# Patient Record
Sex: Female | Born: 1965 | Race: Black or African American | Hispanic: No | Marital: Married | Smoking: Never smoker
Health system: Southern US, Community
[De-identification: ages and names within clinical notes are randomized; demographics above are authoritative.]

## PROBLEM LIST (undated history)

## (undated) ENCOUNTER — Emergency Department (HOSPITAL_COMMUNITY): Admission: EM | Payer: Self-pay | Source: Home / Self Care

## (undated) DIAGNOSIS — J189 Pneumonia, unspecified organism: Secondary | ICD-10-CM

## (undated) DIAGNOSIS — I499 Cardiac arrhythmia, unspecified: Secondary | ICD-10-CM

## (undated) DIAGNOSIS — M069 Rheumatoid arthritis, unspecified: Secondary | ICD-10-CM

## (undated) DIAGNOSIS — R918 Other nonspecific abnormal finding of lung field: Secondary | ICD-10-CM

## (undated) DIAGNOSIS — I10 Essential (primary) hypertension: Secondary | ICD-10-CM

## (undated) DIAGNOSIS — J982 Interstitial emphysema: Secondary | ICD-10-CM

## (undated) DIAGNOSIS — M0520 Rheumatoid vasculitis with rheumatoid arthritis of unspecified site: Secondary | ICD-10-CM

## (undated) DIAGNOSIS — Z8619 Personal history of other infectious and parasitic diseases: Secondary | ICD-10-CM

## (undated) DIAGNOSIS — L568 Other specified acute skin changes due to ultraviolet radiation: Secondary | ICD-10-CM

## (undated) DIAGNOSIS — E042 Nontoxic multinodular goiter: Secondary | ICD-10-CM

## (undated) DIAGNOSIS — L959 Vasculitis limited to the skin, unspecified: Secondary | ICD-10-CM

## (undated) HISTORY — PX: ABDOMINAL HYSTERECTOMY: SHX81

## (undated) HISTORY — DX: Nontoxic multinodular goiter: E04.2

## (undated) HISTORY — DX: Essential (primary) hypertension: I10

## (undated) HISTORY — DX: Personal history of other infectious and parasitic diseases: Z86.19

---

## 1998-07-16 ENCOUNTER — Other Ambulatory Visit: Admission: RE | Admit: 1998-07-16 | Discharge: 1998-07-16 | Payer: Self-pay | Admitting: Obstetrics and Gynecology

## 1999-09-02 ENCOUNTER — Other Ambulatory Visit: Admission: RE | Admit: 1999-09-02 | Discharge: 1999-09-02 | Payer: Self-pay | Admitting: Obstetrics and Gynecology

## 2000-10-30 ENCOUNTER — Other Ambulatory Visit: Admission: RE | Admit: 2000-10-30 | Discharge: 2000-10-30 | Payer: Self-pay | Admitting: Obstetrics and Gynecology

## 2000-11-15 ENCOUNTER — Ambulatory Visit (HOSPITAL_COMMUNITY): Admission: RE | Admit: 2000-11-15 | Discharge: 2000-11-15 | Payer: Self-pay | Admitting: Obstetrics and Gynecology

## 2000-11-15 ENCOUNTER — Encounter: Payer: Self-pay | Admitting: Obstetrics and Gynecology

## 2002-01-21 ENCOUNTER — Other Ambulatory Visit: Admission: RE | Admit: 2002-01-21 | Discharge: 2002-01-21 | Payer: Self-pay | Admitting: Obstetrics and Gynecology

## 2002-07-12 ENCOUNTER — Encounter: Payer: Self-pay | Admitting: Emergency Medicine

## 2002-07-12 ENCOUNTER — Emergency Department (HOSPITAL_COMMUNITY): Admission: EM | Admit: 2002-07-12 | Discharge: 2002-07-12 | Payer: Self-pay | Admitting: Emergency Medicine

## 2002-11-21 ENCOUNTER — Encounter: Payer: Self-pay | Admitting: Internal Medicine

## 2002-11-21 ENCOUNTER — Encounter: Admission: RE | Admit: 2002-11-21 | Discharge: 2002-11-21 | Payer: Self-pay | Admitting: Internal Medicine

## 2003-04-23 ENCOUNTER — Encounter (INDEPENDENT_AMBULATORY_CARE_PROVIDER_SITE_OTHER): Payer: Self-pay | Admitting: Specialist

## 2003-04-23 ENCOUNTER — Ambulatory Visit (HOSPITAL_COMMUNITY): Admission: RE | Admit: 2003-04-23 | Discharge: 2003-04-23 | Payer: Self-pay | Admitting: Obstetrics and Gynecology

## 2003-05-07 ENCOUNTER — Other Ambulatory Visit: Admission: RE | Admit: 2003-05-07 | Discharge: 2003-05-07 | Payer: Self-pay | Admitting: Obstetrics and Gynecology

## 2003-11-25 ENCOUNTER — Encounter: Admission: RE | Admit: 2003-11-25 | Discharge: 2003-11-25 | Payer: Self-pay | Admitting: Gynecology

## 2004-02-18 ENCOUNTER — Ambulatory Visit: Payer: Self-pay | Admitting: Internal Medicine

## 2004-05-18 ENCOUNTER — Ambulatory Visit: Payer: Self-pay | Admitting: Internal Medicine

## 2004-06-03 ENCOUNTER — Other Ambulatory Visit: Admission: RE | Admit: 2004-06-03 | Discharge: 2004-06-03 | Payer: Self-pay | Admitting: Obstetrics and Gynecology

## 2004-09-12 ENCOUNTER — Observation Stay (HOSPITAL_COMMUNITY): Admission: RE | Admit: 2004-09-12 | Discharge: 2004-09-13 | Payer: Self-pay | Admitting: Obstetrics and Gynecology

## 2004-09-12 ENCOUNTER — Encounter (INDEPENDENT_AMBULATORY_CARE_PROVIDER_SITE_OTHER): Payer: Self-pay | Admitting: *Deleted

## 2004-11-30 ENCOUNTER — Encounter: Admission: RE | Admit: 2004-11-30 | Discharge: 2004-11-30 | Payer: Self-pay | Admitting: Obstetrics and Gynecology

## 2005-03-14 ENCOUNTER — Ambulatory Visit: Payer: Self-pay | Admitting: Internal Medicine

## 2005-03-20 ENCOUNTER — Ambulatory Visit: Payer: Self-pay | Admitting: Internal Medicine

## 2005-03-31 ENCOUNTER — Ambulatory Visit (HOSPITAL_COMMUNITY): Admission: RE | Admit: 2005-03-31 | Discharge: 2005-03-31 | Payer: Self-pay | Admitting: Internal Medicine

## 2005-12-05 ENCOUNTER — Encounter: Admission: RE | Admit: 2005-12-05 | Discharge: 2005-12-05 | Payer: Self-pay | Admitting: Obstetrics and Gynecology

## 2006-03-29 ENCOUNTER — Ambulatory Visit: Payer: Self-pay | Admitting: Internal Medicine

## 2006-03-29 LAB — CONVERTED CEMR LAB
AST: 20 units/L (ref 0–37)
Albumin: 3.8 g/dL (ref 3.5–5.2)
Basophils Absolute: 0.1 10*3/uL (ref 0.0–0.1)
Bilirubin, Direct: 0.1 mg/dL (ref 0.0–0.3)
Cholesterol: 124 mg/dL (ref 0–200)
Eosinophils Absolute: 0 10*3/uL (ref 0.0–0.6)
Eosinophils Relative: 0.3 % (ref 0.0–5.0)
GFR calc Af Amer: 102 mL/min
GFR calc non Af Amer: 84 mL/min
Glucose, Bld: 83 mg/dL (ref 70–99)
HCT: 41.8 % (ref 36.0–46.0)
HDL: 42.7 mg/dL (ref >39.0)
Ketones, ur: NEGATIVE mg/dL
LDL Cholesterol: 72 mg/dL (ref 0–99)
Lymphocytes Relative: 22.2 % (ref 12.0–46.0)
MCHC: 35.7 g/dL (ref 30.0–36.0)
MCV: 91.1 fL (ref 78.0–100.0)
Neutro Abs: 6.3 10*3/uL (ref 1.4–7.7)
Neutrophils Relative %: 70.8 % (ref 43.0–77.0)
Nitrite: NEGATIVE
Potassium: 3.8 meq/L (ref 3.5–5.1)
Sodium: 140 meq/L (ref 135–145)
TSH: 0.81 microintl units/mL (ref 0.35–5.50)
Total CHOL/HDL Ratio: 2.9
Urine Glucose: NEGATIVE mg/dL
Urobilinogen, UA: 0.2 (ref 0.0–1.0)
WBC: 8.9 10*3/uL (ref 4.5–10.5)

## 2006-04-04 ENCOUNTER — Ambulatory Visit: Payer: Self-pay | Admitting: Internal Medicine

## 2006-05-11 ENCOUNTER — Ambulatory Visit: Payer: Self-pay | Admitting: Endocrinology

## 2006-05-11 LAB — CONVERTED CEMR LAB
BUN: 10 mg/dL (ref 6–23)
Basophils Relative: 0 % (ref 0.0–1.0)
CO2: 30 meq/L (ref 19–32)
Creatinine, Ser: 0.8 mg/dL (ref 0.4–1.2)
Eosinophils Relative: 0.5 % (ref 0.0–5.0)
Glucose, Bld: 89 mg/dL (ref 70–99)
HCT: 46.2 % — ABNORMAL HIGH (ref 36.0–46.0)
Hemoglobin: 15.7 g/dL — ABNORMAL HIGH (ref 12.0–15.0)
Monocytes Absolute: 0.4 10*3/uL (ref 0.2–0.7)
Monocytes Relative: 5.2 % (ref 3.0–11.0)
Potassium: 3.6 meq/L (ref 3.5–5.1)
RDW: 12.6 % (ref 11.5–14.6)
WBC: 8.6 10*3/uL (ref 4.5–10.5)

## 2006-10-09 ENCOUNTER — Ambulatory Visit: Payer: Self-pay | Admitting: Internal Medicine

## 2006-11-08 ENCOUNTER — Encounter: Payer: Self-pay | Admitting: *Deleted

## 2006-11-08 DIAGNOSIS — I10 Essential (primary) hypertension: Secondary | ICD-10-CM

## 2006-12-10 ENCOUNTER — Encounter: Admission: RE | Admit: 2006-12-10 | Discharge: 2006-12-10 | Payer: Self-pay | Admitting: Obstetrics and Gynecology

## 2007-04-18 ENCOUNTER — Ambulatory Visit: Payer: Self-pay | Admitting: Internal Medicine

## 2007-04-19 LAB — CONVERTED CEMR LAB
ALT: 22 units/L (ref 0–35)
AST: 19 units/L (ref 0–37)
Albumin: 3.8 g/dL (ref 3.5–5.2)
Basophils Absolute: 0 10*3/uL (ref 0.0–0.1)
Calcium: 9 mg/dL (ref 8.4–10.5)
Chloride: 107 meq/L (ref 96–112)
Cholesterol: 146 mg/dL (ref 0–200)
Eosinophils Absolute: 0 10*3/uL (ref 0.0–0.6)
GFR calc non Af Amer: 98 mL/min
HDL: 43.4 mg/dL (ref >39.0)
Hemoglobin, Urine: NEGATIVE
Ketones, ur: NEGATIVE mg/dL
LDL Cholesterol: 95 mg/dL (ref 0–99)
MCHC: 32.7 g/dL (ref 30.0–36.0)
MCV: 93.1 fL (ref 78.0–100.0)
Nitrite: NEGATIVE
Platelets: 209 10*3/uL (ref 150–400)
RBC: 5.03 M/uL (ref 3.87–5.11)
Sodium: 140 meq/L (ref 135–145)
TSH: 0.79 microintl units/mL (ref 0.35–5.50)
Total CHOL/HDL Ratio: 3.4
Urine Glucose: NEGATIVE mg/dL

## 2007-04-24 ENCOUNTER — Ambulatory Visit: Payer: Self-pay | Admitting: Internal Medicine

## 2007-04-24 DIAGNOSIS — E041 Nontoxic single thyroid nodule: Secondary | ICD-10-CM

## 2007-05-03 ENCOUNTER — Encounter: Admission: RE | Admit: 2007-05-03 | Discharge: 2007-05-03 | Payer: Self-pay | Admitting: Internal Medicine

## 2007-06-06 ENCOUNTER — Telehealth: Payer: Self-pay | Admitting: Internal Medicine

## 2007-06-21 ENCOUNTER — Ambulatory Visit: Payer: Self-pay | Admitting: Internal Medicine

## 2007-06-21 DIAGNOSIS — R05 Cough: Secondary | ICD-10-CM

## 2007-10-04 ENCOUNTER — Telehealth: Payer: Self-pay | Admitting: Internal Medicine

## 2007-10-04 ENCOUNTER — Ambulatory Visit: Payer: Self-pay | Admitting: Internal Medicine

## 2007-10-14 LAB — CONVERTED CEMR LAB
Ketones, ur: NEGATIVE mg/dL
Mucus, UA: NEGATIVE
RBC / HPF: NONE SEEN
Specific Gravity, Urine: 1.02 (ref 1.000–1.03)
pH: 6 (ref 5.0–8.0)

## 2007-10-23 ENCOUNTER — Ambulatory Visit: Payer: Self-pay | Admitting: Internal Medicine

## 2007-12-17 ENCOUNTER — Encounter: Admission: RE | Admit: 2007-12-17 | Discharge: 2007-12-17 | Payer: Self-pay | Admitting: Obstetrics and Gynecology

## 2008-04-23 ENCOUNTER — Ambulatory Visit: Payer: Self-pay | Admitting: Internal Medicine

## 2008-04-23 LAB — CONVERTED CEMR LAB
Basophils Absolute: 0 10*3/uL (ref 0.0–0.1)
Basophils Relative: 0.1 % (ref 0.0–3.0)
Bilirubin Urine: NEGATIVE
Bilirubin, Direct: 0.2 mg/dL (ref 0.0–0.3)
Calcium: 8.7 mg/dL (ref 8.4–10.5)
Cholesterol: 135 mg/dL (ref 0–200)
Creatinine, Ser: 0.7 mg/dL (ref 0.4–1.2)
GFR calc Af Amer: 117 mL/min
HCT: 44.3 % (ref 36.0–46.0)
HDL: 37.2 mg/dL — ABNORMAL LOW (ref >39.0)
Hemoglobin: 14.9 g/dL (ref 12.0–15.0)
Ketones, ur: NEGATIVE mg/dL
MCHC: 33.5 g/dL (ref 30.0–36.0)
MCV: 93.2 fL (ref 78.0–100.0)
Monocytes Absolute: 0.4 10*3/uL (ref 0.1–1.0)
Neutro Abs: 6.1 10*3/uL (ref 1.4–7.7)
RDW: 12.9 % (ref 11.5–14.6)
Sodium: 140 meq/L (ref 135–145)
TSH: 0.76 microintl units/mL (ref 0.35–5.50)
Total Bilirubin: 1 mg/dL (ref 0.3–1.2)
Total CHOL/HDL Ratio: 3.6
Total Protein: 6.7 g/dL (ref 6.0–8.3)
Triglycerides: 50 mg/dL (ref 0–149)
Urine Glucose: NEGATIVE mg/dL
pH: 6.5 (ref 5.0–8.0)

## 2008-04-27 ENCOUNTER — Ambulatory Visit: Payer: Self-pay | Admitting: Internal Medicine

## 2008-06-02 ENCOUNTER — Telehealth: Payer: Self-pay | Admitting: Internal Medicine

## 2008-12-17 ENCOUNTER — Encounter: Admission: RE | Admit: 2008-12-17 | Discharge: 2008-12-17 | Payer: Self-pay | Admitting: Obstetrics and Gynecology

## 2009-05-04 ENCOUNTER — Ambulatory Visit: Payer: Self-pay | Admitting: Internal Medicine

## 2009-05-04 LAB — CONVERTED CEMR LAB
ALT: 17 units/L (ref 0–35)
AST: 17 units/L (ref 0–37)
Albumin: 3.9 g/dL (ref 3.5–5.2)
Alkaline Phosphatase: 68 units/L (ref 39–117)
Basophils Absolute: 0.2 10*3/uL — ABNORMAL HIGH (ref 0.0–0.1)
Basophils Relative: 1.9 % (ref 0.0–3.0)
Bilirubin Urine: NEGATIVE
Calcium: 9 mg/dL (ref 8.4–10.5)
Eosinophils Relative: 0.9 % (ref 0.0–5.0)
GFR calc non Af Amer: 116.37 mL/min (ref >60)
Glucose, Bld: 85 mg/dL (ref 70–99)
HCT: 45.4 % (ref 36.0–46.0)
HDL: 46.2 mg/dL (ref >39.00)
Hemoglobin: 15.1 g/dL — ABNORMAL HIGH (ref 12.0–15.0)
Leukocytes, UA: NEGATIVE
Lymphocytes Relative: 18 % (ref 12.0–46.0)
Lymphs Abs: 1.7 10*3/uL (ref 0.7–4.0)
Monocytes Relative: 2.9 % — ABNORMAL LOW (ref 3.0–12.0)
Neutro Abs: 7.4 10*3/uL (ref 1.4–7.7)
Nitrite: NEGATIVE
Potassium: 3.9 meq/L (ref 3.5–5.1)
RBC: 4.82 M/uL (ref 3.87–5.11)
RDW: 12.7 % (ref 11.5–14.6)
Sodium: 138 meq/L (ref 135–145)
Specific Gravity, Urine: >=1.03 (ref 1.000–1.030)
TSH: 0.88 microintl units/mL (ref 0.35–5.50)
Total CHOL/HDL Ratio: 3
Total Protein, Urine: NEGATIVE mg/dL
Total Protein: 7.2 g/dL (ref 6.0–8.3)
pH: 6 (ref 5.0–8.0)

## 2009-05-10 ENCOUNTER — Ambulatory Visit: Payer: Self-pay | Admitting: Internal Medicine

## 2009-05-10 DIAGNOSIS — K5909 Other constipation: Secondary | ICD-10-CM

## 2009-08-12 ENCOUNTER — Telehealth: Payer: Self-pay | Admitting: Internal Medicine

## 2009-09-06 ENCOUNTER — Ambulatory Visit: Payer: Self-pay | Admitting: Internal Medicine

## 2009-09-20 ENCOUNTER — Telehealth: Payer: Self-pay | Admitting: Internal Medicine

## 2009-11-10 ENCOUNTER — Ambulatory Visit: Payer: Self-pay | Admitting: Internal Medicine

## 2009-11-10 DIAGNOSIS — L723 Sebaceous cyst: Secondary | ICD-10-CM

## 2009-11-10 DIAGNOSIS — H9209 Otalgia, unspecified ear: Secondary | ICD-10-CM | POA: Insufficient documentation

## 2010-01-13 ENCOUNTER — Encounter: Admission: RE | Admit: 2010-01-13 | Discharge: 2010-01-13 | Payer: Self-pay | Admitting: Obstetrics and Gynecology

## 2010-03-15 NOTE — Assessment & Plan Note (Signed)
Summary: knot on bck/#/cd   Vital Signs:  Patient profile:   45 year old female Height:      68 inches Weight:      156 pounds BMI:     23.81 Temp:     98.2 degrees F oral Pulse rate:   88 / minute Pulse rhythm:   regular Resp:     16 per minute BP sitting:   130 / 98  (left arm) Cuff size:   regular  Vitals Entered By: Lanier Prude, CMA(AAMA) (November 10, 2009 11:20 AM) CC: Cyst on back/Rt ear pain X 4wks Is Patient Diabetic? No   CC:  Cyst on back/Rt ear pain X 4wks.  History of Present Illness: C/o swollen cyst on L shoulder blade C/o R earache after she used a q tip and injured ear after she turned her head sharply 1-2 wks ago F/u HTN  Current Medications (verified): 1)  Amlodipine Besylate 5 Mg  Tabs (Amlodipine Besylate) .[name].. 1 By Mouth Every Day 2)  Vitamin D3 1000 Unit  Tabs (Cholecalciferol) .[name].. 1 By Mouth Daily 3)  Triamc 0.5% Cream in Eucerin Lotion 1:10 .... Use Two Times A Day Prn 4)  Amitiza 24 Mcg Caps (Lubiprostone) .[name].. 1 By Mouth Once Daily As Needed Constipation 5)  Valtrex 500 Mg Tabs (Valacyclovir Hcl) .[name].. 1 By Mouth Two Times A Day Prn 6)  Symbicort 160-4.5 Mcg/act Aero (Budesonide-Formoterol Fumarate) .... 2 Inh Two Times A Day 7)  Losartan Potassium 100 Mg Tabs (Losartan Potassium) .[name].. 1 By Mouth Once Daily For Blood Pressure  Allergies (verified): 1)  ! 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    Physical Exam  General:  Well-developed,well-nourished,in no acute distress; alert,appropriate and cooperative throughout examination Head:  Normocephalic and atraumatic without obvious abnormalities. No apparent alopecia or  balding. Ears:  wax in R R TM with a healing abrasion at 7 o'clock Nose:  External nasal examination shows no deformity or inflammation. Nasal mucosa are pink and moist without lesions or exudates. Mouth:  Oral mucosa and oropharynx without lesions or exudates.  Teeth in good repair. Lungs:  Normal respiratory effort, chest expands symmetrically. Lungs are clear to auscultation, no crackles or wheezes. Heart:  Normal rate and regular rhythm. S1 and S2 normal without gallop, murmur, click, rub or other extra sounds. Abdomen:  Bowel sounds positive,abdomen soft and non-tender without masses, organomegaly or hernias noted. Msk:  WNL Extremities:  No clubbing, cyanosis, edema, or deformity noted with normal full range of motion of all joints.   Neurologic:  No cranial nerve deficits noted. Station and gait are normal. Plantar reflexes are down-going bilaterally. DTRs are symmetrical throughout. Sensory, motor and coordinative functions appear intact. Skin:  1.5 cm tender seb cyst over L scapula, no d/c Psych:  Cognition and judgment appear intact. Alert and cooperative with normal attention span and concentration. No apparent delusions, illusions, hallucinations   Impression & Recommendations:  Problem # 1:  EAR PAIN (ICD-388.70) R  Assessment New Wax removed w/a loop Her updated medication list for this problem includes:    Ceftin 500 Mg Tabs (Cefuroxime axetil) .[name]... 1 by mouth bid  Problem # 2:  SEBACEOUS CYST, INFECTED (ICD-706.2) Assessment: Deteriorated No need  for I&D now Orders: Surgical Referral (Surgery)  Problem # 3:  HYPERTENSION (ICD-401.9) Assessment: Improved  Her updated medication list for this problem includes:    Amlodipine Besylate 5 Mg Tabs (Amlodipine besylate) .[name]... 1 by mouth every day    Losartan Potassium 100 Mg Tabs (Losartan potassium) .[name]... 1 by mouth once daily for blood pressure  Complete Medication List: 1)  Amlodipine Besylate 5 Mg Tabs (Amlodipine  besylate) .[name].. 1 by mouth every day 2)  Vitamin D3 1000 Unit Tabs (Cholecalciferol) .[name].. 1 by mouth daily 3)  Triamc 0.5% Cream in Eucerin Lotion 1:10  .... Use two times a day prn 4)  Amitiza 24 Mcg Caps (Lubiprostone) .[name].. 1 by mouth once daily as needed constipation 5)  Valtrex 500 Mg Tabs (Valacyclovir hcl) .[name].. 1 by mouth two times a day prn 6)  Symbicort 160-4.5 Mcg/act Aero (Budesonide-formoterol fumarate) .... 2 inh two times a day 7)  Losartan Potassium 100 Mg Tabs (Losartan potassium) .[name].. 1 by mouth once daily for blood pressure 8)  Ceftin 500 Mg Tabs (Cefuroxime axetil) .[name].. 1 by mouth bid  Patient Instructions: 1)  Keep return office visit  Prescriptions: CEFTIN 500 MG TABS (CEFUROXIME AXETIL) 1 by mouth bid  #20 x 1   Entered and Authorized by:   Tresa Garter MD   Signed by:   Tresa Garter MD on 11/10/2009   Method used:   Electronically to        [facility]* (retail)       [address]       [place], [place]  [address]       Ph: [phone]       Fax: [phone]   RxID:   [id number]

## 2010-03-15 NOTE — Progress Notes (Signed)
Summary: HCTZ--lm 8-9, 8-10, 8-12  Phone Note Other Incoming   Caller: pt Summary of Call: Pt called stating that her HCTZ medication is making her dizzy. Please Advise another alternative for this pt.  Initial call taken by: Ami Bullins CMA,  September 20, 2009 9:19 AM  Follow-up for Phone Call        D/c HCTZ Start Losartan 100 mg: take 1/2 tab x 1 wk then 1 qd Follow-up by: Tresa Garter MD,  September 21, 2009 7:55 AM  Additional Follow-up for Phone Call Additional follow up Details #1::        left detailed vm on hm #, also req she call back w/confirmation that she understands Additional Follow-up by: Lamar Sprinkles, CMA,  September 21, 2009 9:55 AM   New Allergies: HYDROCHLOROTHIAZIDE Additional Follow-up for Phone Call Additional follow up Details #2::    left mess for female at pt's home to have pt call back tomorrow...........Marland KitchenLamar Sprinkles, CMA  September 22, 2009 5:45 PM   left message on machine to call back to office. Lucious Groves CMA  September 24, 2009 8:43 AM   Additional Follow-up for Phone Call Additional follow up Details #3:: Details for Additional Follow-up Action Taken: Per pharmacy pt picked up Rx 09/20/2009 and was instructed by pharmacist to slowly increase to 1 by mouth once daily. Pharmacist advised of this because pt was unsure and this was not included on directions. Pharmacy was advised that several messages were left for pt to call back to be instructed of same. Additional Follow-up by: Margaret Pyle, CMA,  September 24, 2009 3:52 PM  New/Updated Medications: LOSARTAN POTASSIUM 100 MG TABS (LOSARTAN POTASSIUM) 1 by mouth once daily for blood pressure New Allergies: HYDROCHLOROTHIAZIDEPrescriptions: LOSARTAN POTASSIUM 100 MG TABS (LOSARTAN POTASSIUM) 1 by mouth once daily for blood pressure  #30 x 12   Entered and Authorized by:   Tresa Garter MD   Signed by:   Tresa Garter MD on 09/21/2009   Method used:   Electronically to        [facility]* (retail)       [address]       [place], [place]  [address]       Ph: [phone]       Fax: [phone]   RxID:   [id number]

## 2010-03-15 NOTE — Progress Notes (Signed)
Summary: REFILL  Phone Note Call from Patient Call back at [phone]   Summary of Call: pt left message on triage asking if she can get a refill for promethazine codeine cough syrup or if she needs to make an OV to come in--please advise--pt states to call her back at number listed above or [phone] after 2pm Initial call taken by: Brenton Grills MA,  August 12, 2009 1:29 PM  Follow-up for Phone Call        ok to ref OV if sick Follow-up by: Tresa Garter MD,  August 13, 2009 4:08 PM  Additional Follow-up for Phone Call Additional follow up Details #1::        Pt informed  Additional Follow-up by: Lamar Sprinkles, CMA,  August 13, 2009 4:31 PM    New/Updated Medications: PROMETHAZINE-CODEINE 6.25-10 MG/5ML SYRP (PROMETHAZINE-CODEINE) 5 to 10 mls every 4 to 6 hrs as needed cough Prescriptions: PROMETHAZINE-CODEINE 6.25-10 MG/5ML SYRP (PROMETHAZINE-CODEINE) 5 to 10 mls every 4 to 6 hrs as needed cough  #300 x 0   Entered by:   Lamar Sprinkles, CMA   Authorized by:   Tresa Garter MD   Signed by:   Lamar Sprinkles, CMA on 08/13/2009   Method used:   Telephoned to ...       [facility]* (retail)       [address]       [place], [place]  [address]       Ph: [phone]       Fax: [phone]   RxID:   [id number]

## 2010-03-15 NOTE — Assessment & Plan Note (Signed)
Summary: PHYSICAL---STC  #   Vital Signs:  Patient profile:   45 year old female Height:      68 inches Weight:      159 pounds BMI:     24.26 Temp:     97.1 degrees F oral Pulse rate:   97 / minute BP sitting:   130 / 92  (left arm)  Vitals Entered By: Tora Perches (May 10, 2009 2:05 PM) CC: cpx Is Patient Diabetic? No   CC:  cpx.  History of Present Illness: The patient presents for a wellness examination   Preventive Screening-Counseling & Management  Alcohol-Tobacco     Smoking Status: never  Current Medications (verified): 1)  Amlodipine Besylate 5 Mg  Tabs (Amlodipine Besylate) .[name].. 1 By Mouth Every Day 2)  Vitamin D3 1000 Unit  Tabs (Cholecalciferol) .[name].. 1 By Mouth Daily 3)  Spironolactone-Hctz 25-25 Mg Tabs (Spironolactone-Hctz) .[name].. 1 By Mouth Qd 4)  Advair Diskus 100-50 Mcg/dose Misc (Fluticasone-Salmeterol) .[name].. 1 Puff 2 Times Daily 5)  Triamc 0.5% Cream in Eucerin Lotion 1:10 .... Use Two Times A Day Prn  Allergies: 1)  ! Cipro 2)  Benazepril Hcl (Benazepril Hcl)  Past History:  Past Medical History: Last updated: 04/24/2007 Hypertension Cold sores Thyroid nodules 2007 GYN Dr Henderson Cloud  Past Surgical History: Last updated: 04/24/2007 Hysterectomy partial  Family History: Last updated: 04/24/2007 Family History Hypertension  Social History: Last updated: 05/10/2009 Occupation:hospital Married Never Smoked Regular exercise-no  Social History: Occupation:hospital Married Never Smoked Regular exercise-no  Review of Systems  The patient denies anorexia, fever, weight loss, weight gain, vision loss, decreased hearing, hoarseness, chest pain, syncope, dyspnea on exertion, peripheral edema, prolonged cough, headaches, hemoptysis, abdominal pain, melena, hematochezia, severe indigestion/heartburn, hematuria, incontinence, genital sores, muscle weakness, suspicious skin lesions, transient blindness, difficulty walking, depression, unusual  weight change, abnormal bleeding, enlarged lymph nodes, angioedema, and breast masses.    Physical Exam  General:  Well-developed,well-nourished,in no acute distress; alert,appropriate and cooperative throughout examination Head:  Normocephalic and atraumatic without obvious abnormalities. No apparent alopecia or balding. Eyes:  No corneal or conjunctival inflammation noted. EOMI. Perrla. Funduscopic exam benign, without hemorrhages, exudates or papilledema. Vision grossly normal. Ears:  External ear exam shows no significant lesions or deformities.  Otoscopic examination reveals clear canals, tympanic membranes are intact bilaterally without bulging, retraction, inflammation or discharge. Hearing is grossly normal bilaterally. Nose:  External nasal examination shows no deformity or inflammation. Nasal mucosa are pink and moist without lesions or exudates. Mouth:  Oral mucosa and oropharynx without lesions or exudates.  Teeth in good repair. Neck:  R lobe of thyroid  Lungs:  Normal respiratory effort, chest expands symmetrically. Lungs are clear to auscultation, no crackles or wheezes. Heart:  Normal rate and regular rhythm. S1 and S2 normal without gallop, murmur, click, rub or other extra sounds. Abdomen:  Bowel sounds positive,abdomen soft and non-tender without masses, organomegaly or hernias noted. Msk:  WNL Pulses:  R and L carotid,radial,femoral,dorsalis pedis and posterior tibial pulses are full and equal bilaterally Extremities:  No clubbing, cyanosis, edema, or deformity noted with normal full range of motion of all joints.   Neurologic:  No cranial nerve deficits noted. Station and gait are normal. Plantar reflexes are down-going bilaterally. DTRs are symmetrical throughout. Sensory, motor and coordinative functions appear intact. Skin:  Intact without suspicious lesions or rashes Cervical Nodes:  No lymphadenopathy noted Inguinal Nodes:  No significant adenopathy Psych:  Cognition  and judgment appear intact. Alert and cooperative with  normal attention span and concentration. No apparent delusions, illusions, hallucinations   Impression & Recommendations:  Problem # 1:  ROUTINE GENERAL MEDICAL EXAM@HEALTH  CARE FACL (ICD-V70.0) Assessment New Health and age related issues were discussed. Available screening tests and vaccinations were discussed as well. Healthy life style including good diet and execise was discussed. The labs were reviewed with the patient.  Orders: EKG w/ Interpretation (93000)  Problem # 2:  Cold sore Assessment: Comment Only Valtrex  Problem # 3:  CONSTIPATION, CHRONIC (ICD-564.09) Assessment: Unchanged Amitiza See "Patient Instructions".   Complete Medication List: 1)  Amlodipine Besylate 5 Mg Tabs (Amlodipine besylate) .[name].. 1 by mouth every day 2)  Vitamin D3 1000 Unit Tabs (Cholecalciferol) .[name].. 1 by mouth daily 3)  Spironolactone-hctz 25-25 Mg Tabs (Spironolactone-hctz) .[name].. 1 by mouth qd 4)  Advair Diskus 100-50 Mcg/dose Misc (Fluticasone-salmeterol) .[name].. 1 puff 2 times daily 5)  Triamc 0.5% Cream in Eucerin Lotion 1:10  .... Use two times a day prn 6)  Amitiza 24 Mcg Caps (Lubiprostone) .[name].. 1 by mouth once daily as needed constipation 7)  Valtrex 500 Mg Tabs (Valacyclovir hcl) .[name].. 1 by mouth two times a day prn  Patient Instructions: 1)  Please schedule a follow-up appointment in 1 year. 2)  Try to eat more raw plant food, fresh and dry fruit, raw almonds, leafy vegetables, whole foods and less red meat, less animal fat. Poultry and fish is better for you than pork and beef. Avoid processed foods (canned soups, hot dogs, sausage, bacon , frozen dinners). Avoid corn syrup, high fructose syrup or aspartam and Splenda  containing drinks. Honey, Agave and Stevia are better sweeteners. Make your own  dressing with olive oil, wine vinegar, lemon juce, garlic etc. for your salads. 3)  Start taking a yoga or pilates yoga class 4)  It is  important that you exercise regularly at least 20 minutes 5 times a week. If you develop chest pain, have severe difficulty breathing, or feel very tired , stop exercising immediately and seek medical attention. Prescriptions: VALTREX 500 MG TABS (VALACYCLOVIR HCL) 1 by mouth two times a day prn  #14 x 3   Entered and Authorized by:   Tresa Garter MD   Signed by:   Tresa Garter MD on 05/10/2009   Method used:   Print then Give to Patient   RxID:   [id number] AMITIZA 24 MCG CAPS (LUBIPROSTONE) 1 by mouth once daily as needed constipation  #90 x 1   Entered and Authorized by:   Tresa Garter MD   Signed by:   Tresa Garter MD on 05/10/2009   Method used:   Print then Give to Patient   RxID:   [id number] ADVAIR DISKUS 100-50 MCG/DOSE MISC (FLUTICASONE-SALMETEROL) 1 puff 2 times daily  #3 x 3   Entered and Authorized by:   Tresa Garter MD   Signed by:   Tresa Garter MD on 05/10/2009   Method used:   Print then Give to Patient   RxID:   [id number] SPIRONOLACTONE-HCTZ 25-25 MG TABS (SPIRONOLACTONE-HCTZ) 1 by mouth qd  #90 x 3   Entered and Authorized by:   Tresa Garter MD   Signed by:   Tresa Garter MD on 05/10/2009   Method used:   Print then Give to Patient   RxID:   [id number] VITAMIN D3 1000 UNIT  TABS (CHOLECALCIFEROL) 1 by mouth daily  #100 x 3   Entered and Authorized by:  Tresa Garter MD   Signed by:   Tresa Garter MD on 05/10/2009   Method used:   Print then Give to Patient   RxID:   [id number] AMLODIPINE BESYLATE 5 MG  TABS (AMLODIPINE BESYLATE) 1 by mouth every day  #90 x 3   Entered and Authorized by:   Tresa Garter MD   Signed by:   Tresa Garter MD on 05/10/2009   Method used:   Print then Give to Patient   RxID:   [id number]

## 2010-03-15 NOTE — Assessment & Plan Note (Signed)
Summary: COUGH---STC   Vital Signs:  Patient profile:   45 year old female Height:      68 inches Weight:      155 pounds BMI:     23.65 O2 Sat:      98 % on Room air Temp:     98.1 degrees F oral Pulse rate:   97 / minute Pulse rhythm:   regular Resp:     16 per minute BP sitting:   150 / 110  (left arm) Cuff size:   regular  Vitals Entered By: Lanier Prude, CMA(AAMA) (September 06, 2009 3:57 PM)  O2 Flow:  Room air Comments pt states she called 2 wks ago for this appt and had real bad cough at the time.  It has improved some but is still lingering   History of Present Illness: F/u HTN, cough  Current Medications (verified): 1)  Amlodipine Besylate 5 Mg  Tabs (Amlodipine Besylate) .[name].. 1 By Mouth Every Day 2)  Vitamin D3 1000 Unit  Tabs (Cholecalciferol) .[name].. 1 By Mouth Daily 3)  Spironolactone-Hctz 25-25 Mg Tabs (Spironolactone-Hctz) .[name].. 1 By Mouth Qd 4)  Advair Diskus 100-50 Mcg/dose Misc (Fluticasone-Salmeterol) .[name].. 1 Puff 2 Times Daily 5)  Triamc 0.5% Cream in Eucerin Lotion 1:10 .... Use Two Times A Day Prn 6)  Amitiza 24 Mcg Caps (Lubiprostone) .[name].. 1 By Mouth Once Daily As Needed Constipation 7)  Valtrex 500 Mg Tabs (Valacyclovir Hcl) .[name].. 1 By Mouth Two Times A Day Prn 8)  Promethazine-Codeine 6.25-10 Mg/71ml Syrp (Promethazine-Codeine) .... 5 To 10 Mls Every 4 To 6 Hrs As Needed Cough  Allergies (verified): 1)  ! 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    Physical Exam  General:  Well-developed,well-nourished,in no acute distress; alert,appropriate and cooperative throughout examination Mouth:  Oral mucosa and oropharynx without lesions or  exudates.  Teeth in good repair. Lungs:  Normal respiratory effort, chest expands symmetrically. Lungs are clear to auscultation, no crackles or wheezes. Heart:  Normal rate and regular rhythm. S1 and S2 normal without gallop, murmur, click, rub or other extra sounds.   Impression & Recommendations:  Problem # 1:  HYPERTENSION (ICD-401.9) Assessment Deteriorated  Her updated medication list for this problem includes:    Amlodipine Besylate 5 Mg Tabs (Amlodipine besylate) .[name]... 1 by mouth every day    Spironolactone-hctz 25-25 Mg Tabs (Spironolactone-hctz) .[name]... 1 by mouth once daily - pls restart Risks of noncompliance with treatment discussed. Compliance encouraged.  Problem # 2:  COUGH (ICD-786.2) resolved Assessment: Improved MDI as needed if flare up  Complete Medication List: 1)  Amlodipine Besylate 5 Mg Tabs (Amlodipine besylate) .[name].. 1 by mouth every day 2)  Vitamin D3 1000 Unit Tabs (Cholecalciferol) .[name].. 1 by mouth daily 3)  Spironolactone-hctz 25-25 Mg Tabs (Spironolactone-hctz) .[name].. 1 by mouth qd 4)  Triamc 0.5% Cream in Eucerin Lotion 1:10  .... Use two times a day prn 5)  Amitiza 24 Mcg Caps (Lubiprostone) .[name].. 1 by mouth once daily as needed constipation 6)  Valtrex 500 Mg Tabs (Valacyclovir hcl) .[name].. 1 by mouth two times a day prn 7)  Symbicort 160-4.5 Mcg/act Aero (Budesonide-formoterol fumarate) .... 2 inh two times a day  Patient Instructions: 1)  Please schedule a follow-up appointment in 6 months. 2)  Call if BP is up (NL BP<130/85) 3)  BMP prior to visit, ICD-9: 401.1 Prescriptions: SPIRONOLACTONE-HCTZ 25-25 MG TABS (SPIRONOLACTONE-HCTZ) 1 by mouth qd  #90 x 3   Entered and Authorized by:   Tresa Garter MD   Signed by:   Tresa Garter MD on 09/06/2009   Method used:   Print then Give to Patient   RxID:   [id number] AMLODIPINE BESYLATE 5 MG  TABS (AMLODIPINE BESYLATE) 1 by mouth every day  #90 x 3   Entered and Authorized by:   Tresa Garter MD   Signed by:   Tresa Garter MD on 09/06/2009   Method used:   Print then Give to Patient   RxID:   [id number]

## 2010-04-13 ENCOUNTER — Telehealth: Payer: Self-pay | Admitting: Internal Medicine

## 2010-04-21 NOTE — Progress Notes (Signed)
  Phone Note Call from Patient   Summary of Call: URI Initial call taken by: Tresa Garter MD,  April 13, 2010 1:08 PM  Follow-up for Phone Call        Zpac Use over-the-counter medicines for "cold": Tylenol  650mg  or Advil 400mg  every 6 hours  for fever; Delsym or Robutussin for cough. Mucinex  for congestion. Ricola or Halls for sore throat. Office visit if not better or if worse.  Follow-up by: Tresa Garter MD,  April 13, 2010 1:10 PM  Additional Follow-up for Phone Call Additional follow up Details #1::        Called pt to notify per MD/LMVOM to check with pharmacyand call if needed.Alvy Beal Archie CMA  April 13, 2010 4:06 PM     New/Updated Medications: ZITHROMAX Z-PAK 250 MG TABS (AZITHROMYCIN) as dirrected Prescriptions: ZITHROMAX Z-PAK 250 MG TABS (AZITHROMYCIN) as dirrected  #1 x 0   Entered and Authorized by:   Tresa Garter MD   Signed by:   Rock Nephew CMA on 04/13/2010   Method used:   Electronically to        [facility]* (retail)       [address]       [place], [place]  [address]       Ph: [phone]       Fax: [phone]   RxID:   [id number]

## 2010-05-21 ENCOUNTER — Other Ambulatory Visit: Payer: Self-pay | Admitting: Internal Medicine

## 2010-06-07 ENCOUNTER — Ambulatory Visit (INDEPENDENT_AMBULATORY_CARE_PROVIDER_SITE_OTHER): Payer: 59 | Admitting: Internal Medicine

## 2010-06-07 ENCOUNTER — Encounter: Payer: Self-pay | Admitting: Internal Medicine

## 2010-06-07 DIAGNOSIS — H9209 Otalgia, unspecified ear: Secondary | ICD-10-CM

## 2010-06-07 DIAGNOSIS — R51 Headache: Secondary | ICD-10-CM | POA: Insufficient documentation

## 2010-06-07 DIAGNOSIS — R519 Headache, unspecified: Secondary | ICD-10-CM | POA: Insufficient documentation

## 2010-06-07 MED ORDER — AZITHROMYCIN 250 MG PO TABS: ORAL_TABLET | ORAL | Status: AC

## 2010-06-07 MED ORDER — IBUPROFEN 600 MG PO TABS: ORAL_TABLET | ORAL | Status: AC

## 2010-06-07 NOTE — Assessment & Plan Note (Signed)
Zpac 

## 2010-06-07 NOTE — Progress Notes (Signed)
  Subjective:    Patient ID: Jamie [name], female    DOB: 07/05/1965, 45 y.o.   MRN: [id number]  HPI   HPI  C/o URI sx's since [place]. No ST, cough, weakness. Not better with OTC medicines. Actually, the patient is getting worse.C/o R sided HA. R side of nose is burning. Eye and teeth do not hurt. Advil helped. No d/c Review of Systems  Constitutional: Neg for fever, chills and fatigue.  HENT: Positive for congestion, rhinorrhea, sneezing and postnasal drip.   Eyes: Positive for photophobia and pain. Negative for discharge and visual disturbance.  Respiratory: Positive for cough, shortness of breath and wheezing.   Cardiovascular: Positive for chest pain.  Gastrointestinal: Negative for vomiting, abdominal pain, diarrhea and abdominal distention.  Genitourinary: Negative for dysuria and difficulty urinating.  Skin: Negative for rash.  Neurological: Positive for dizziness, weakness and light-headedness.      Review of Systems     Objective:   Physical Exam  Constitutional: She appears well-developed and well-nourished. No distress.  HENT:  Head: Normocephalic.  Right Ear: External ear normal.  Left Ear: External ear normal.  Nose: Nose normal.  Mouth/Throat: Oropharynx is clear and moist.  Eyes: Conjunctivae are normal. Pupils are equal, round, and reactive to light. Right eye exhibits no discharge. Left eye exhibits no discharge.       R TM red R cheek is tender  Neck: Normal range of motion. Neck supple. No JVD present. No tracheal deviation present. No thyromegaly present.  Cardiovascular: Normal rate, regular rhythm and normal heart sounds.   Pulmonary/Chest: No stridor. No respiratory distress. She has no wheezes.  Abdominal: Soft. Bowel sounds are normal. She exhibits no distension and no mass. There is no tenderness. There is no rebound and no guarding.  Musculoskeletal: She exhibits no edema and no tenderness.  Lymphadenopathy:    She has no cervical adenopathy.    Neurological: She displays normal reflexes. No cranial nerve deficit. She exhibits normal muscle tone. Coordination normal.  Skin: No rash noted. No erythema.  Psychiatric: She has a normal mood and affect. Her behavior is normal. Judgment and thought content normal.          Assessment & Plan:  EAR PAIN Zpac

## 2010-07-01 NOTE — Op Note (Signed)
NAME:  Jamie [name], Jamie [name]                         ACCOUNT NO.:  [id number]   MEDICAL RECORD NO.:  [id number]                   PATIENT TYPE:  AMB   LOCATION:  SDC                                  FACILITY:  [facility]   PHYSICIAN:  Guy Sandifer. Arleta Creek, M.D.           DATE OF BIRTH:  1966-01-09   DATE OF PROCEDURE:  04/23/2003  DATE OF DISCHARGE:                                 OPERATIVE REPORT   PREOPERATIVE DIAGNOSIS:  1. Dysmenorrhea.  2. Menometrorrhagia.   POSTOPERATIVE DIAGNOSIS:  1. Endometriosis.  2. Endometrial polyps.   PROCEDURE:  Laparoscopy with ablation of endometriosis, hysteroscopy with  resectoscope and resection of endometrial polyps, dilation and curettage.   SURGEON:  Guy Sandifer. Henderson Cloud, M.D.   ANESTHESIA:  General with endotracheal intubation.   ESTIMATED BLOOD LOSS:  50 mL.   INPUTS AND OUTPUTS:  Sorbitol distending media, 200 mL deficit, with at  least 100 mL of that on the floor.   SPECIMENS:  1. Endometrial mass.  2. Endometrial mass.  3. Endometrial curettings.   INDICATIONS AND CONSENT:  The patient is a 45 year old married black female,  G2, P1, with dysmenorrhea and heavy irregular bleeding.  Details are  dictated in the history and physical.  Laparoscopy, hysteroscopy with  resectoscope, and dilation and curettage is discussed with the patient  preoperatively.  The potential risks and complications have been reviewed  preoperatively including but not limited to infection, uterine perforation,  bowel, bladder or ureteral damage, bleeding requiring transfusion of blood  products with possible transfusion reaction, HIV, and hepatitis acquisition,  DVT, PE, pneumonia, recurrent pain and/or irregular bleeding, hysterectomy,  and laparotomy.  All questions have been answered and consent is signed on  the chart.   FINDINGS:  In the intrauterine cavity are multiple polypoid type masses  approximately 1 to 1.5 cm.  The uterus is normal in size and contour.   The  anterior cul-de-sac and posterior cul-de-sac are normal.  There is a pseudo-  window lateral to the right uterosacral ligament.  The right ovary and tube  are normal.  There is a black implant of endometriosis of the left upper  pelvic sidewall and a small brown implant of endometriosis of the left  ovary.  The left fallopian tube is normal.  There are also some very filmy  adhesions of the anterior cul-de-sac which go down with just gentle blunt  dissection.   PROCEDURE:  The patient was taken to the operating room where she was  identified, placed in the dorsal supine position, and general anesthesia was  induced with endotracheal intubation.  She was then prepped abdominally and  vaginally.  Her bladder was straight catheterized.  She was draped in a  sterile fashion.  Bivalve speculum was placed.  The anterior cervical lip  was injected with 1% Xylocaine and grasped with a single tooth tenaculum.  Paracervical and paracervical block was placed at the 2, 4,  5, 7, 8, and 10  o'clock positions with approximately 20 mL total of 1% plain Xylocaine.  The  cervix was gently progressively dilated to a 33 Pratt dilator and a  resectoscope with a single right angle wire loop is placed in the  endocervical canal and advanced under direct visualization using Sorbitol  distending media.  The above findings were noted.  The polyps were resected  in a simple fashion without difficulty.  Sharp curettage was carried out.  The cavity is clean.  The single tooth tenaculum is replaced with the Hulka  tenaculum.   Attention was turned to the abdomen.  A small infraumbilical incision was  made and a Veress needle placed and the syringe and drop test were normal.  The abdominal cavity was insufflated with approximately 2 liters of gas low  flow with good tympany in the right upper quadrant.  The Veress needle was  removed and the 10/11 disposable trocar sleeve was placed on the first  attempt without  difficulty.  Placement was verified with the laparoscope and  no damage to the surrounding structures was noted.  A small suprapubic  incision was made and a 5 mm disposable trocar sleeve was placed under  direct visualization without difficulty.  The above findings were noted.  The course of the ureters were identified bilaterally.  The areas of  endometriosis on the left pelvic sidewall was ablated with bipolar cautery  as well as on the left ovary.  A small implant of endometriosis on the left  pelvic sidewall was ablated with bipolar cautery as well as on the left  ovary.  A small implant of endometriosis in the pseudowindow on the right  pelvic sidewall was also ablated.  Copious irrigation was carried out  throughout this process.  There was also an incidental finding of a 1.5 cm  corpus luteum type cyst on the left ovary which is punctured with only a  couple of drops of fluid obtained.  Therefore, this was ablated with bipolar  cautery.  The suprapubic trocar sleeve was removed and the peritoneum was  reduced and good hemostasis is noted all around.  The Pneumoperitoneum is  completely reduced and the umbilical trocar sleeve is removed.  The  umbilicus is closed with 2-0 Vicryl sutures in the subcutaneous layers with  care being taken not to pick up any underlying structures.  Both incisions  were injected with 0.5% plain Marcaine and are closed with Dermabond.  The  Hulka tenaculum is removed and no bleeding is noted.  All counts were  correct.  The patient was awakened and taken to the recovery room in stable  condition.                                               Guy Sandifer Arleta Creek, M.D.    JET/MEDQ  D:  04/23/2003  T:  04/23/2003  Job:  [id number]

## 2010-07-01 NOTE — Assessment & Plan Note (Signed)
[facility]                           PRIMARY CARE OFFICE NOTE   NAME:Cassin, ADDALEIGH NICHOLLS                      MRN:          [id number]  DATE:04/04/2006                            DOB:          [birth year]    The patient is a 45 year old female who presents for a wellness  examination.   PAST MEDICAL HISTORY/FAMILY HISTORY/SOCIAL HISTORY:  As per March 20, 2005 note.   CURRENT MEDICATIONS:  Reviewed with the patient.   REVIEW OF SYSTEMS:  No chest pain or shortness of breath.  Blood  pressure is well controlled.  Has been doing well.  She has been losing  weight in [employer] with [facility] system.  Occasional cold  sores.  Emotionally doing fine.  Occasionally constipated.  The rest of  the 18-point review of systems is negative.   PHYSICAL:  Blood pressure 140/98, pulse 90, temperature 97.3.  Weight  150.  She looks well.  She is in no acute distress.  HEENT:  Moist mucosa.  NECK:  Supple.  No thyromegaly or bruit.  LUNGS:  Clear to auscultation and percussion.  No wheeze or rales.  HEART:  S1, S2.  No murmur.  No gallop.  ABDOMEN:  Soft, nontender, no organomegaly or mass felt.  LOWER EXTREMITIES:  Without edema.  She is alert and talkative.  Denies being depressed.   LABS:  From March 29, 2006, CBC normal, CMET normal.  Cholesterol  normal TSH.  Urinalysis all normal.  EKG today with normal sinus rhythm.   ASSESSMENT AND PLAN:  1. Normal wellness examination.  Age/health issues discussed.  Healthy      life-style discussed.  Regular gynecologic care/mammogram defer to      gynecologist.  2. Hypertension.  Continue Norvasc.  3. Herpes simplex (cold sores).  Valtrex 500 p.o. b.i.d. for 5 days      p.r.n.  4. Depressed mood/anxiety.  Doing well on Zoloft 25 mg 1 half daily.  5. Occasional constipation, possibly medication related.  Given      Amitiza 1 to 2 daily p.r.n.     Georgina Quint. Plotnikov, MD  Electronically  Signed    AVP/MedQ  DD: 04/05/2006  DT: 04/05/2006  Job #: [id number]

## 2010-07-01 NOTE — Discharge Summary (Signed)
NAMECHARMAYNE, Jamie [name]               ACCOUNT NO.:  [id number]   MEDICAL RECORD NO.:  [id number]          PATIENT TYPE:  OBV   LOCATION:  9320                          FACILITY:  [facility]   PHYSICIAN:  Guy Sandifer. Henderson Cloud, M.D. DATE OF BIRTH:  [birth year]   DATE OF ADMISSION:  09/12/2004  DATE OF DISCHARGE:  09/13/2004                                 DISCHARGE SUMMARY   ADMITTING DIAGNOSES:  1.  Menorrhagia.  2.  Pelvic pain.   DISCHARGE DIAGNOSES:  1.  Menorrhagia.  2.  Endometriosis.   PROCEDURE:  On September 12, 2004, a laparoscopically-assisted vaginal  hysterectomy with ablation of endometriosis.   REASON FOR ADMISSION:  The patient is 45 year old, married, black female,  G2, P1 with a known endometriosis, having recurrent pain and heavy menses.  Details are dictated in the history and physical.  She is admitted for  surgical management.   HOSPITAL COURSE:  The patient is taken to operating room, undergoes the  above procedure.  On the evening of surgery, she has good pain control,  vital signs are stable, and she is afebrile.  On the day of discharge, she  is passing flatus, ambulating well, has good pain relief, and is tolerating  a regular diet.  Vital are signs stable and she is afebrile. Hemoglobin is  11.7 white count 15.7, and pathology is pending.   CONDITION ON DISCHARGE:  Good.   DIET:  Regular as tolerated.   ACTIVITY:  No lifting, no operation of automobiles, no vaginal entry.   She is to call the office for problems including, but not limited to, a  temperature of 101 degrees, persistent nausea vomiting, increasing pain, or  heavy vaginal bleeding   MEDICATIONS:  1.  Percocet 5/325 mg, #30, one to two p.o. q.6 h p.r.n.  2.  Ibuprofen 600 mg q.6 h p.r.n.   FOLLOW-UP:  In the office in 2 weeks.      Guy Sandifer Henderson Cloud, M.D.  Electronically Signed     JET/MEDQ  D:  09/13/2004  T:  09/13/2004  Job:  [id number]

## 2010-07-01 NOTE — Op Note (Signed)
NAMEALVAH, GILDER NO.:  [id number]   MEDICAL RECORD NO.:  [id number]          PATIENT TYPE:  OBV   LOCATION:  9399                          FACILITY:  [facility]   PHYSICIAN:  Guy Sandifer. Henderson Cloud, M.D. DATE OF BIRTH:  05-21-1965   DATE OF PROCEDURE:  09/12/2004  DATE OF DISCHARGE:                                 OPERATIVE REPORT   PREOPERATIVE DIAGNOSES:  1.  Menorrhagia.  2.  Pelvic pain.   POSTOPERATIVE DIAGNOSES:  1.  Menorrhagia.  2.  Endometriosis.   PROCEDURE:  Laparoscopically-assisted vaginal hysterectomy with ablation of  endometriosis.   SURGEON:  Harold Hedge, MD   ASSISTANT:  Candice Camp, MD   ANESTHESIA:  General with endotracheal intubation.   ESTIMATED BLOOD LOSS:  150 mL.   SPECIMENS:  Uterus.   INDICATIONS AND CONSENT:  This patient is a 45 year old, married, black  female, G2, P1, with increasingly symptomatic menses and pelvic pain.  Details are dictated in the history and physical.  Laparoscopically-assisted  vaginal hysterectomy with removal of tubes and ovaries only if abnormal have  been discussed with the patient.  Potential risks and complications have  been discussed including but not limited to infection; bowel, bladder,  ureteral damage; bleeding requiring transfusion of blood products with  possible transfusion reaction; HIV and hepatitis acquisition; DVT, PE,  pneumonia, laparotomy, fistula formation, recurrent pelvic pain, and  dyspareunia.  All questions have been answered, and consent is signed on the  chart.   FINDINGS:  Upper abdomen is grossly normal.  Uterus is about 6 weeks in  size.  Anterior cul-de-sac is normal.  Tubes and ovaries are normal  bilaterally.  There is a black implant of endometriosis on the left pelvic  sidewall and some white puckered areas in a pseudowindow in the posterior  cul-de-sac consistent with endometriosis.   DESCRIPTION OF PROCEDURE:  The patient is taken to the operating room where  she is identified, placed in the dorsal supine position, and general  anesthesia is induced via endotracheal intubation.  She is then placed in  the dorsal lithotomy position where she is prepped abdominally and  vaginally.  Bladder is straight-catheterized; Hulka tenaculum is placed in  the uterus as a manipulator, and she is draped in a sterile fashion.  A  small infraumbilical incision is made, and the disposable Veress needle is  placed with a normal syringe and drop test.  Two liters of gas are  insufflated under low pressure.  Veress needle is removed, and the  disposable bladeless trocar sleeve is placed using the direct visualization  method with the laparoscope without difficulty.  A small, suprapubic  incision is made, and the 5 mm disposable bladeless trocar sleeve is placed  under direct visualization without difficulty.  The above findings are  noted. Areas of endometriosis are cauterized with bipolar cautery.  This is  clear of the ureters.  Proximal ligaments are then taken down to the level  of the vesicouterine peritoneum bilaterally.  The vesicouterine peritoneum  is incised in the midline, hydrodissected, taken down cephalolaterally.  The  suprapubic trocar sleeve  is removed, and attention is turned to the vagina.  Posterior cul-de-sac is sharply entered, and the cervix is circumscribed.  Vaginal mucosa is advanced sharply and bluntly.  Uterosacral ligaments are  taken down with the Gyrus bipolar instrument.  It should be noted the Gyrus  bipolar cautery cutting instrument was used abdominally.  Progressive bites  of the uterosacral ligaments followed by the bladder pillars, cardinal  ligaments, and uterine vessels are taken bilaterally.  Fundus is delivered  posteriorly.  Proximal ligaments are taken down.  The specimen is delivered.  Uterosacral ligaments are plicated to the vaginal cuff bilaterally with 0  Monocryl suture.  All suture will be 0 Monocryl unless otherwise  designated.  Uterosacral ligaments are then plicated in the midline with a separate  suture.  Cuff is closed with figure-of-eights.  Foley catheter is placed in  the bladder, and clear urine is noted.  Attention is returned to the  abdomen.  Pneumoperitoneum is reintroduced, and the 5 mm bladeless trocar  sleeve is reinserted suprapubically under direct visualization.  Careful  inspection and copious irrigation reveals excellent hemostasis.  Inspection  under reduced pneumoperitoneum also confirms this.  Excess fluid is removed.  Suprapubic trocar sleeve is removed.  The pneumoperitoneum is reduced, and  the umbilical trocar sleeve is removed.  A 2-0 Vicryl suture is used  subcutaneously to reapproximate the skin on the umbilical incision.  Both  incisions are injected with 0.5% plain Marcaine.  Dermabond is used to close  the skin.  All counts are correct.  The patient is awakened and taken to the  recovery room in stable condition.      Guy Sandifer Henderson Cloud, M.D.  Electronically Signed     JET/MEDQ  D:  09/12/2004  T:  09/12/2004  Job:  [id number]

## 2010-07-01 NOTE — H&P (Signed)
NAME:  Jamie [name], Jamie [name]               ACCOUNT NO.:  [id number]   MEDICAL RECORD NO.:  [id number]          PATIENT TYPE:  AMB   LOCATION:  SDC                           FACILITY:  [facility]   PHYSICIAN:  Guy Sandifer. Henderson Cloud, M.D. DATE OF BIRTH:  05/05/1965   DATE OF ADMISSION:  09/12/2004  DATE OF DISCHARGE:                                HISTORY & PHYSICAL   CHIEF COMPLAINT:  Heavy menses.   HISTORY OF PRESENT ILLNESS:  This patient is a 45 year old married black  female, gravida 2, para 1, with known endometriosis who continues to have  increasing heavy pain as well as heavy menses which has been symptomatic  with anemia.  She is status post hysteroscopy and D&C for benign polyps and  laparoscopy for treatment of endometriosis in 2005.  Flows have continued to  be heavy and dysmenorrhea has been progressive.  She has had recurrent  anemia.  Evaluation with her primary care physician has revealed a normal  hemoglobin electrophoresis and no other cause for anemia has been noted.   PAST MEDICAL HISTORY:  1.  Chronic hypertension.  2.  Endometriosis.  3.  History of endometrial polyps.  4.  Cervical dysplasia.  5.  Frequent UTI's.  6.  Occluded left fallopian tube.   PAST SURGICAL HISTORY:  1.  LEEP of the cervix.  2.  Laparoscopy, hysteroscopy, and D&C as above.   MEDICATIONS:  1.  Norvasc 5 mg daily.  2.  Iron and vitamins daily.   ALLERGIES:  CIPRO leading to leg cramps.   FAMILY HISTORY:  Chronic hypertension in mother and father.   OBSTETRIC HISTORY:  Termination x1.  Vaginal delivery x1.   REASON FOR ADMISSION:  NEUROLOGY:  Denies headache.  CARDIOVASCULAR:  Denies  chest pain.  PULMONARY:  Denies shortness of breath.  GASTROINTESTINAL:  Denies recent changes in bowel habits.   PHYSICAL EXAMINATION:  VITAL SIGNS:  Height 5 feet 8 inches, weight 156  pounds, blood pressure 118/82.  HEENT:  Without thyromegaly.  LUNGS:  Clear to auscultation.  HEART:  Regular rate and  rhythm.  BACK:  Without CVA tenderness.  BREASTS:  Without mass, retraction, or discharge.  ABDOMEN:  Soft and nontender without masses.  PELVIC:  Vulva, vagina, and cervix without lesions.  Uterus is normal size,  mobile, and nontender.  Adnexa nontender without masses.  EXTREMITIES:  Grossly within normal limits.  NEUROLOGY:  Grossly within normal limits.   ASSESSMENT:  Menorrhagia and dysmenorrhea.   PLAN:  Laparoscopically-assisted vaginal hysterectomy.      Guy Sandifer Henderson Cloud, M.D.  Electronically Signed     JET/MEDQ  D:  09/02/2004  T:  09/02/2004  Job:  [id number]

## 2010-07-01 NOTE — H&P (Signed)
NAME:  Jamie [name], Jamie [name] NO.:  [id number]   MEDICAL RECORD NO.:  [id number]                   PATIENT TYPE:   LOCATION:                                       FACILITY:   PHYSICIAN:  Guy Sandifer. Arleta Creek, M.D.           DATE OF BIRTH:   DATE OF ADMISSION:  DATE OF DISCHARGE:                                HISTORY & PHYSICAL   CHIEF COMPLAINT:  Irregular menses and pelvic pain.   HISTORY OF PRESENT ILLNESS:  This patient is a 45 year old married black  female, G2, P1 who has continuing dysmenorrhea that has been refractory to  management.  She also has increasingly heavy and irregular menses.  Ultrasound in my office on 12/23/02 revealed a uterus measuring 9.9 x 4.3 by  6.3 cm.  Sonohysterogram revealed at least 2 masses, 1 measuring 3 cm, the  other measuring 1.2 cm in the endometrial cavity. The ovaries had a  polycystic appearance.   After a discussion of the options, she is being admitted for laparoscopy,  hysteroscopy, D&C.  Potential risks and complications had been discussed  with the patient preoperatively.   PAST MEDICAL HISTORY:  1. Cervical dysplasia.  2. Frequent urinary tract infections.  3. Occluded left fallopian tube.  4. Chronic hypertension.   PAST SURGICAL HISTORY:  LEEP procedure of cervix.   MEDICATIONS:  Labetalol 100 mg.   ALLERGIES:  CIPRO LEADING TO LEG CRAMPS.   FAMILY HISTORY:  Chronic hypertension mother and father.   OBSTETRICAL HISTORY:  Termination x1, vaginal delivery x1.   REVIEW OF SYSTEMS:  NEUROLOGIC:  Denies headache.  PULMONARY: Denies  shortness of breath and cough.  CARDIOVASCULAR: Denies chest pain.  GASTROINTESTINAL:  Denies recent changes in bowel habits.   PHYSICAL EXAMINATION:  VITAL SIGNS:  Height 5 feet, 8 inches, weight 148  pounds, blood pressure 132/84.  HEENT:  Without thyromegaly.  LUNGS:  Clear to auscultation.  CARDIOVASCULAR:  Regular rate and rhythm.  BACK:  Without CVA tenderness.  BREASTS:  Without masses or active discharge.  ABDOMEN:  Soft, nontender without masses.  PELVIC:  Vulva, vagina, cervix without lesion. Uterus was normal size,  mobile, nontender. Adnexa nontender without masses.  EXTREMITIES/NEUROLOGIC:  Grossly within normal limits.   ASSESSMENT:  Dysmenorrhea and menometrorrhagia.   PLAN:  Laparoscopy, hysteroscopy, D&C.                                               Guy Sandifer Arleta Creek, M.D.    JET/MEDQ  D:  04/22/2003  T:  04/22/2003  Job:  [id number]

## 2010-10-10 ENCOUNTER — Other Ambulatory Visit: Payer: Self-pay | Admitting: Internal Medicine

## 2010-10-22 ENCOUNTER — Other Ambulatory Visit: Payer: Self-pay | Admitting: Internal Medicine

## 2010-10-22 DIAGNOSIS — Z0000 Encounter for general adult medical examination without abnormal findings: Secondary | ICD-10-CM

## 2010-10-26 ENCOUNTER — Other Ambulatory Visit: Payer: 59

## 2010-10-27 ENCOUNTER — Other Ambulatory Visit (INDEPENDENT_AMBULATORY_CARE_PROVIDER_SITE_OTHER): Payer: 59

## 2010-10-27 DIAGNOSIS — Z0000 Encounter for general adult medical examination without abnormal findings: Secondary | ICD-10-CM

## 2010-10-27 LAB — CBC WITH DIFFERENTIAL/PLATELET
Basophils Absolute: 0 10*3/uL (ref 0.0–0.1)
Eosinophils Relative: 0.5 % (ref 0.0–5.0)
HCT: 46.9 % — ABNORMAL HIGH (ref 36.0–46.0)
Hemoglobin: 15.6 g/dL — ABNORMAL HIGH (ref 12.0–15.0)
Lymphocytes Relative: 18.3 % (ref 12.0–46.0)
Monocytes Relative: 5.2 % (ref 3.0–12.0)
Neutro Abs: 6.4 10*3/uL (ref 1.4–7.7)
RDW: 14 % (ref 11.5–14.6)
WBC: 8.5 10*3/uL (ref 4.5–10.5)

## 2010-10-27 LAB — LIPID PANEL: Cholesterol: 152 mg/dL (ref 0–200)

## 2010-10-27 LAB — URINALYSIS, ROUTINE W REFLEX MICROSCOPIC
Leukocytes, UA: NEGATIVE
Nitrite: NEGATIVE
Specific Gravity, Urine: 1.02 (ref 1.000–1.030)
pH: 7 (ref 5.0–8.0)

## 2010-10-27 LAB — COMPREHENSIVE METABOLIC PANEL
ALT: 14 U/L (ref 0–35)
CO2: 29 mEq/L (ref 19–32)
Calcium: 9 mg/dL (ref 8.4–10.5)
Chloride: 103 mEq/L (ref 96–112)
Creatinine, Ser: 0.7 mg/dL (ref 0.4–1.2)
GFR: 121.6 mL/min (ref >60.00)
Glucose, Bld: 84 mg/dL (ref 70–99)
Sodium: 139 mEq/L (ref 135–145)
Total Protein: 7.1 g/dL (ref 6.0–8.3)

## 2010-10-27 LAB — TSH: TSH: 0.4 u[IU]/mL (ref 0.35–5.50)

## 2010-11-02 ENCOUNTER — Ambulatory Visit (INDEPENDENT_AMBULATORY_CARE_PROVIDER_SITE_OTHER): Payer: 59 | Admitting: Internal Medicine

## 2010-11-02 ENCOUNTER — Encounter: Payer: Self-pay | Admitting: Internal Medicine

## 2010-11-02 VITALS — BP 138/90 | HR 68 | Temp 98.2°F | Resp 16 | Wt 162.0 lb

## 2010-11-02 DIAGNOSIS — I10 Essential (primary) hypertension: Secondary | ICD-10-CM

## 2010-11-02 DIAGNOSIS — D751 Secondary polycythemia: Secondary | ICD-10-CM | POA: Insufficient documentation

## 2010-11-02 DIAGNOSIS — Z0000 Encounter for general adult medical examination without abnormal findings: Secondary | ICD-10-CM

## 2010-11-02 DIAGNOSIS — Z23 Encounter for immunization: Secondary | ICD-10-CM

## 2010-11-02 MED ORDER — BUDESONIDE-FORMOTEROL FUMARATE 160-4.5 MCG/ACT IN AERO: 2.0000 | INHALATION_SPRAY | Freq: Two times a day (BID) | RESPIRATORY_TRACT | Status: DC

## 2010-11-02 MED ORDER — LOSARTAN POTASSIUM-HCTZ 100-12.5 MG PO TABS: 1.0000 | ORAL_TABLET | Freq: Every day | ORAL | Status: DC

## 2010-11-02 MED ORDER — VALACYCLOVIR HCL 500 MG PO TABS: 500.0000 mg | ORAL_TABLET | Freq: Two times a day (BID) | ORAL | Status: DC

## 2010-11-02 NOTE — Assessment & Plan Note (Signed)
Continue with current prescription therapy as reflected on the Med list.  

## 2010-11-08 NOTE — Progress Notes (Signed)
  Subjective:    Patient ID: Jamie [name], female    DOB: 11-08-1965, 45 y.o.   MRN: [id number]  HPI  The patient is here for a wellness exam. The patient has been doing well overall without major physical or psychological issues going on lately. The patient needs to address  chronic hypertension that has been well controlled with medicines    Review of Systems  Constitutional: Negative for fever, chills, diaphoresis, activity change, appetite change, fatigue and unexpected weight change.  HENT: Negative for hearing loss, ear pain, congestion, sore throat, sneezing, mouth sores, neck pain, dental problem, voice change, postnasal drip and sinus pressure.   Eyes: Negative for pain and visual disturbance.  Respiratory: Negative for cough, chest tightness, wheezing and stridor.   Cardiovascular: Negative for chest pain, palpitations and leg swelling.  Gastrointestinal: Negative for nausea, vomiting, abdominal pain, blood in stool, abdominal distention and rectal pain.  Genitourinary: Negative for dysuria, hematuria, decreased urine volume, vaginal bleeding, vaginal discharge, difficulty urinating, vaginal pain and menstrual problem.  Musculoskeletal: Negative for back pain, joint swelling and gait problem.  Skin: Negative for color change, rash and wound.  Neurological: Negative for dizziness, tremors, syncope, speech difficulty and light-headedness.  Hematological: Negative for adenopathy.  Psychiatric/Behavioral: Negative for suicidal ideas, hallucinations, behavioral problems, confusion, sleep disturbance, dysphoric mood and decreased concentration. The patient is not hyperactive.        Objective:   Physical Exam  Constitutional: She appears well-developed and well-nourished. No distress.  HENT:  Head: Normocephalic.  Right Ear: External ear normal.  Left Ear: External ear normal.  Nose: Nose normal.  Mouth/Throat: Oropharynx is clear and moist.  Eyes: Conjunctivae are normal.  Pupils are equal, round, and reactive to light. Right eye exhibits no discharge. Left eye exhibits no discharge.  Neck: Normal range of motion. Neck supple. No JVD present. No tracheal deviation present. No thyromegaly present.  Cardiovascular: Normal rate, regular rhythm and normal heart sounds.   Pulmonary/Chest: No stridor. No respiratory distress. She has no wheezes.  Abdominal: Soft. Bowel sounds are normal. She exhibits no distension and no mass. There is no tenderness. There is no rebound and no guarding.  Musculoskeletal: She exhibits no edema and no tenderness.  Lymphadenopathy:    She has no cervical adenopathy.  Neurological: She displays normal reflexes. No cranial nerve deficit. She exhibits normal muscle tone. Coordination normal.  Skin: No rash noted. No erythema.  Psychiatric: She has a normal mood and affect. Her behavior is normal. Judgment and thought content normal.     Lab Results  Component Value Date   WBC 8.5 10/27/2010   HGB 15.6* 10/27/2010   HCT 46.9* 10/27/2010   PLT 212.0 10/27/2010   CHOL 152 10/27/2010   TRIG 35.0 10/27/2010   HDL 50.80 10/27/2010   ALT 14 10/27/2010   AST 18 10/27/2010   NA 139 10/27/2010   K 3.6 10/27/2010   CL 103 10/27/2010   CREATININE 0.7 10/27/2010   BUN 13 10/27/2010   CO2 29 10/27/2010   TSH 0.40 10/27/2010        Assessment & Plan:

## 2011-01-02 ENCOUNTER — Other Ambulatory Visit: Payer: Self-pay | Admitting: Obstetrics and Gynecology

## 2011-01-02 DIAGNOSIS — Z1231 Encounter for screening mammogram for malignant neoplasm of breast: Secondary | ICD-10-CM

## 2011-02-01 ENCOUNTER — Ambulatory Visit
Admission: RE | Admit: 2011-02-01 | Discharge: 2011-02-01 | Disposition: A | Discharge status: Home / Self Care | Payer: 59 | Source: Ambulatory Visit | Attending: Obstetrics and Gynecology | Admitting: Obstetrics and Gynecology

## 2011-02-01 DIAGNOSIS — Z1231 Encounter for screening mammogram for malignant neoplasm of breast: Secondary | ICD-10-CM

## 2011-06-16 ENCOUNTER — Encounter: Payer: Self-pay | Admitting: Internal Medicine

## 2011-06-16 ENCOUNTER — Ambulatory Visit (INDEPENDENT_AMBULATORY_CARE_PROVIDER_SITE_OTHER)
Admission: RE | Admit: 2011-06-16 | Discharge: 2011-06-16 | Disposition: A | Discharge status: Home / Self Care | Payer: 59 | Source: Ambulatory Visit | Attending: Internal Medicine | Admitting: Internal Medicine

## 2011-06-16 ENCOUNTER — Ambulatory Visit (INDEPENDENT_AMBULATORY_CARE_PROVIDER_SITE_OTHER): Payer: 59 | Admitting: Internal Medicine

## 2011-06-16 ENCOUNTER — Other Ambulatory Visit (INDEPENDENT_AMBULATORY_CARE_PROVIDER_SITE_OTHER): Payer: 59

## 2011-06-16 VITALS — BP 150/100 | HR 80 | Temp 97.7°F | Resp 16 | Wt 165.0 lb

## 2011-06-16 DIAGNOSIS — M79609 Pain in unspecified limb: Secondary | ICD-10-CM

## 2011-06-16 DIAGNOSIS — I10 Essential (primary) hypertension: Secondary | ICD-10-CM

## 2011-06-16 DIAGNOSIS — R202 Paresthesia of skin: Secondary | ICD-10-CM

## 2011-06-16 DIAGNOSIS — M79606 Pain in leg, unspecified: Secondary | ICD-10-CM

## 2011-06-16 DIAGNOSIS — R209 Unspecified disturbances of skin sensation: Secondary | ICD-10-CM

## 2011-06-16 DIAGNOSIS — M25552 Pain in left hip: Secondary | ICD-10-CM | POA: Insufficient documentation

## 2011-06-16 DIAGNOSIS — M87059 Idiopathic aseptic necrosis of unspecified femur: Secondary | ICD-10-CM | POA: Insufficient documentation

## 2011-06-16 LAB — BASIC METABOLIC PANEL
BUN: 15 mg/dL (ref 6–23)
CO2: 28 mEq/L (ref 19–32)
GFR: 90.91 mL/min (ref >60.00)
Glucose, Bld: 67 mg/dL — ABNORMAL LOW (ref 70–99)
Potassium: 3.5 mEq/L (ref 3.5–5.1)

## 2011-06-16 LAB — MAGNESIUM: Magnesium: 1.8 mg/dL (ref 1.5–2.5)

## 2011-06-16 LAB — VITAMIN B12: Vitamin B-12: 428 pg/mL (ref 211–911)

## 2011-06-16 MED ORDER — IBUPROFEN 600 MG PO TABS: ORAL_TABLET | ORAL | Status: AC

## 2011-06-16 NOTE — Assessment & Plan Note (Addendum)
R thigh 5/13 ? 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

## 2011-06-16 NOTE — Progress Notes (Signed)
Patient ID: Jamie [name], female   DOB: Sep 30, 1965, 46 y.o.   MRN: [id number]  Subjective:    Patient ID: Jamie [name], female    DOB: September 15, 1965, 46 y.o.   MRN: [id number]  Leg Pain     C/o R thigh pain off and on 6/10 sharp 5 sec and leaves - it woke her up twice. It is more frequent at night   Review of Systems  Constitutional: Negative for fever, chills, diaphoresis, activity change, appetite change, fatigue and unexpected weight change.  HENT: Negative for hearing loss, ear pain, congestion, sore throat, sneezing, mouth sores, neck pain, dental problem, voice change, postnasal drip and sinus pressure.   Eyes: Negative for pain and visual disturbance.  Respiratory: Negative for cough, chest tightness, wheezing and stridor.   Cardiovascular: Negative for chest pain, palpitations and leg swelling.  Gastrointestinal: Negative for nausea, vomiting, abdominal pain, blood in stool, abdominal distention and rectal pain.  Genitourinary: Negative for dysuria, hematuria, decreased urine volume, vaginal bleeding, vaginal discharge, difficulty urinating, vaginal pain and menstrual problem.  Musculoskeletal: Negative for back pain, joint swelling and gait problem.  Skin: Negative for color change, rash and wound.  Neurological: Negative for dizziness, tremors, syncope, speech difficulty and light-headedness.  Hematological: Negative for adenopathy.  Psychiatric/Behavioral: Negative for suicidal ideas, hallucinations, behavioral problems, confusion, sleep disturbance, dysphoric mood and decreased concentration. The patient is not hyperactive.        Objective:   Physical Exam  Constitutional: She appears well-developed and well-nourished. No distress.  HENT:  Head: Normocephalic.  Right Ear: External ear normal.  Left Ear: External ear normal.  Nose: Nose normal.  Mouth/Throat: Oropharynx is clear and moist.  Eyes: Conjunctivae are normal. Pupils are equal, round, and reactive to light.  Right eye exhibits no discharge. Left eye exhibits no discharge.  Neck: Normal range of motion. Neck supple. No JVD present. No tracheal deviation present. No thyromegaly present.  Cardiovascular: Normal rate, regular rhythm and normal heart sounds.   Pulmonary/Chest: No stridor. No respiratory distress. She has no wheezes.  Abdominal: Soft. Bowel sounds are normal. She exhibits no distension and no mass. There is no tenderness. There is no rebound and no guarding.  Musculoskeletal: She exhibits no edema and no tenderness.  Lymphadenopathy:    She has no cervical adenopathy.  Neurological: She displays normal reflexes. No cranial nerve deficit. She exhibits normal muscle tone. Coordination normal.  Skin: No rash noted. No erythema.  Psychiatric: She has a normal mood and affect. Her behavior is normal. Judgment and thought content normal.  Skin on R thigh is hyposensitive in the oval shape area of cut branch distr of the fem nerve   Lab Results  Component Value Date   WBC 8.5 10/27/2010   HGB 15.6* 10/27/2010   HCT 46.9* 10/27/2010   PLT 212.0 10/27/2010   CHOL 152 10/27/2010   TRIG 35.0 10/27/2010   HDL 50.80 10/27/2010   ALT 14 10/27/2010   AST 18 10/27/2010   NA 139 10/27/2010   K 3.6 10/27/2010   CL 103 10/27/2010   CREATININE 0.7 10/27/2010   BUN 13 10/27/2010   CO2 29 10/27/2010   TSH 0.40 10/27/2010        Assessment & Plan:

## 2011-06-18 NOTE — Assessment & Plan Note (Signed)
Continue with current prescription therapy as reflected on the Med list.  

## 2011-11-07 ENCOUNTER — Other Ambulatory Visit: Payer: Self-pay | Admitting: Internal Medicine

## 2012-01-04 ENCOUNTER — Other Ambulatory Visit: Payer: Self-pay | Admitting: Obstetrics and Gynecology

## 2012-01-04 DIAGNOSIS — Z1231 Encounter for screening mammogram for malignant neoplasm of breast: Secondary | ICD-10-CM

## 2012-01-15 ENCOUNTER — Other Ambulatory Visit: Payer: Self-pay | Admitting: *Deleted

## 2012-01-15 MED ORDER — VALACYCLOVIR HCL 500 MG PO TABS: 500.0000 mg | ORAL_TABLET | Freq: Two times a day (BID) | ORAL | Status: DC

## 2012-01-31 ENCOUNTER — Telehealth: Payer: Self-pay | Admitting: *Deleted

## 2012-01-31 ENCOUNTER — Ambulatory Visit (INDEPENDENT_AMBULATORY_CARE_PROVIDER_SITE_OTHER): Payer: 59 | Admitting: Internal Medicine

## 2012-01-31 ENCOUNTER — Encounter: Payer: Self-pay | Admitting: Internal Medicine

## 2012-01-31 ENCOUNTER — Other Ambulatory Visit (INDEPENDENT_AMBULATORY_CARE_PROVIDER_SITE_OTHER): Payer: 59

## 2012-01-31 VITALS — BP 140/100 | HR 88 | Temp 97.5°F | Resp 16 | Wt 162.0 lb

## 2012-01-31 DIAGNOSIS — R21 Rash and other nonspecific skin eruption: Secondary | ICD-10-CM | POA: Insufficient documentation

## 2012-01-31 DIAGNOSIS — M2550 Pain in unspecified joint: Secondary | ICD-10-CM | POA: Insufficient documentation

## 2012-01-31 DIAGNOSIS — I10 Essential (primary) hypertension: Secondary | ICD-10-CM

## 2012-01-31 LAB — SEDIMENTATION RATE: Sed Rate: 12 mm/hr (ref 0–22)

## 2012-01-31 LAB — RHEUMATOID FACTOR: Rhuematoid fact SerPl-aCnc: <10 IU/mL (ref <=14)

## 2012-01-31 MED ORDER — LOSARTAN POTASSIUM-HCTZ 100-12.5 MG PO TABS: 1.0000 | ORAL_TABLET | Freq: Every day | ORAL | Status: DC

## 2012-01-31 MED ORDER — METHYLPREDNISOLONE ACETATE 80 MG/ML IJ SUSP
120.0000 mg | Freq: Once | INTRAMUSCULAR | Status: AC
  Administered 2012-01-31: 120 mg via INTRAMUSCULAR

## 2012-01-31 MED ORDER — PREDNISONE 10 MG PO TABS: ORAL_TABLET | ORAL | Status: DC

## 2012-01-31 NOTE — Assessment & Plan Note (Signed)
D/c norvasc Depo-medrol IM Prednisone 10 mg: take 4 tabs a day x 3 days; then 3 tabs a day x 4 days; then 2 tabs a day x 4 days, then 1 tab a day x 6 days, then stop. Take pc.

## 2012-01-31 NOTE — Telephone Encounter (Signed)
Pt states that she was seen in OV today and was given rx for Losartan-HCTZ. Losartan-HCTZ is the medication that she previously was taking and had reaction from it as discussed in today's OV. Pt has not been taking the Norvasc (Amlodipine). Pt is asking for MD's advisement on BP medications.

## 2012-01-31 NOTE — Progress Notes (Signed)
   Subjective:    Patient ID: Jamie [name], female    DOB: [birth year], 46 y.o.   MRN: [id number]  HPI  C/o rash on eye lids 2  wks ago. C/o rash and pain over finger joints and L wrist pain that started 1 wk ago. Advil did not help; stiff hands. No itching. She has been taking Norvasc only... The patient needs to address  chronic hypertension that has been well controlled with medicines    Review of Systems  Constitutional: Negative for fever, chills, diaphoresis, activity change, appetite change, fatigue and unexpected weight change.  HENT: Negative for hearing loss, ear pain, congestion, sore throat, sneezing, mouth sores, neck pain, dental problem, voice change, postnasal drip and sinus pressure.   Eyes: Negative for pain and visual disturbance.  Respiratory: Negative for cough, chest tightness, wheezing and stridor.   Cardiovascular: Negative for palpitations and leg swelling.  Gastrointestinal: Negative for nausea, vomiting, abdominal pain, blood in stool, abdominal distention and rectal pain.  Genitourinary: Negative for dysuria, hematuria, decreased urine volume, vaginal bleeding, vaginal discharge, difficulty urinating, vaginal pain and menstrual problem.  Musculoskeletal: Positive for arthralgias. Negative for back pain, joint swelling and gait problem.  Skin: Positive for rash. Negative for color change and wound.  Neurological: Negative for dizziness, tremors, syncope, speech difficulty and light-headedness.  Hematological: Negative for adenopathy.  Psychiatric/Behavioral: Negative for suicidal ideas, hallucinations, behavioral problems, confusion, sleep disturbance, dysphoric mood and decreased concentration. The patient is not hyperactive.        Objective:   Physical Exam  Constitutional: She appears well-developed and well-nourished. No distress.  HENT:  Head: Normocephalic.  Right Ear: External ear normal.  Left Ear: External ear normal.  Nose: Nose normal.   Mouth/Throat: Oropharynx is clear and moist.  Eyes: Conjunctivae normal are normal. Pupils are equal, round, and reactive to light. Right eye exhibits no discharge. Left eye exhibits no discharge.  Neck: Normal range of motion. Neck supple. No JVD present. No tracheal deviation present. No thyromegaly present.  Cardiovascular: Normal rate, regular rhythm and normal heart sounds.   Pulmonary/Chest: No stridor. No respiratory distress. She has no wheezes.  Abdominal: Soft. Bowel sounds are normal. She exhibits no distension and no mass. There is no tenderness. There is no rebound and no guarding.  Musculoskeletal: She exhibits tenderness (knuckles). She exhibits no edema.  Lymphadenopathy:    She has no cervical adenopathy.  Neurological: She displays normal reflexes. No cranial nerve deficit. She exhibits normal muscle tone. Coordination normal.  Skin: Rash (eryth patches on palms on the finger joints) noted. No erythema.  Psychiatric: She has a normal mood and affect. Her behavior is normal. Judgment and thought content normal.     Lab Results  Component Value Date   WBC 8.5 10/27/2010   HGB 15.6* 10/27/2010   HCT 46.9* 10/27/2010   PLT 212.0 10/27/2010   CHOL 152 10/27/2010   TRIG 35.0 10/27/2010   HDL 50.80 10/27/2010   ALT 14 10/27/2010   AST 18 10/27/2010   NA 140 06/16/2011   K 3.5 06/16/2011   CL 104 06/16/2011   CREATININE 0.9 06/16/2011   BUN 15 06/16/2011   CO2 28 06/16/2011   TSH 0.70 06/16/2011        Assessment & Plan:

## 2012-01-31 NOTE — Assessment & Plan Note (Signed)
D/c Norvasc - ?rash Start Hyzaar

## 2012-02-01 LAB — ANA: Anti Nuclear Antibody(ANA): NEGATIVE

## 2012-02-01 MED ORDER — TELMISARTAN 80 MG PO TABS: 80.0000 mg | ORAL_TABLET | Freq: Every day | ORAL | Status: DC

## 2012-02-01 NOTE — Telephone Encounter (Signed)
Pt returned Ashley's call.  She was having a reaction to the Amlopidine.  I told her what Dr. Posey Rea said.  She wants the Micardis called into [facility].  If you need to call her after 3pm call at [phone]

## 2012-02-01 NOTE — Telephone Encounter (Signed)
Left message for pt to callback office.  

## 2012-02-01 NOTE — Telephone Encounter (Signed)
Sent [facility]...Raechel Chute

## 2012-02-01 NOTE — Telephone Encounter (Signed)
Yes. Only Losartan-HCTZ.

## 2012-02-01 NOTE — Telephone Encounter (Signed)
D/c Losartan HCT Start Micardis What was the problem with Amlodipine (Norvasc)? Thx

## 2012-02-01 NOTE — Telephone Encounter (Signed)
Sorry  - pls clarify - What has she been taking - Losartan HCT only? Thx

## 2012-02-02 ENCOUNTER — Telehealth: Payer: Self-pay | Admitting: *Deleted

## 2012-02-02 NOTE — Telephone Encounter (Signed)
Left msg on triage requesting to speak with nurse. Wanting labs results...Raechel Chute

## 2012-02-03 ENCOUNTER — Encounter (HOSPITAL_COMMUNITY): Payer: Self-pay | Admitting: *Deleted

## 2012-02-03 ENCOUNTER — Emergency Department (HOSPITAL_COMMUNITY)
Admission: EM | Admit: 2012-02-03 | Discharge: 2012-02-03 | Disposition: A | Discharge status: Home / Self Care | Payer: 59 | Attending: Emergency Medicine | Admitting: Emergency Medicine

## 2012-02-03 DIAGNOSIS — Z8639 Personal history of other endocrine, nutritional and metabolic disease: Secondary | ICD-10-CM | POA: Insufficient documentation

## 2012-02-03 DIAGNOSIS — R21 Rash and other nonspecific skin eruption: Secondary | ICD-10-CM | POA: Insufficient documentation

## 2012-02-03 DIAGNOSIS — Z79899 Other long term (current) drug therapy: Secondary | ICD-10-CM | POA: Insufficient documentation

## 2012-02-03 DIAGNOSIS — M7989 Other specified soft tissue disorders: Secondary | ICD-10-CM | POA: Insufficient documentation

## 2012-02-03 DIAGNOSIS — I10 Essential (primary) hypertension: Secondary | ICD-10-CM | POA: Insufficient documentation

## 2012-02-03 DIAGNOSIS — Z862 Personal history of diseases of the blood and blood-forming organs and certain disorders involving the immune mechanism: Secondary | ICD-10-CM | POA: Insufficient documentation

## 2012-02-03 MED ORDER — PREDNISONE 20 MG PO TABS
60.0000 mg | ORAL_TABLET | Freq: Once | ORAL | Status: AC
  Administered 2012-02-03: 60 mg via ORAL
  Filled 2012-02-03: qty 3

## 2012-02-03 NOTE — ED Notes (Signed)
The pt has been having a reaction to cipro all week with a rash on both her hands.  She saw her doctor on Wednesday and he gave ger an injection of cortisone.  She has pain and swelling in her lt hand wrist and forearm with a rash on her rt hand.  Her lt thumb is more painful with movement.  She is unable to remove the rings on her lt hand

## 2012-02-03 NOTE — ED Provider Notes (Signed)
History     CSN: [id number]  Arrival date & time 02/03/12  4098   First MD Initiated Contact with Patient 02/03/12 [phone]      Chief Complaint  Patient presents with  . hand swelling     (Consider location/radiation/quality/duration/timing/severity/associated sxs/prior treatment) The history is provided by the patient.   46 year old female comes in because of swelling of her hands. She has had a rash as for about the last 2 weeks. It started with some swelling around her eyes which she felt was due to mascara. She went to her eye doctor who did not think that was due to her miscarriage. About 4 days ago, she started getting some redness in her hands and she went to her PCP who gave her an injection of cortisone and a prescription for prednisone. She is back on the prescription filled. Yesterday, she started having swelling in her hands and worsening of the redness. She is also some slight swelling of the left foot. Her hands feel stiff but they're not truly painful. She is having difficulty with activities of daily living such as buttoning and on buttoning clothing. There was some concern that she might have been reacting to one of her blood pressure medications so was changed. She denies fever, chest pain, dyspnea, nausea, vomiting, fever, chills, weight loss. Blood work was drawn for possible rheumatoid arthritis, but she has not found the results.  Past Medical History  Diagnosis Date  . Hypertension   . Multiple thyroid nodules   . History of cold sores     Past Surgical History  Procedure Date  . Abdominal hysterectomy     Family History  Problem Relation Age of Onset  . Hypertension Other   . Hypertension Mother   . Cancer Father 45    prostate ca    History  Substance Use Topics  . Smoking status: Never Smoker   . Smokeless tobacco: Not on file  . Alcohol Use: No    OB History    Grav Para Term Preterm Abortions TAB SAB Ect Mult Living                  Review  of Systems  All other systems reviewed and are negative.    Allergies  Benazepril hcl; Ciprofloxacin; and Hctz  Home Medications   Current Outpatient Rx  Name  Route  Sig  Dispense  Refill  . BUDESONIDE-FORMOTEROL FUMARATE 160-4.5 MCG/ACT IN AERO   Inhalation   Inhale 2 puffs into the lungs 2 (two) times daily.   1 Inhaler   6   . VITAMIN D 1000 UNITS PO TABS   Oral   Take 1,000 Units by mouth daily.           . LUBIPROSTONE 24 MCG PO CAPS   Oral   Take 24 mcg by mouth daily as needed.           . TELMISARTAN 80 MG PO TABS   Oral   Take 1 tablet (80 mg total) by mouth daily.   30 tablet   11   . TRIAMCINOLONE ACETONIDE 0.5 % EX CREA   Topical   Apply 1 application topically 2 (two) times daily.           [name] VALACYCLOVIR HCL 500 MG PO TABS   Oral   Take 1 tablet (500 mg total) by mouth 2 (two) times daily.   14 tablet   3   . PREDNISONE 10 MG PO TABS  Prednisone 10 mg: take 4 tabs a day x 3 days; then 3 tabs a day x 4 days; then 2 tabs a day x 4 days, then 1 tab a day x 6 days, then stop. Take pc.   38 tablet   1     BP 165/108  Pulse 97  Temp 97 F (36.1 C) (Oral)  Resp 18  SpO2 100%  Physical Exam  Nursing note and vitals reviewed.  46 year old female, resting comfortably and in no acute distress. Vital signs are significant for hypertension with blood pressure 165/108. Oxygen saturation is 100%, which is normal. Head is normocephalic and atraumatic. PERRLA, EOMI. Oropharynx is clear. Neck is nontender and supple without adenopathy or JVD. Back is nontender and there is no CVA tenderness. Lungs are clear without rales, wheezes, or rhonchi. Chest is nontender. Heart has regular rate and rhythm without murmur. Abdomen is soft, flat, nontender without masses or hepatosplenomegaly and peristalsis is normoactive. Extremities have mild to moderate swelling of the hands and mild swelling of the left foot. There is generalized erythema on her  hands with slight swelling and worsening of skin over her knuckles. Appearance is nonspecific. There is no joint swelling or effusion and no palpable synovial thickening. There is 1+ pedal edema on the left foot with no erythema seen. Skin is warm and dry without other rash. Neurologic: Mental status is normal, cranial nerves are intact, there are no motor or sensory deficits.  ED Course  Procedures (including critical care time)   1. Hand swelling   2. Rash       MDM  Erythema and swelling which could be part of an allergic reaction but could also be manifestation of a connective tissue disorder. It is interesting that the swelling occurred about 2 days after she got her steroid injection and this would've been about the time that the steroid injection would've worn off. She is encouraged to get her prescription for prednisone filled and take it as directed and she probably should be evaluated by a rheumatologist. Old records were reviewed, and she did have ANA, sed rate, and rheumatoid factor drawn and results have come back all negative.        Dione Booze, MD 02/03/12 [phone]

## 2012-02-05 NOTE — Telephone Encounter (Signed)
Left detailed mess informing pt recent labs are all normal.

## 2012-02-06 ENCOUNTER — Encounter: Payer: Self-pay | Admitting: Internal Medicine

## 2012-02-12 ENCOUNTER — Ambulatory Visit
Admission: RE | Admit: 2012-02-12 | Discharge: 2012-02-12 | Disposition: A | Discharge status: Home / Self Care | Payer: 59 | Source: Ambulatory Visit | Attending: Obstetrics and Gynecology | Admitting: Obstetrics and Gynecology

## 2012-02-12 DIAGNOSIS — Z1231 Encounter for screening mammogram for malignant neoplasm of breast: Secondary | ICD-10-CM

## 2012-02-13 ENCOUNTER — Encounter: Payer: Self-pay | Admitting: Internal Medicine

## 2012-02-13 ENCOUNTER — Ambulatory Visit (INDEPENDENT_AMBULATORY_CARE_PROVIDER_SITE_OTHER)
Admission: RE | Admit: 2012-02-13 | Discharge: 2012-02-13 | Disposition: A | Discharge status: Home / Self Care | Payer: 59 | Source: Ambulatory Visit | Attending: Internal Medicine | Admitting: Internal Medicine

## 2012-02-13 ENCOUNTER — Ambulatory Visit (INDEPENDENT_AMBULATORY_CARE_PROVIDER_SITE_OTHER): Payer: 59 | Admitting: Internal Medicine

## 2012-02-13 VITALS — BP 142/100 | HR 95 | Temp 97.2°F | Ht 66.0 in | Wt 165.2 lb

## 2012-02-13 DIAGNOSIS — M2550 Pain in unspecified joint: Secondary | ICD-10-CM

## 2012-02-13 DIAGNOSIS — I10 Essential (primary) hypertension: Secondary | ICD-10-CM

## 2012-02-13 DIAGNOSIS — M79609 Pain in unspecified limb: Secondary | ICD-10-CM

## 2012-02-13 DIAGNOSIS — M79642 Pain in left hand: Secondary | ICD-10-CM

## 2012-02-13 DIAGNOSIS — M79641 Pain in right hand: Secondary | ICD-10-CM

## 2012-02-13 DIAGNOSIS — R21 Rash and other nonspecific skin eruption: Secondary | ICD-10-CM

## 2012-02-13 MED ORDER — METOPROLOL SUCCINATE ER 50 MG PO TB24: 50.0000 mg | ORAL_TABLET | Freq: Every day | ORAL | Status: DC

## 2012-02-13 MED ORDER — NYSTATIN 100000 UNIT/ML MT SUSP: 500000.0000 [IU] | Freq: Four times a day (QID) | OROMUCOSAL | Status: DC

## 2012-02-13 MED ORDER — BUDESONIDE-FORMOTEROL FUMARATE 160-4.5 MCG/ACT IN AERO: 2.0000 | INHALATION_SPRAY | Freq: Two times a day (BID) | RESPIRATORY_TRACT | Status: DC

## 2012-02-13 MED ORDER — PROMETHAZINE-CODEINE 6.25-10 MG/5ML PO SYRP: 5.0000 mL | ORAL_SOLUTION | ORAL | Status: DC | PRN

## 2012-02-13 NOTE — Assessment & Plan Note (Signed)
Will try Toprol instead

## 2012-02-13 NOTE — Assessment & Plan Note (Signed)
12/13 due to Norvasc(?) or other meds vs other, ie RF negative RA Cont w/Prednisone Hold Micardis and start Toprol X ray hands Rhem cons Dr Kellie Simmering

## 2012-02-13 NOTE — Progress Notes (Signed)
   Subjective:    Patient ID: Jamie [name], female    DOB: February 08, 1966, 46 y.o.   MRN: [id number]  HPI  F/u on rash on eye lids and rash - resolved and pain over finger joints and L wrist pain - not better. Prednisone did not help; stiff hands. No itching. She has stopped taking Norvasc... The patient needs to address  chronic hypertension that has been well controlled with medicines    Review of Systems  Constitutional: Negative for fever, chills, diaphoresis, activity change, appetite change, fatigue and unexpected weight change.  HENT: Negative for hearing loss, ear pain, congestion, sore throat, sneezing, mouth sores, neck pain, dental problem, voice change, postnasal drip and sinus pressure.   Eyes: Negative for pain and visual disturbance.  Respiratory: Negative for cough, chest tightness, wheezing and stridor.   Cardiovascular: Negative for palpitations and leg swelling.  Gastrointestinal: Negative for nausea, vomiting, abdominal pain, blood in stool, abdominal distention and rectal pain.  Genitourinary: Negative for dysuria, hematuria, decreased urine volume, vaginal bleeding, vaginal discharge, difficulty urinating, vaginal pain and menstrual problem.  Musculoskeletal: Positive for arthralgias. Negative for back pain, joint swelling and gait problem.  Skin: Positive for rash. Negative for color change and wound.  Neurological: Negative for dizziness, tremors, syncope, speech difficulty and light-headedness.  Hematological: Negative for adenopathy.  Psychiatric/Behavioral: Negative for suicidal ideas, hallucinations, behavioral problems, confusion, sleep disturbance, dysphoric mood and decreased concentration. The patient is not hyperactive.        Objective:   Physical Exam  Constitutional: She appears well-developed and well-nourished. No distress.  HENT:  Head: Normocephalic.  Right Ear: External ear normal.  Left Ear: External ear normal.  Nose: Nose normal.    Mouth/Throat: Oropharynx is clear and moist.  Eyes: Conjunctivae normal are normal. Pupils are equal, round, and reactive to light. Right eye exhibits no discharge. Left eye exhibits no discharge.  Neck: Normal range of motion. Neck supple. No JVD present. No tracheal deviation present. No thyromegaly present.  Cardiovascular: Normal rate, regular rhythm and normal heart sounds.   Pulmonary/Chest: No stridor. No respiratory distress. She has no wheezes.  Abdominal: Soft. Bowel sounds are normal. She exhibits no distension and no mass. There is no tenderness. There is no rebound and no guarding.  Musculoskeletal: She exhibits tenderness (knuckles). She exhibits no edema.  Lymphadenopathy:    She has no cervical adenopathy.  Neurological: She displays normal reflexes. No cranial nerve deficit. She exhibits normal muscle tone. Coordination normal.  Skin: No rash noted. No erythema.  Psychiatric: She has a normal mood and affect. Her behavior is normal. Judgment and thought content normal.   RF neg  Lab Results  Component Value Date   WBC 8.5 10/27/2010   HGB 15.6* 10/27/2010   HCT 46.9* 10/27/2010   PLT 212.0 10/27/2010   CHOL 152 10/27/2010   TRIG 35.0 10/27/2010   HDL 50.80 10/27/2010   ALT 14 10/27/2010   AST 18 10/27/2010   NA 140 06/16/2011   K 3.5 06/16/2011   CL 104 06/16/2011   CREATININE 0.9 06/16/2011   BUN 15 06/16/2011   CO2 28 06/16/2011   TSH 0.70 06/16/2011        Assessment & Plan:

## 2012-02-13 NOTE — Assessment & Plan Note (Signed)
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  Almost resolved

## 2012-02-13 NOTE — Patient Instructions (Signed)
Hold Micardis - take Metoprolol instead

## 2012-02-14 DIAGNOSIS — J189 Pneumonia, unspecified organism: Secondary | ICD-10-CM

## 2012-02-14 HISTORY — DX: Pneumonia, unspecified organism: J18.9

## 2012-02-15 ENCOUNTER — Telehealth: Payer: Self-pay | Admitting: Internal Medicine

## 2012-03-05 ENCOUNTER — Other Ambulatory Visit: Payer: Self-pay | Admitting: Obstetrics and Gynecology

## 2012-03-05 DIAGNOSIS — R928 Other abnormal and inconclusive findings on diagnostic imaging of breast: Secondary | ICD-10-CM

## 2012-03-06 ENCOUNTER — Ambulatory Visit (INDEPENDENT_AMBULATORY_CARE_PROVIDER_SITE_OTHER)
Admission: RE | Admit: 2012-03-06 | Discharge: 2012-03-06 | Disposition: A | Discharge status: Home / Self Care | Payer: 59 | Source: Ambulatory Visit | Attending: Internal Medicine | Admitting: Internal Medicine

## 2012-03-06 ENCOUNTER — Encounter: Payer: Self-pay | Admitting: Internal Medicine

## 2012-03-06 ENCOUNTER — Ambulatory Visit (INDEPENDENT_AMBULATORY_CARE_PROVIDER_SITE_OTHER): Payer: 59 | Admitting: Internal Medicine

## 2012-03-06 VITALS — BP 140/86 | HR 120 | Temp 98.6°F | Resp 20 | Wt 162.0 lb

## 2012-03-06 DIAGNOSIS — R05 Cough: Secondary | ICD-10-CM

## 2012-03-06 DIAGNOSIS — M2550 Pain in unspecified joint: Secondary | ICD-10-CM

## 2012-03-06 DIAGNOSIS — I10 Essential (primary) hypertension: Secondary | ICD-10-CM

## 2012-03-06 DIAGNOSIS — R918 Other nonspecific abnormal finding of lung field: Secondary | ICD-10-CM

## 2012-03-06 DIAGNOSIS — R21 Rash and other nonspecific skin eruption: Secondary | ICD-10-CM

## 2012-03-06 MED ORDER — PREDNISONE 10 MG PO TABS: ORAL_TABLET | ORAL | Status: DC

## 2012-03-06 MED ORDER — FLUCONAZOLE 100 MG PO TABS: ORAL_TABLET | ORAL | Status: DC

## 2012-03-06 MED ORDER — HYDROCOD POLST-CHLORPHEN POLST 10-8 MG/5ML PO LQCR: 5.0000 mL | Freq: Two times a day (BID) | ORAL | Status: DC | PRN

## 2012-03-07 ENCOUNTER — Telehealth: Payer: Self-pay | Admitting: Internal Medicine

## 2012-03-07 DIAGNOSIS — J189 Pneumonia, unspecified organism: Secondary | ICD-10-CM

## 2012-03-07 MED ORDER — AMOXICILLIN-POT CLAVULANATE 875-125 MG PO TABS: 1.0000 | ORAL_TABLET | Freq: Two times a day (BID) | ORAL | Status: DC

## 2012-03-07 NOTE — Telephone Encounter (Signed)
Jamie [name], please, inform patient that her CXR is showing a pneumonia in B lungs. Start Augmentin x 2 wks. I'll ask for a pulm consult Thx

## 2012-03-07 NOTE — Telephone Encounter (Signed)
Pt informed

## 2012-03-08 ENCOUNTER — Ambulatory Visit (INDEPENDENT_AMBULATORY_CARE_PROVIDER_SITE_OTHER): Payer: 59 | Admitting: Internal Medicine

## 2012-03-08 ENCOUNTER — Encounter: Payer: Self-pay | Admitting: Internal Medicine

## 2012-03-08 VITALS — BP 160/110 | HR 116 | Temp 99.3°F | Ht 67.0 in | Wt 159.8 lb

## 2012-03-08 DIAGNOSIS — R918 Other nonspecific abnormal finding of lung field: Secondary | ICD-10-CM

## 2012-03-08 DIAGNOSIS — I10 Essential (primary) hypertension: Secondary | ICD-10-CM

## 2012-03-08 MED ORDER — NEBIVOLOL HCL 5 MG PO TABS: 5.0000 mg | ORAL_TABLET | Freq: Every day | ORAL | Status: DC

## 2012-03-08 NOTE — Patient Instructions (Addendum)
GERD (REFLUX)  is an extremely common cause of respiratory symptoms, many times with no significant heartburn at all.    It can be treated with medication, but also with lifestyle changes including avoidance of late meals, excessive alcohol, smoking cessation, and avoid fatty foods, chocolate, peppermint, colas, red wine, and acidic juices such as orange juice.  NO MINT OR MENTHOL PRODUCTS SO NO COUGH DROPS  USE SUGARLESS CANDY INSTEAD (jolley ranchers or Stover's)  NO OIL BASED VITAMINS - use powdered substitutes.  Use tussionex for cough  Finish antibiotics  Take prednisone as per Dr Kellie Simmering   Please schedule a follow up office visit in 2 weeks, sooner if needed with cxr     Late add: Called in furosemide 40 mg daily for hbp and ? Vol overload from steroids adding to her symptoms Needs bnp and bmet next ov

## 2012-03-08 NOTE — Progress Notes (Signed)
  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 Pna.   03/08/2012 1st pulmonary eval cc new onset abrupt dry cough and sob 1/14 in setting of polyarthralgia x 6 weeks already on prednisone and was being tapered down because arthritis was getting some better  when resp symptoms flare and no better with symbicort.  Started on augmentin 1/23 with chills 1/19  And temp to 101 nothing since then.  No obvious daytime variabilty or assoc purulent sputum or cp or chest tightness, subjective wheeze overt sinus or hb symptoms. No unusual exp hx or h/o childhood pna/ asthma or premature birth to her knowledge.   Sleeping ok without nocturnal  or early am exacerbation  of respiratory  c/o's or need for noct saba. Also denies any obvious fluctuation of symptoms with weather or environmental changes or other aggravating or alleviating factors except as outlined above    Review of Systems  Constitutional: Negative for fever, chills and unexpected weight change.  HENT: Negative for ear pain, nosebleeds, congestion, sore throat, rhinorrhea, sneezing, trouble swallowing, dental problem, voice change, postnasal drip and sinus pressure.   Eyes: Negative for visual disturbance.  Respiratory: Positive for cough and shortness of breath. Negative for choking.   Cardiovascular: Positive for chest pain. Negative for leg swelling.  Gastrointestinal: Negative for vomiting, abdominal pain and diarrhea.  Genitourinary: Negative for difficulty urinating.  Musculoskeletal: Positive for arthralgias.  Skin: Negative for rash.  Neurological: Negative for tremors, syncope and headaches.  Hematological: Does not bruise/bleed easily.       Objective:   Physical Exam Wt Readings from Last 3 Encounters:  03/08/12 159 lb 12.8 oz  (72.485 kg)  03/06/12 162 lb (73.483 kg)  02/13/12 165 lb 4 oz (74.957 kg)    amb bf with dry cough HEENT: nl dentition, turbinates, and orophanx. Nl external ear canals without cough reflex   NECK :  without JVD/Nodes/TM/ nl carotid upstrokes bilaterally   LUNGS: no acc muscle use, clear to A and P bilaterally without cough on insp or exp maneuvers   CV:  RRR  no s3 or murmur or increase in P2, no edema   ABD:  soft and nontender with nl excursion in the supine position. No bruits or organomegaly, bowel sounds nl  MS:  warm without deformities, calf tenderness, cyanosis or clubbing  SKIN: warm and dry without lesions    NEURO:  alert, approp, no deficits     cxr 03/06/12 Lower lobe predominant bilateral airspace disease, most consistent  with infection.     Assessment & Plan:

## 2012-03-09 DIAGNOSIS — R918 Other nonspecific abnormal finding of lung field: Secondary | ICD-10-CM | POA: Insufficient documentation

## 2012-03-09 MED ORDER — FUROSEMIDE 40 MG PO TABS: 40.0000 mg | ORAL_TABLET | Freq: Every day | ORAL | Status: DC

## 2012-03-09 NOTE — Assessment & Plan Note (Signed)
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 Component of diastolic chf/edema contributing to her symptoms.  The problem with the "most likely dx" is the esr and RA and ANA are all negative so need to widen the ddx to PCP pna, noting the abrupt onset is not typical of this dx but the cxr is.  Since feeling a little better on augmentin will continue this and do close f/u adding furosemide 40 mg daily and bring back in 48 h if not better.

## 2012-03-09 NOTE — Assessment & Plan Note (Signed)
Added furosemide 40 mg daily.  Needs bnp and bmet next ov

## 2012-03-10 ENCOUNTER — Encounter: Payer: Self-pay | Admitting: Internal Medicine

## 2012-03-10 NOTE — Assessment & Plan Note (Signed)
Pulm consult See Meds

## 2012-03-10 NOTE — Assessment & Plan Note (Signed)
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

## 2012-03-10 NOTE — Progress Notes (Signed)
   Subjective:     HPI C/o persistent cough x 1 mo and poss a low grade fever, some SOB F/u on rash on eye lids and rash - resolved and pain over finger joints and L wrist pain - not better. Prednisone did not help; stiff hands. No itching. She has stopped taking Norvasc... The patient needs to address  chronic hypertension that has been well controlled with medicines    Review of Systems  Constitutional: Negative for fever, chills, diaphoresis, activity change, appetite change, fatigue and unexpected weight change.  HENT: Negative for hearing loss, ear pain, congestion, sore throat, sneezing, mouth sores, neck pain, dental problem, voice change, postnasal drip and sinus pressure.   Eyes: Negative for pain and visual disturbance.  Respiratory: Negative for cough, chest tightness, wheezing and stridor.   Cardiovascular: Negative for palpitations and leg swelling.  Gastrointestinal: Negative for nausea, vomiting, abdominal pain, blood in stool, abdominal distention and rectal pain.  Genitourinary: Negative for dysuria, hematuria, decreased urine volume, vaginal bleeding, vaginal discharge, difficulty urinating, vaginal pain and menstrual problem.  Musculoskeletal: Positive for arthralgias. Negative for back pain, joint swelling and gait problem.  Skin: Positive for rash. Negative for color change and wound.  Neurological: Negative for dizziness, tremors, syncope, speech difficulty and light-headedness.  Hematological: Negative for adenopathy.  Psychiatric/Behavioral: Negative for suicidal ideas, hallucinations, behavioral problems, confusion, sleep disturbance, dysphoric mood and decreased concentration. The patient is not hyperactive.        Objective:   Physical Exam  Constitutional: She appears well-developed and well-nourished. No distress.  HENT:  Head: Normocephalic.  Right Ear: External ear normal.  Left Ear: External ear normal.  Nose: Nose normal.  Mouth/Throat: Oropharynx  is clear and moist.  Eyes: Conjunctivae normal are normal. Pupils are equal, round, and reactive to light. Right eye exhibits no discharge. Left eye exhibits no discharge.  Neck: Normal range of motion. Neck supple. No JVD present. No tracheal deviation present. No thyromegaly present.  Cardiovascular: Normal rate, regular rhythm and normal heart sounds.   Pulmonary/Chest: No stridor. No respiratory distress. She has no wheezes.  Abdominal: Soft. Bowel sounds are normal. She exhibits no distension and no mass. There is no tenderness. There is no rebound and no guarding.  Musculoskeletal: She exhibits tenderness (knuckles). She exhibits no edema.  Lymphadenopathy:    She has no cervical adenopathy.  Neurological: She displays normal reflexes. No cranial nerve deficit. She exhibits normal muscle tone. Coordination normal.  Skin: No rash noted. No erythema.  Psychiatric: She has a normal mood and affect. Her behavior is normal. Judgment and thought content normal.   RF neg  Lab Results  Component Value Date   WBC 8.5 10/27/2010   HGB 15.6* 10/27/2010   HCT 46.9* 10/27/2010   PLT 212.0 10/27/2010   CHOL 152 10/27/2010   TRIG 35.0 10/27/2010   HDL 50.80 10/27/2010   ALT 14 10/27/2010   AST 18 10/27/2010   NA 140 06/16/2011   K 3.5 06/16/2011   CL 104 06/16/2011   CREATININE 0.9 06/16/2011   BUN 15 06/16/2011   CO2 28 06/16/2011   TSH 0.70 06/16/2011        Assessment & Plan:

## 2012-03-10 NOTE — Assessment & Plan Note (Signed)
Continue with current prescription therapy as reflected on the Med list.  

## 2012-03-10 NOTE — Assessment & Plan Note (Signed)
Better - poss CTD rash Doubt it is a drug rash

## 2012-03-11 ENCOUNTER — Telehealth: Payer: Self-pay | Admitting: *Deleted

## 2012-03-11 NOTE — Telephone Encounter (Signed)
Fever is gone, she only took tussionex once in last 24 h so it could be she's turning the corner but still concerned about lack of a specific dx (all serologies for connective tissue dz neg including esr) and possibility of opportunistic infection here, esp PCP > advised consider Admit if not improving by tomorrow 1/29 and to er in meantime if worsens

## 2012-03-11 NOTE — Telephone Encounter (Signed)
Please cont w/current abx and Lasix. F/u with Dr [name]. I'll forward him this message too. Thx

## 2012-03-11 NOTE — Telephone Encounter (Signed)
Called the pt and had to LMTCB.  °

## 2012-03-11 NOTE — Telephone Encounter (Signed)
Message copied by Christen Butter on Mon Mar 11, 2012  5:35 PM ------      Message from: Nyoka Cowden      Created: Sat Mar 09, 2012  8:25 AM       Call to see if better and if worse needs to see someone today, if about the same should see me by end of week /  1/31

## 2012-03-11 NOTE — Telephone Encounter (Signed)
Pt calling stating she saw Dr. [name] Friday. He started lasix. She is taking prednisone, abx along with furosemide and has no improvement in symptoms. She states she is concerned as to why she is on lasix and thinks she needs a stronger abx. Please advise.

## 2012-03-13 ENCOUNTER — Ambulatory Visit
Admission: RE | Admit: 2012-03-13 | Discharge: 2012-03-13 | Disposition: A | Discharge status: Home / Self Care | Payer: 59 | Source: Ambulatory Visit | Attending: Obstetrics and Gynecology | Admitting: Obstetrics and Gynecology

## 2012-03-13 DIAGNOSIS — R928 Other abnormal and inconclusive findings on diagnostic imaging of breast: Secondary | ICD-10-CM

## 2012-03-15 NOTE — Telephone Encounter (Signed)
LMTCB x 1 

## 2012-03-18 NOTE — Telephone Encounter (Signed)
x

## 2012-03-22 NOTE — Telephone Encounter (Signed)
LMTCB x 1 

## 2012-03-25 ENCOUNTER — Telehealth: Payer: Self-pay | Admitting: Internal Medicine

## 2012-03-25 NOTE — Telephone Encounter (Signed)
Message from: Nyoka Cowden  Created: Sat Mar 09, 2012 8:25 AM  Call to see if better and if worse needs to see someone today, if about the same should see me by end of week / 1/31 ----  I spoke with pt. She stated she is somewhat better but not 100%. She has an appt coming up and will discuss this then. Nothing further was needed

## 2012-03-25 NOTE — Telephone Encounter (Signed)
LMTCB

## 2012-03-28 ENCOUNTER — Ambulatory Visit: Payer: 59 | Admitting: Internal Medicine

## 2012-04-04 ENCOUNTER — Encounter: Payer: Self-pay | Admitting: Internal Medicine

## 2012-04-04 ENCOUNTER — Ambulatory Visit (INDEPENDENT_AMBULATORY_CARE_PROVIDER_SITE_OTHER)
Admission: RE | Admit: 2012-04-04 | Discharge: 2012-04-04 | Disposition: A | Discharge status: Home / Self Care | Payer: 59 | Source: Ambulatory Visit | Attending: Internal Medicine | Admitting: Internal Medicine

## 2012-04-04 ENCOUNTER — Ambulatory Visit: Payer: 59 | Admitting: Internal Medicine

## 2012-04-04 ENCOUNTER — Ambulatory Visit (INDEPENDENT_AMBULATORY_CARE_PROVIDER_SITE_OTHER): Payer: 59 | Admitting: Internal Medicine

## 2012-04-04 VITALS — BP 160/100 | HR 123 | Temp 97.8°F | Ht 67.0 in | Wt 158.6 lb

## 2012-04-04 DIAGNOSIS — R059 Cough, unspecified: Secondary | ICD-10-CM

## 2012-04-04 DIAGNOSIS — I10 Essential (primary) hypertension: Secondary | ICD-10-CM

## 2012-04-04 DIAGNOSIS — R918 Other nonspecific abnormal finding of lung field: Secondary | ICD-10-CM

## 2012-04-04 DIAGNOSIS — R05 Cough: Secondary | ICD-10-CM

## 2012-04-04 MED ORDER — NEBIVOLOL HCL 5 MG PO TABS: ORAL_TABLET | ORAL | Status: DC

## 2012-04-04 NOTE — Progress Notes (Signed)
  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 Pna.   03/08/2012 1st pulmonary eval cc new onset abrupt dry cough and sob 1/14 in setting of polyarthralgia x 6 weeks already on prednisone and was being tapered down because arthritis was getting some better  when resp symptoms flare and no better with symbicort.  Started on augmentin 1/23 with chills 1/19  And temp to 101 nothing since then.   rec GERD diet Use tussionex for cough Finish antibiotics   04/04/2012 f/u ov/Wert cc much better, no fever, cough some better but still very hoarse and looses breath when talking but otherwise feeling fine, finished pred x one week with no flare of cough, doe or arthritis  No obvious daytime variabilty or assoc purulent sputum or cp or chest tightness, subjective wheeze overt sinus or hb symptoms. No unusual exp hx or h/o childhood pna/ asthma or premature birth to her knowledge.   Sleeping ok without nocturnal  or early am exacerbation  of respiratory  c/o's or need for noct saba. Also denies any obvious fluctuation of symptoms with weather or environmental changes or other aggravating or alleviating factors except as outlined above          Objective:   Physical Exam  04/04/2012  Wt  04/04/12  Wt Readings from Last 3 Encounters:  03/08/12 159 lb 12.8 oz (72.485 kg)  03/06/12 162 lb (73.483 kg)  02/13/12 165 lb 4 oz (74.957 kg)    amb bf with dry cough HEENT: nl dentition, turbinates, and orophanx. Nl external ear canals without cough reflex   NECK :  without JVD/Nodes/TM/ nl carotid upstrokes bilaterally   LUNGS: no acc muscle use, clear to A and P bilaterally without cough on insp or exp maneuvers   CV:  RRR  no s3 or murmur or increase in P2, no edema   ABD:  soft and nontender with nl  excursion in the supine position. No bruits or organomegaly, bowel sounds nl  MS:  warm without deformities, calf tenderness, cyanosis or clubbing  SKIN: warm and dry without lesions    NEURO:  alert, approp, no deficits     CXR 04/04/12 Near complete resolution of the bibasilar opacities. Stable peribronchial thickening and interstitial prominence.       Assessment & Plan:

## 2012-04-04 NOTE — Patient Instructions (Addendum)
Stop micardis Start bystolic 5 mg twice daily  If cough worse:  Delsym cough syrup Try prilosec 20mg   Take 30-60 min before first meal of the day and Pepcid 20 mg one bedtime until cough and hoarsenss is completely gone for at least a week without the need for cough suppression  I think of reflux for chronic cough like I do oxygen for fire (doesn't cause the fire but once you get the oxygen suppressed it usually goes away regardless of the exact cause).   Pulmonary is as needed if cough not improving or breathing worse

## 2012-04-05 NOTE — Assessment & Plan Note (Signed)
Not responding to micardis and tolerates diuretics poorly so rec trial of bystolic 5 mg bid and f/u Dr Posey Rea

## 2012-04-05 NOTE — Assessment & Plan Note (Signed)
Complete resolution p rx with abx and steroids ? All acute lung injury and not indicative of underlying connective tissue dz has hasn't yet flared off steroids with f/u planned by Dr Kellie Simmering.  Pulmonary f/u can be prn

## 2012-04-05 NOTE — Assessment & Plan Note (Addendum)
Classic Upper airway cough syndrome, so named because it's frequently impossible to sort out how much is  CR/sinusitis with freq throat clearing (which can be related to primary GERD)   vs  causing  secondary (" extra esophageal")  GERD from wide swings in gastric pressure that occur with throat clearing, often  promoting self use of mint and menthol lozenges that reduce the lower esophageal sphincter tone and exacerbate the problem further in a cyclical fashion.   These are the same pts (now being labeled as having "irritable larynx syndrome" by some cough centers) who not infrequently have a history of having failed to tolerate ace inhibitors,  dry powder inhalers or biphosphonates or report having atypical reflux symptoms that don't respond to standard doses of PPI , and are easily confused as having aecopd or asthma flares by even experienced allergists/ pulmonologists.   For now just needs to remember to eliminate cyclical cough with delsym and ppi and should not have longterm sequelae but would be happy to see again if flares

## 2012-04-08 ENCOUNTER — Telehealth: Payer: Self-pay | Admitting: Internal Medicine

## 2012-04-08 NOTE — Telephone Encounter (Signed)
Notes Recorded by Nyoka Cowden, MD on 04/05/2012 at 10:15 AM Call pt: Reviewed cxr and no acute change so no change in recommendations made at ov - no further pulmonary f/u needed nor xrays   Called and left detailed message on cell number as requested. Left message to call our office if any concerns or questions.

## 2012-04-08 NOTE — Progress Notes (Signed)
Quick Note:  Pt aware via detailed voicemail per her request noted in phone note dated 04/08/12. ______

## 2012-04-17 ENCOUNTER — Ambulatory Visit (INDEPENDENT_AMBULATORY_CARE_PROVIDER_SITE_OTHER): Payer: 59 | Admitting: Internal Medicine

## 2012-04-17 ENCOUNTER — Encounter: Payer: Self-pay | Admitting: Internal Medicine

## 2012-04-17 VITALS — BP 126/90 | HR 94 | Temp 97.8°F | Resp 16 | Wt 157.0 lb

## 2012-04-17 DIAGNOSIS — L568 Other specified acute skin changes due to ultraviolet radiation: Secondary | ICD-10-CM | POA: Insufficient documentation

## 2012-04-17 DIAGNOSIS — R21 Rash and other nonspecific skin eruption: Secondary | ICD-10-CM

## 2012-04-17 DIAGNOSIS — R918 Other nonspecific abnormal finding of lung field: Secondary | ICD-10-CM

## 2012-04-17 DIAGNOSIS — R05 Cough: Secondary | ICD-10-CM

## 2012-04-17 DIAGNOSIS — M2550 Pain in unspecified joint: Secondary | ICD-10-CM

## 2012-04-17 HISTORY — DX: Other specified acute skin changes due to ultraviolet radiation: L56.8

## 2012-04-17 MED ORDER — TRIAMCINOLONE ACETONIDE 0.5 % EX OINT: TOPICAL_OINTMENT | Freq: Four times a day (QID) | CUTANEOUS | Status: DC | PRN

## 2012-04-17 MED ORDER — NEBIVOLOL HCL 5 MG PO TABS: 5.0000 mg | ORAL_TABLET | Freq: Two times a day (BID) | ORAL | Status: DC

## 2012-04-17 NOTE — Assessment & Plan Note (Signed)
Better  

## 2012-04-17 NOTE — Progress Notes (Signed)
   Subjective:     HPI C/o rash on hands, fingers - ulcers after she developed pneumonia C/o eyes being too sensitive to light. F/u on rash on eye lids  - resolved and pain over finger joints and L wrist pain - not better. Prednisone did not help; stiff hands. No itching. She has stopped taking Norvasc... Ibuprofen did help... The patient needs to address  chronic hypertension that has been well controlled with medicines.    Review of Systems  Constitutional: Negative for fever, chills, diaphoresis, activity change, appetite change, fatigue and unexpected weight change.  HENT: Negative for hearing loss, ear pain, congestion, sore throat, sneezing, mouth sores, neck pain, dental problem, voice change, postnasal drip and sinus pressure.   Eyes: Negative for pain and visual disturbance.  Respiratory: Negative for cough, chest tightness, wheezing and stridor.   Cardiovascular: Negative for palpitations and leg swelling.  Gastrointestinal: Negative for nausea, vomiting, abdominal pain, blood in stool, abdominal distention and rectal pain.  Genitourinary: Negative for dysuria, hematuria, decreased urine volume, vaginal bleeding, vaginal discharge, difficulty urinating, vaginal pain and menstrual problem.  Musculoskeletal: Positive for arthralgias. Negative for back pain, joint swelling and gait problem.  Skin: Positive for rash. Negative for color change and wound.  Neurological: Negative for dizziness, tremors, syncope, speech difficulty and light-headedness.  Hematological: Negative for adenopathy.  Psychiatric/Behavioral: Negative for suicidal ideas, hallucinations, behavioral problems, confusion, sleep disturbance, dysphoric mood and decreased concentration. The patient is not hyperactive.        Objective:   Physical Exam  Constitutional: She appears well-developed and well-nourished. No distress.  HENT:  Head: Normocephalic.  Right Ear: External ear normal.  Left Ear: External ear  normal.  Nose: Nose normal.  Mouth/Throat: Oropharynx is clear and moist.  Eyes: Conjunctivae are normal. Pupils are equal, round, and reactive to light. Right eye exhibits no discharge. Left eye exhibits no discharge.  Neck: Normal range of motion. Neck supple. No JVD present. No tracheal deviation present. No thyromegaly present.  Cardiovascular: Normal rate, regular rhythm and normal heart sounds.   Pulmonary/Chest: No stridor. No respiratory distress. She has no wheezes.  Abdominal: Soft. Bowel sounds are normal. She exhibits no distension and no mass. There is no tenderness. There is no rebound and no guarding.  Musculoskeletal: She exhibits tenderness (knuckles). She exhibits no edema.  Lymphadenopathy:    She has no cervical adenopathy.  Neurological: She displays normal reflexes. No cranial nerve deficit. She exhibits normal muscle tone. Coordination normal.  Skin: No rash noted. No erythema.  Psychiatric: She has a normal mood and affect. Her behavior is normal. Judgment and thought content normal.  small ulcers on fingers  Lab Results  Component Value Date   WBC 8.5 10/27/2010   HGB 15.6* 10/27/2010   HCT 46.9* 10/27/2010   PLT 212.0 10/27/2010   CHOL 152 10/27/2010   TRIG 35.0 10/27/2010   HDL 50.80 10/27/2010   ALT 14 10/27/2010   AST 18 10/27/2010   NA 140 06/16/2011   K 3.5 06/16/2011   CL 104 06/16/2011   CREATININE 0.9 06/16/2011   BUN 15 06/16/2011   CO2 28 06/16/2011   TSH 0.70 06/16/2011        Assessment & Plan:

## 2012-04-17 NOTE — Assessment & Plan Note (Signed)
Ophth cons Dr Charlotte Sanes -- r/o CTD/vasculitis

## 2012-04-17 NOTE — Assessment & Plan Note (Addendum)
--   r/o CTD/vasculitis  Triamc 0.5 % oint qid

## 2012-04-17 NOTE — Assessment & Plan Note (Signed)
Chronic. 

## 2012-04-17 NOTE — Assessment & Plan Note (Signed)
Clinically much better Followed in Pulmonary clinic/ [facility]

## 2012-04-18 NOTE — Assessment & Plan Note (Signed)
Triamc cream qid F/u w/Dr Kellie Simmering ?need for skin bx

## 2012-05-21 ENCOUNTER — Ambulatory Visit (INDEPENDENT_AMBULATORY_CARE_PROVIDER_SITE_OTHER): Payer: 59 | Admitting: Internal Medicine

## 2012-05-21 ENCOUNTER — Encounter: Payer: Self-pay | Admitting: Internal Medicine

## 2012-05-21 VITALS — BP 162/120 | HR 76 | Temp 97.9°F | Resp 16 | Wt 157.0 lb

## 2012-05-21 DIAGNOSIS — I10 Essential (primary) hypertension: Secondary | ICD-10-CM

## 2012-05-21 DIAGNOSIS — R21 Rash and other nonspecific skin eruption: Secondary | ICD-10-CM

## 2012-05-21 DIAGNOSIS — M2550 Pain in unspecified joint: Secondary | ICD-10-CM

## 2012-05-21 MED ORDER — AZILSARTAN MEDOXOMIL 80 MG PO TABS: 1.0000 | ORAL_TABLET | Freq: Every day | ORAL | Status: DC

## 2012-05-21 MED ORDER — NEBIVOLOL HCL 20 MG PO TABS: 20.0000 mg | ORAL_TABLET | Freq: Every day | ORAL | Status: DC

## 2012-05-21 MED ORDER — FLUCONAZOLE 100 MG PO TABS: ORAL_TABLET | ORAL | Status: DC

## 2012-05-21 NOTE — Assessment & Plan Note (Signed)
S/p skin bx

## 2012-05-21 NOTE — Assessment & Plan Note (Signed)
Continue with current prescription therapy as reflected on the Med list.  

## 2012-05-21 NOTE — Progress Notes (Signed)
Subjective:     Hypertension This is a chronic problem. The problem has been gradually worsening since onset. The problem is uncontrolled. Pertinent negatives include no headaches, neck pain or palpitations. There are no associated agents to hypertension. There are no known risk factors for coronary artery disease. There are no compliance problems.   SBP 170-177 C/o rash on hands, fingers - ulcers after she developed pneumonia. S/p skin bx.C/o eyes being too sensitive to light. F/u on rash on eye lids  - resolved and pain over finger joints and L wrist pain - not better. Prednisone did not help; stiff hands. No itching. She has stopped taking Norvasc... Ibuprofen did help... The patient needs to address  chronic hypertension that has been well controlled with medicines.    Review of Systems  Constitutional: Negative for fever, chills, diaphoresis, activity change, appetite change, fatigue and unexpected weight change.  HENT: Positive for voice change. Negative for hearing loss, ear pain, congestion, sore throat, sneezing, mouth sores, neck pain, dental problem, postnasal drip and sinus pressure.   Eyes: Negative for pain and visual disturbance.  Respiratory: Negative for cough, chest tightness, wheezing and stridor.   Cardiovascular: Negative for palpitations and leg swelling.  Gastrointestinal: Negative for nausea, vomiting, abdominal pain, blood in stool, abdominal distention and rectal pain.  Genitourinary: Negative for dysuria, hematuria, decreased urine volume, vaginal bleeding, vaginal discharge, difficulty urinating, vaginal pain and menstrual problem.  Musculoskeletal: Positive for arthralgias. Negative for back pain, joint swelling and gait problem.  Skin: Positive for rash. Negative for color change and wound.  Neurological: Negative for dizziness, tremors, syncope, speech difficulty, light-headedness and headaches.  Hematological: Negative for adenopathy.   Psychiatric/Behavioral: Negative for suicidal ideas, hallucinations, behavioral problems, confusion, sleep disturbance, dysphoric mood and decreased concentration. The patient is not hyperactive.        Objective:   Physical Exam  Constitutional: She appears well-developed and well-nourished. No distress.  HENT:  Head: Normocephalic.  Right Ear: External ear normal.  Left Ear: External ear normal.  Nose: Nose normal.  Mouth/Throat: Oropharynx is clear and moist.  hoarse  Eyes: Conjunctivae are normal. Pupils are equal, round, and reactive to light. Right eye exhibits no discharge. Left eye exhibits no discharge.  Neck: Normal range of motion. Neck supple. No JVD present. No tracheal deviation present. No thyromegaly present.  Cardiovascular: Normal rate, regular rhythm and normal heart sounds.   Pulmonary/Chest: No stridor. No respiratory distress. She has no wheezes.  Abdominal: Soft. Bowel sounds are normal. She exhibits no distension and no mass. There is no tenderness. There is no rebound and no guarding.  Musculoskeletal: She exhibits tenderness (knuckles). She exhibits no edema.  Lymphadenopathy:    She has no cervical adenopathy.  Neurological: She displays normal reflexes. No cranial nerve deficit. She exhibits normal muscle tone. Coordination normal.  Skin: Rash (hands) noted. No erythema.  Psychiatric: She has a normal mood and affect. Her behavior is normal. Judgment and thought content normal.  small ulcers on fingers - dry Trush on inner cheeks  Lab Results  Component Value Date   WBC 8.5 10/27/2010   HGB 15.6* 10/27/2010   HCT 46.9* 10/27/2010   PLT 212.0 10/27/2010   CHOL 152 10/27/2010   TRIG 35.0 10/27/2010   HDL 50.80 10/27/2010   ALT 14 10/27/2010   AST 18 10/27/2010   NA 140 06/16/2011   K 3.5 06/16/2011   CL 104 06/16/2011   CREATININE 0.9 06/16/2011   BUN 15 06/16/2011  CO2 28 06/16/2011   TSH 0.70 06/16/2011        Assessment & Plan:

## 2012-05-21 NOTE — Assessment & Plan Note (Signed)
D/c Micardis; start Edarbi If BP is high in a few days - take Bystolic 20 mg/d

## 2012-05-24 ENCOUNTER — Ambulatory Visit (INDEPENDENT_AMBULATORY_CARE_PROVIDER_SITE_OTHER): Payer: 59 | Admitting: Internal Medicine

## 2012-05-24 ENCOUNTER — Encounter: Payer: Self-pay | Admitting: Internal Medicine

## 2012-05-24 VITALS — BP 140/108 | HR 76 | Temp 98.2°F | Resp 16

## 2012-05-24 DIAGNOSIS — R21 Rash and other nonspecific skin eruption: Secondary | ICD-10-CM

## 2012-05-24 DIAGNOSIS — I10 Essential (primary) hypertension: Secondary | ICD-10-CM

## 2012-05-24 DIAGNOSIS — M2550 Pain in unspecified joint: Secondary | ICD-10-CM

## 2012-05-24 MED ORDER — ASPIRIN 325 MG PO TABS: 325.0000 mg | ORAL_TABLET | Freq: Every day | ORAL | Status: DC

## 2012-05-28 ENCOUNTER — Encounter: Payer: Self-pay | Admitting: Internal Medicine

## 2012-05-28 NOTE — Progress Notes (Signed)
Subjective:     Hypertension This is a chronic problem. The problem has been gradually worsening since onset. The problem is uncontrolled. Pertinent negatives include no headaches, neck pain or palpitations. There are no associated agents to hypertension. There are no known risk factors for coronary artery disease. There are no compliance problems.   Skin bx is c/w vasulitis BP is better C/o rash on hands, fingers - ulcers after she developed pneumonia. S/p skin bx.C/o eyes being too sensitive to light. F/u on rash on eye lids  - resolved and pain over finger joints and L wrist pain - not better. Prednisone did not help; stiff hands. No itching. She has stopped taking Norvasc... Ibuprofen did help...     Review of Systems  Constitutional: Negative for fever, chills, diaphoresis, activity change, appetite change, fatigue and unexpected weight change.  HENT: Positive for voice change. Negative for hearing loss, ear pain, congestion, sore throat, sneezing, mouth sores, neck pain, dental problem, postnasal drip and sinus pressure.   Eyes: Negative for pain and visual disturbance.  Respiratory: Negative for cough, chest tightness, wheezing and stridor.   Cardiovascular: Negative for palpitations and leg swelling.  Gastrointestinal: Negative for nausea, vomiting, abdominal pain, blood in stool, abdominal distention and rectal pain.  Genitourinary: Negative for dysuria, hematuria, decreased urine volume, vaginal bleeding, vaginal discharge, difficulty urinating, vaginal pain and menstrual problem.  Musculoskeletal: Positive for arthralgias. Negative for back pain, joint swelling and gait problem.  Skin: Positive for rash. Negative for color change and wound.  Neurological: Negative for dizziness, tremors, syncope, speech difficulty, light-headedness and headaches.  Hematological: Negative for adenopathy.  Psychiatric/Behavioral: Negative for suicidal ideas, hallucinations, behavioral problems,  confusion, sleep disturbance, dysphoric mood and decreased concentration. The patient is not hyperactive.        Objective:   Physical Exam  Constitutional: She appears well-developed and well-nourished. No distress.  HENT:  Head: Normocephalic.  Right Ear: External ear normal.  Left Ear: External ear normal.  Nose: Nose normal.  Mouth/Throat: Oropharynx is clear and moist.  hoarse  Eyes: Conjunctivae are normal. Pupils are equal, round, and reactive to light. Right eye exhibits no discharge. Left eye exhibits no discharge.  Neck: Normal range of motion. Neck supple. No JVD present. No tracheal deviation present. No thyromegaly present.  Cardiovascular: Normal rate, regular rhythm and normal heart sounds.   Pulmonary/Chest: No stridor. No respiratory distress. She has no wheezes.  Abdominal: Soft. Bowel sounds are normal. She exhibits no distension and no mass. There is no tenderness. There is no rebound and no guarding.  Musculoskeletal: She exhibits tenderness (knuckles). She exhibits no edema.  Lymphadenopathy:    She has no cervical adenopathy.  Neurological: She displays normal reflexes. No cranial nerve deficit. She exhibits normal muscle tone. Coordination normal.  Skin: Rash (hands) noted. No erythema.  Psychiatric: She has a normal mood and affect. Her behavior is normal. Judgment and thought content normal.  small ulcers on fingers - dry No trush on inner cheeks  Lab Results  Component Value Date   WBC 8.5 10/27/2010   HGB 15.6* 10/27/2010   HCT 46.9* 10/27/2010   PLT 212.0 10/27/2010   CHOL 152 10/27/2010   TRIG 35.0 10/27/2010   HDL 50.80 10/27/2010   ALT 14 10/27/2010   AST 18 10/27/2010   NA 140 06/16/2011   K 3.5 06/16/2011   CL 104 06/16/2011   CREATININE 0.9 06/16/2011   BUN 15 06/16/2011   CO2 28 06/16/2011   TSH 0.70  06/16/2011    Skin Bx report    Assessment & Plan:

## 2012-05-28 NOTE — Assessment & Plan Note (Signed)
B hands 3/14 -ulcers  r/o CTD/vasculitis -- s/p bx F/u w/Dr Kellie Simmering is pending

## 2012-05-28 NOTE — Assessment & Plan Note (Signed)
CTD ?etiology F/u w/Dr Kellie Simmering S/p skin bx

## 2012-05-28 NOTE — Assessment & Plan Note (Signed)
Better Continue with current prescription therapy as reflected on the Med list.  

## 2012-07-02 ENCOUNTER — Ambulatory Visit (INDEPENDENT_AMBULATORY_CARE_PROVIDER_SITE_OTHER): Payer: 59 | Admitting: Internal Medicine

## 2012-07-02 ENCOUNTER — Encounter: Payer: Self-pay | Admitting: Internal Medicine

## 2012-07-02 VITALS — BP 168/120 | HR 103 | Temp 97.9°F | Ht 67.0 in | Wt 153.5 lb

## 2012-07-02 DIAGNOSIS — I10 Essential (primary) hypertension: Secondary | ICD-10-CM

## 2012-07-02 DIAGNOSIS — M2550 Pain in unspecified joint: Secondary | ICD-10-CM

## 2012-07-02 DIAGNOSIS — L988 Other specified disorders of the skin and subcutaneous tissue: Secondary | ICD-10-CM

## 2012-07-02 DIAGNOSIS — L959 Vasculitis limited to the skin, unspecified: Secondary | ICD-10-CM | POA: Insufficient documentation

## 2012-07-02 HISTORY — DX: Vasculitis limited to the skin, unspecified: L95.9

## 2012-07-02 MED ORDER — OLMESARTAN-AMLODIPINE-HCTZ 40-10-25 MG PO TABS: 1.0000 | ORAL_TABLET | Freq: Every day | ORAL | Status: DC

## 2012-07-02 NOTE — Progress Notes (Signed)
Subjective:    Patient ID: Jamie [name], female    DOB: 05-02-1965, 47 y.o.   MRN: [id number]  HPI  Here to f/u; overall doing ok,  Pt denies chest pain, increased sob or doe, wheezing, orthopnea, PND, increased LE swelling, palpitations, dizziness or syncope.  Pt denies polydipsia, polyuria, or low sugar symptoms   Pt denies new neurological symptoms such as new headache, or facial or extremity weakness or numbness.   Pt states overall good compliance with meds but may have some confusion about her meds and is Only taking the edarbi (stopped the bystolic as she thought this was what was intended).   Has had several BP elevated recently at opthomology and rheumatology, asked to f/u here. 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 Past Medical History  Diagnosis Date  . Hypertension   . Multiple thyroid nodules   . History of cold sores    Past Surgical History  Procedure Laterality Date  . Abdominal hysterectomy      reports that she has never smoked. She has never used smokeless tobacco. She reports that she does not drink alcohol or use illicit drugs. family history includes Cancer (age of onset: 33) in her father and Hypertension in her mother and other. Allergies  Allergen Reactions  . Benazepril Hcl     REACTION: cough  . Ciprofloxacin     REACTION: Rhabdomyolysis   Current Outpatient Prescriptions on File Prior to Visit  Medication Sig Dispense Refill  . aspirin (BAYER ASPIRIN) 325 MG tablet Take 1 tablet (325 mg total) by mouth daily.  100 tablet  3  . fluconazole (DIFLUCAN) 100 MG tablet Take 2 tabs on day#1, then 1 tab daily on Days #2-10  11 tablet  1  . triamcinolone ointment (KENALOG) 0.5 % Apply topically 4 (four) times daily as needed.  60 g  0  . valACYclovir (VALTREX) 500 MG tablet Take 500 mg by mouth daily as needed.       No current facility-administered medications on file  prior to visit.       Review of Systems  Constitutional: Negative for unexpected weight change, or unusual diaphoresis  HENT: Negative for tinnitus.   Eyes: Negative for photophobia and visual disturbance.  Respiratory: Negative for choking and stridor.   Gastrointestinal: Negative for vomiting and blood in stool.  Genitourinary: Negative for hematuria and decreased urine volume.  Musculoskeletal: Negative for acute joint swelling Skin: Negative for color change and wound.  Neurological: Negative for tremors and numbness other than noted  Psychiatric/Behavioral: Negative for decreased concentration or  hyperactivity.       Objective:   Physical Exam /BP 168/120  Pulse 103  Temp(Src) 97.9 F (36.6 C) (Oral)  Ht 5\' 7"  (1.702 m)  Wt 153 lb 8 oz (69.627 kg)  BMI 24.04 kg/m2  SpO2 97% VS noted,  Constitutional: Pt appears well-developed and well-nourished.  HENT: Head: NCAT.  Right Ear: External ear normal.  Left Ear: External ear normal.  Eyes: Conjunctivae and EOM are normal. Pupils are equal, round, and reactive to light.  Neck: Normal range of motion. Neck supple.  Cardiovascular: Normal rate and regular rhythm.   Pulmonary/Chest: Effort normal and breath sounds normal.  Abd:  Soft, NT, non-distended, + BS Neurological: Pt is alert. Not confused  Skin: Skin is warm. No erythema.  Psychiatric: Pt behavior is normal. Thought content normal.  Assessment & Plan:

## 2012-07-02 NOTE — Assessment & Plan Note (Signed)
Thrombotic, consider eval for cryoglobulins/hep c

## 2012-07-02 NOTE — Patient Instructions (Signed)
Ok to stop the [employer] have already stopped the Bystolic Please take all new medication as prescribed - the Tribenzor Please continue all other medications as before Please keep your appointments with your specialists as you have planned Please return in 1 months, or sooner if needed, to Dr [employer]

## 2012-07-02 NOTE — Assessment & Plan Note (Signed)
?   RA - pt has declines MTX per rheum, no synovitis today,  to f/u any worsening symptoms or concerns

## 2012-07-02 NOTE — Assessment & Plan Note (Signed)
Severe uncontrolled but fortunately asympt, or d/c edarbi, gave 2 wks sample and rx for tribenzor 40/10/25 mg, with f/u with PCP for 4 wks, sooner if needed

## 2012-07-09 ENCOUNTER — Telehealth: Payer: Self-pay | Admitting: *Deleted

## 2012-07-09 MED ORDER — PREDNISONE 10 MG PO TABS: ORAL_TABLET | ORAL | Status: DC

## 2012-07-09 NOTE — Telephone Encounter (Signed)
OK to ref. F/u w/Dr Nani Gasser

## 2012-07-09 NOTE — Telephone Encounter (Signed)
Done

## 2012-07-09 NOTE — Telephone Encounter (Signed)
Rf req for Prednisone 10 mg dose pack. # 38. Ok to Rf?

## 2012-08-05 ENCOUNTER — Telehealth: Payer: Self-pay | Admitting: Internal Medicine

## 2012-08-05 DIAGNOSIS — M79609 Pain in unspecified limb: Secondary | ICD-10-CM

## 2012-08-05 NOTE — Telephone Encounter (Signed)
Patient would like referral to another rheumatologist.  Would like to see [name].

## 2012-08-07 NOTE — Telephone Encounter (Signed)
Ok w/me Thx 

## 2012-08-12 NOTE — Telephone Encounter (Signed)
Referral entered  

## 2012-08-19 ENCOUNTER — Encounter: Payer: Self-pay | Admitting: Internal Medicine

## 2012-08-19 ENCOUNTER — Ambulatory Visit (INDEPENDENT_AMBULATORY_CARE_PROVIDER_SITE_OTHER): Payer: 59 | Admitting: Internal Medicine

## 2012-08-19 VITALS — BP 108/80 | HR 80 | Temp 98.3°F | Resp 16 | Wt 150.0 lb

## 2012-08-19 DIAGNOSIS — M2550 Pain in unspecified joint: Secondary | ICD-10-CM

## 2012-08-19 DIAGNOSIS — I10 Essential (primary) hypertension: Secondary | ICD-10-CM

## 2012-08-19 DIAGNOSIS — L988 Other specified disorders of the skin and subcutaneous tissue: Secondary | ICD-10-CM

## 2012-08-19 DIAGNOSIS — L959 Vasculitis limited to the skin, unspecified: Secondary | ICD-10-CM

## 2012-08-19 MED ORDER — BUTALBITAL-ACETAMINOPHEN 50-300 MG PO TABS: 1.0000 | ORAL_TABLET | Freq: Two times a day (BID) | ORAL | Status: DC | PRN

## 2012-08-19 MED ORDER — PREDNISONE 10 MG PO TABS: 10.0000 mg | ORAL_TABLET | Freq: Every day | ORAL | Status: DC

## 2012-08-19 MED ORDER — FLUCONAZOLE 100 MG PO TABS: ORAL_TABLET | ORAL | Status: DC

## 2012-08-19 MED ORDER — PREGABALIN 75 MG PO CAPS: 75.0000 mg | ORAL_CAPSULE | Freq: Two times a day (BID) | ORAL | Status: DC | PRN

## 2012-08-19 NOTE — Patient Instructions (Addendum)
Try Gluten free diet x 4-6 weeks

## 2012-08-25 ENCOUNTER — Encounter: Payer: Self-pay | Admitting: Internal Medicine

## 2012-08-25 NOTE — Assessment & Plan Note (Signed)
Continue with current prescription therapy as reflected on the Med list.  

## 2012-08-25 NOTE — Assessment & Plan Note (Signed)
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 She would like to start seeing a different rheumatologist.

## 2012-08-25 NOTE — Progress Notes (Signed)
Subjective:   HPI    Here to f/u; overall doing ok,  Pt denies chest pain, increased sob or doe, wheezing, orthopnea, PND, increased LE swelling, palpitations, dizziness or syncope.  Pt denies polydipsia, polyuria, or low sugar symptoms   Pt denies new neurological symptoms such as new headache, or facial or extremity weakness or numbness.   Pt states overall good compliance with meds but may have some confusion about her meds and is taking the edarbi (stopped the bystolic as she thought this was what was intended).   Has had several BP elevated recently at opthomology and rheumatology, asked to f/u here. In retrospect, HCTZ likely did not cause the rash. 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 She would like to start seeing a different rheumatologist.   Past Medical History  Diagnosis Date  . Hypertension   . Multiple thyroid nodules   . History of cold sores    Past Surgical History  Procedure Laterality Date  . Abdominal hysterectomy      reports that she has never smoked. She has never used smokeless tobacco. She reports that she does not drink alcohol or use illicit drugs. family history includes Cancer (age of onset: 15) in her father and Hypertension in her mother and other. Allergies  Allergen Reactions  . Benazepril Hcl     REACTION: cough  . Ciprofloxacin     REACTION: Rhabdomyolysis  . Tramadol     n/v   Current Outpatient Prescriptions on File Prior to Visit  Medication Sig Dispense Refill  . aspirin (BAYER ASPIRIN) 325 MG tablet Take 1 tablet (325 mg total) by mouth daily.  100 tablet  3  . Olmesartan-Amlodipine-HCTZ 40-10-25 MG TABS Take 1 tablet by mouth daily.  90 tablet  3  . triamcinolone ointment (KENALOG) 0.5 % Apply topically 4 (four) times daily as needed.  60 g  0  . valACYclovir (VALTREX) 500 MG tablet Take 500 mg by mouth daily as needed.       No current facility-administered medications on file prior to visit.        Review of Systems  Constitutional: Positive for fatigue. Negative for fever, chills, activity change, appetite change and unexpected weight change.  HENT: Negative for congestion, mouth sores and sinus pressure.   Eyes: Negative for visual disturbance.  Respiratory: Negative for cough and chest tightness.   Gastrointestinal: Negative for nausea and abdominal pain.  Genitourinary: Negative for frequency, difficulty urinating and vaginal pain.  Musculoskeletal: Positive for back pain and arthralgias. Negative for gait problem.  Skin: Positive for rash. Negative for color change, pallor and wound.  Neurological: Negative for dizziness, tremors, weakness, numbness and headaches.  Psychiatric/Behavioral: Negative for suicidal ideas, confusion and sleep disturbance. The patient is not nervous/anxious.        Objective:   Physical Exam  Constitutional: She appears well-developed. No distress.  HENT:  Head: Normocephalic.  Right Ear: External ear normal.  Left Ear: External ear normal.  Nose: Nose normal.  Mouth/Throat: Oropharynx is clear and moist.  Eyes: Conjunctivae are normal. Pupils are equal, round, and reactive to light. Right eye exhibits no discharge. Left eye exhibits no discharge.  Neck: Normal range of motion. Neck supple. No JVD present. No tracheal deviation present. No thyromegaly present.  Cardiovascular: Normal rate, regular rhythm and normal heart sounds.   Pulmonary/Chest: No stridor. No respiratory distress. She has no wheezes.  Abdominal: Soft. Bowel sounds are normal. She exhibits  no distension and no mass. There is no tenderness. There is no rebound and no guarding.  Musculoskeletal: She exhibits no edema and no tenderness.  Lymphadenopathy:    She has no cervical adenopathy.  Neurological: She displays normal reflexes. No cranial nerve deficit. She exhibits normal muscle tone. Coordination normal.  Skin: Rash noted. No erythema.  Small necrotic ulcers on  palms and fingers  Psychiatric: She has a normal mood and affect. Her behavior is normal. Judgment and thought content normal.   /BP 108/80  Pulse 80  Temp(Src) 98.3 F (36.8 C) (Oral)  Resp 16  Wt 150 lb (68.04 kg)  BMI 23.49 kg/m2     Assessment & Plan:

## 2012-08-25 NOTE — Assessment & Plan Note (Signed)
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 She would like to start seeing a different rheumatologist. 

## 2012-09-10 ENCOUNTER — Telehealth: Payer: Self-pay | Admitting: Internal Medicine

## 2012-09-10 MED ORDER — HYDROCODONE-ACETAMINOPHEN 5-325 MG PO TABS: 1.0000 | ORAL_TABLET | Freq: Three times a day (TID) | ORAL | Status: DC | PRN

## 2012-09-10 NOTE — Telephone Encounter (Signed)
Needs Lortab Rx - ok

## 2012-09-30 ENCOUNTER — Other Ambulatory Visit: Payer: Self-pay | Admitting: Obstetrics and Gynecology

## 2012-09-30 DIAGNOSIS — R921 Mammographic calcification found on diagnostic imaging of breast: Secondary | ICD-10-CM

## 2012-10-11 ENCOUNTER — Encounter (HOSPITAL_COMMUNITY): Payer: Self-pay

## 2012-10-11 ENCOUNTER — Emergency Department (HOSPITAL_COMMUNITY): Payer: 59

## 2012-10-11 ENCOUNTER — Inpatient Hospital Stay (HOSPITAL_COMMUNITY)
Admission: EM | Admit: 2012-10-11 | Discharge: 2012-10-21 | DRG: 167 | Disposition: A | Discharge status: Home / Self Care | Payer: 59 | Attending: Internal Medicine | Admitting: Internal Medicine

## 2012-10-11 DIAGNOSIS — M2550 Pain in unspecified joint: Secondary | ICD-10-CM | POA: Diagnosis present

## 2012-10-11 DIAGNOSIS — E042 Nontoxic multinodular goiter: Secondary | ICD-10-CM | POA: Diagnosis present

## 2012-10-11 DIAGNOSIS — R05 Cough: Secondary | ICD-10-CM

## 2012-10-11 DIAGNOSIS — L988 Other specified disorders of the skin and subcutaneous tissue: Secondary | ICD-10-CM

## 2012-10-11 DIAGNOSIS — I10 Essential (primary) hypertension: Secondary | ICD-10-CM | POA: Diagnosis present

## 2012-10-11 DIAGNOSIS — E876 Hypokalemia: Secondary | ICD-10-CM | POA: Diagnosis present

## 2012-10-11 DIAGNOSIS — M0520 Rheumatoid vasculitis with rheumatoid arthritis of unspecified site: Secondary | ICD-10-CM | POA: Diagnosis present

## 2012-10-11 DIAGNOSIS — Z86711 Personal history of pulmonary embolism: Secondary | ICD-10-CM

## 2012-10-11 DIAGNOSIS — M069 Rheumatoid arthritis, unspecified: Secondary | ICD-10-CM | POA: Diagnosis present

## 2012-10-11 DIAGNOSIS — J984 Other disorders of lung: Secondary | ICD-10-CM

## 2012-10-11 DIAGNOSIS — R9389 Abnormal findings on diagnostic imaging of other specified body structures: Secondary | ICD-10-CM

## 2012-10-11 DIAGNOSIS — R490 Dysphonia: Secondary | ICD-10-CM | POA: Diagnosis present

## 2012-10-11 DIAGNOSIS — I498 Other specified cardiac arrhythmias: Secondary | ICD-10-CM | POA: Diagnosis present

## 2012-10-11 DIAGNOSIS — I00 Rheumatic fever without heart involvement: Secondary | ICD-10-CM | POA: Diagnosis present

## 2012-10-11 DIAGNOSIS — R000 Tachycardia, unspecified: Secondary | ICD-10-CM

## 2012-10-11 DIAGNOSIS — R079 Chest pain, unspecified: Secondary | ICD-10-CM

## 2012-10-11 DIAGNOSIS — M79606 Pain in leg, unspecified: Secondary | ICD-10-CM

## 2012-10-11 DIAGNOSIS — E041 Nontoxic single thyroid nodule: Secondary | ICD-10-CM | POA: Diagnosis present

## 2012-10-11 DIAGNOSIS — R918 Other nonspecific abnormal finding of lung field: Principal | ICD-10-CM | POA: Diagnosis present

## 2012-10-11 DIAGNOSIS — J90 Pleural effusion, not elsewhere classified: Secondary | ICD-10-CM | POA: Diagnosis present

## 2012-10-11 DIAGNOSIS — Z21 Asymptomatic human immunodeficiency virus [HIV] infection status: Secondary | ICD-10-CM | POA: Diagnosis present

## 2012-10-11 DIAGNOSIS — I776 Arteritis, unspecified: Secondary | ICD-10-CM

## 2012-10-11 DIAGNOSIS — Z8701 Personal history of pneumonia (recurrent): Secondary | ICD-10-CM

## 2012-10-11 DIAGNOSIS — L959 Vasculitis limited to the skin, unspecified: Secondary | ICD-10-CM

## 2012-10-11 DIAGNOSIS — R21 Rash and other nonspecific skin eruption: Secondary | ICD-10-CM | POA: Diagnosis present

## 2012-10-11 HISTORY — DX: Other specified acute skin changes due to ultraviolet radiation: L56.8

## 2012-10-11 HISTORY — DX: Rheumatoid vasculitis with rheumatoid arthritis of unspecified site: M05.20

## 2012-10-11 HISTORY — DX: Vasculitis limited to the skin, unspecified: L95.9

## 2012-10-11 HISTORY — DX: Rheumatoid arthritis, unspecified: M06.9

## 2012-10-11 HISTORY — DX: Other nonspecific abnormal finding of lung field: R91.8

## 2012-10-11 LAB — POCT I-STAT, CHEM 8
Calcium, Ion: 1.08 mmol/L — ABNORMAL LOW (ref 1.12–1.23)
Glucose, Bld: 82 mg/dL (ref 70–99)
HCT: 38 % (ref 36.0–46.0)
TCO2: 26 mmol/L (ref 0–100)

## 2012-10-11 LAB — URINALYSIS, ROUTINE W REFLEX MICROSCOPIC
Hgb urine dipstick: NEGATIVE
Nitrite: NEGATIVE
Specific Gravity, Urine: 1.01 (ref 1.005–1.030)
Urobilinogen, UA: 1 mg/dL (ref 0.0–1.0)

## 2012-10-11 LAB — CBC WITH DIFFERENTIAL/PLATELET
Basophils Absolute: 0 10*3/uL (ref 0.0–0.1)
Eosinophils Absolute: 0 10*3/uL (ref 0.0–0.7)
Eosinophils Relative: 0 % (ref 0–5)
MCH: 32.7 pg (ref 26.0–34.0)
MCHC: 35.2 g/dL (ref 30.0–36.0)
MCV: 92.9 fL (ref 78.0–100.0)
Platelets: 175 10*3/uL (ref 150–400)
RDW: 13.9 % (ref 11.5–15.5)

## 2012-10-11 LAB — CG4 I-STAT (LACTIC ACID): Lactic Acid, Venous: 0.74 mmol/L (ref 0.5–2.2)

## 2012-10-11 LAB — SEDIMENTATION RATE: Sed Rate: 50 mm/hr — ABNORMAL HIGH (ref 0–22)

## 2012-10-11 LAB — POCT I-STAT TROPONIN I: Troponin i, poc: 0.01 ng/mL (ref 0.00–0.08)

## 2012-10-11 MED ORDER — HYDROCHLOROTHIAZIDE 25 MG PO TABS
25.0000 mg | ORAL_TABLET | Freq: Every day | ORAL | Status: DC
  Administered 2012-10-11 – 2012-10-20 (×9): 25 mg via ORAL
  Filled 2012-10-11 (×11): qty 1

## 2012-10-11 MED ORDER — IOHEXOL 350 MG/ML SOLN
100.0000 mL | Freq: Once | INTRAVENOUS | Status: AC | PRN
  Administered 2012-10-11: 100 mL via INTRAVENOUS

## 2012-10-11 MED ORDER — MORPHINE SULFATE 4 MG/ML IJ SOLN
4.0000 mg | Freq: Once | INTRAMUSCULAR | Status: AC
  Administered 2012-10-11: 4 mg via INTRAVENOUS
  Filled 2012-10-11: qty 1

## 2012-10-11 MED ORDER — OLMESARTAN-AMLODIPINE-HCTZ 40-10-25 MG PO TABS: 1.0000 | ORAL_TABLET | Freq: Every day | ORAL | Status: DC

## 2012-10-11 MED ORDER — IBUPROFEN 200 MG PO TABS
600.0000 mg | ORAL_TABLET | Freq: Once | ORAL | Status: AC
  Administered 2012-10-11: 600 mg via ORAL
  Filled 2012-10-11: qty 3

## 2012-10-11 MED ORDER — SODIUM CHLORIDE 0.9 % IV SOLN
INTRAVENOUS | Status: AC
  Administered 2012-10-11: 16:00:00 via INTRAVENOUS

## 2012-10-11 MED ORDER — IRBESARTAN 300 MG PO TABS
300.0000 mg | ORAL_TABLET | Freq: Every day | ORAL | Status: DC
  Administered 2012-10-11 – 2012-10-21 (×10): 300 mg via ORAL
  Filled 2012-10-11 (×11): qty 1

## 2012-10-11 MED ORDER — POTASSIUM CHLORIDE CRYS ER 20 MEQ PO TBCR
40.0000 meq | EXTENDED_RELEASE_TABLET | Freq: Once | ORAL | Status: AC
  Administered 2012-10-11: 40 meq via ORAL
  Filled 2012-10-11: qty 2

## 2012-10-11 MED ORDER — ENOXAPARIN SODIUM 40 MG/0.4ML ~~LOC~~ SOLN
40.0000 mg | SUBCUTANEOUS | Status: DC
  Administered 2012-10-11 – 2012-10-13 (×3): 40 mg via SUBCUTANEOUS
  Filled 2012-10-11 (×8): qty 0.4

## 2012-10-11 MED ORDER — AMLODIPINE BESYLATE 10 MG PO TABS
10.0000 mg | ORAL_TABLET | Freq: Every day | ORAL | Status: DC
  Administered 2012-10-12 – 2012-10-20 (×8): 10 mg via ORAL
  Filled 2012-10-11 (×11): qty 1

## 2012-10-11 MED ORDER — ONDANSETRON HCL 4 MG/2ML IJ SOLN
4.0000 mg | Freq: Once | INTRAMUSCULAR | Status: AC
Start: 2012-10-11 — End: 2012-10-11
  Administered 2012-10-11: 4 mg via INTRAVENOUS
  Filled 2012-10-11: qty 2

## 2012-10-11 MED ORDER — METHYLPREDNISOLONE SODIUM SUCC 40 MG IJ SOLR
40.0000 mg | Freq: Two times a day (BID) | INTRAMUSCULAR | Status: DC
  Filled 2012-10-11 (×2): qty 1

## 2012-10-11 MED ORDER — METHYLPREDNISOLONE SODIUM SUCC 40 MG IJ SOLR
40.0000 mg | Freq: Two times a day (BID) | INTRAMUSCULAR | Status: DC
  Administered 2012-10-11 – 2012-10-13 (×4): 40 mg via INTRAVENOUS
  Filled 2012-10-11 (×6): qty 1

## 2012-10-11 MED ORDER — SODIUM CHLORIDE 0.9 % IV BOLUS (SEPSIS)
1000.0000 mL | Freq: Once | INTRAVENOUS | Status: AC
  Administered 2012-10-11: 1000 mL via INTRAVENOUS

## 2012-10-11 MED ORDER — ONDANSETRON HCL 4 MG/2ML IJ SOLN
4.0000 mg | Freq: Once | INTRAMUSCULAR | Status: AC
  Administered 2012-10-11: 4 mg via INTRAVENOUS
  Filled 2012-10-11: qty 2

## 2012-10-11 MED ORDER — VALACYCLOVIR HCL 500 MG PO TABS
500.0000 mg | ORAL_TABLET | Freq: Every day | ORAL | Status: DC | PRN
  Filled 2012-10-11: qty 1

## 2012-10-11 NOTE — Progress Notes (Signed)
Patient has arrived to unit, report received and patient is stable; will continue to monitor patient. Lorretta Harp RN

## 2012-10-11 NOTE — H&P (Signed)
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 On chronic prednisone 10 mg daily presents complaining of rib pains and right arm pain. Patient presents with a dry cough for the past 2-3 months. For the past 4 days she has had pain to the right ribs worsening when lying down with occasional sharp shooting pains down her right arm. Pain is not improved after taking Advil. No associated injury or trauma. No complaints of fever, chills, productive cough, hemoptysis, dyspnea or exertion, and abdominal pain, back pain, nausea, vomiting, diarrhea. Patient has rash to both hands and also complaining of pain in the elbow and shoulder joint.  No prior history of PE DVT. No prior history of cardiac disease. No history of diabetes. Patient also states Dr. Margo [name] (dermatology), verified vasculitis by skin biopsy in March 2014 (records not available). Seen by Jamie [name] in Feb 2014 for pulm infiltrates that cleared w/steroids and abx. Complete workup not available in EPIC    Review of Systems: The patient denies anorexia, fever, weight loss,,  decreased hearing, hoarseness, chest pain, syncope, dyspnea on exertion, peripheral edema, balance deficits, hemoptysis, abdominal pain, melena, hematochezia, severe indigestion/heartburn, hematuria, incontinence, genital sores, muscle weakness, suspicious skin lesions, transient blindness, difficulty walking, depression, unusual weight change, abnormal bleeding, enlarged lymph nodes, angioedema, and breast masses.    Procedure CT angiogram 10/11/2012 IMPRESSION:  1. Peripheral irregularity in nodules primarily along the subpleural  regions, with  some associated interstitial accentuation and with  some pericolonic bronchovascular nodules. These nodules are  irregular, and one of the nodules appears to be cavitary.  Differential diagnostic considerations include sarcoidosis, septic  emboli, cryptogenic organizing pneumonia, Churg-Strauss syndrome,  chronic eosinophilic pneumonia, tuberculosis, and Wegener's  granulomatosis.  2. Moderate right pleural effusion. Small pericardial effusion.  3. Scattered the somewhat irregular hypodense lesions in the liver  may be septated and early partially characterized on today's exam.  4. No pulmonary embolus observed.  5. Small sclerotic lesion with central lucency in the right 2nd rib  laterally, likely incidental.     Past Medical History  Diagnosis Date  . Hypertension   . Multiple thyroid nodules   . History of cold sores    Past Surgical History  Procedure Laterality Date  . Abdominal hysterectomy     Social History:  reports that she has never smoked. She has never used smokeless tobacco. She reports that she does not drink alcohol or use illicit drugs.   Allergies  Allergen Reactions  . Benazepril Hcl     REACTION: cough  . Ciprofloxacin     REACTION: Rhabdomyolysis  . Tramadol     n/v    Family History  Problem Relation Age of Onset  . Hypertension Other   . Hypertension Mother   . Cancer Father 57    prostate ca      Prior to Admission medications   Medication Sig Start Date End Date Taking? Authorizing Provider  Olmesartan-Amlodipine-HCTZ 40-10-25 MG TABS Take 1 tablet by mouth daily. 07/02/12  Yes Corwin Levins, MD  predniSONE (DELTASONE) 10 MG tablet Take 1 tablet (10 mg total) by mouth daily. 08/19/12  Yes Tresa Garter, MD  valACYclovir (  VALTREX) 500 MG tablet Take 500 mg by mouth daily as needed. 01/15/12  Yes Tresa Garter, MD   Physical Exam: Filed Vitals:   10/11/12 1130 10/11/12 1205 10/11/12 1230 10/11/12 1330  BP: 123/84  144/87 132/91   Pulse: 101 125 120 122  Temp:      TempSrc:      Resp: 16 19 23 25   Height:      Weight:      SpO2: 99% 96% 98% 99%     General: Alert,NAD  Eyes: Pupils equal round reactive to light and accommodation  Neck: Negative JVD, negative lymph node palpable  Cardiovascular: Regular rhythm and rate, negative murmurs rubs gallops, DP/PT pulse 2+   Respiratory: Decreased breath sounds on the right, clear to auscultation on the left negative wheezing, negative rales  Abdomen: Soft nontender nondistended plus bowel sounds  Skin: Multiple old and new skin lesions, erythematous painful to palpation especially the ones on the bilateral palms and fingertips, patient has these lesions on L. both left first metatarsal in different states of healing these are nonpainful  Neurologic: Intact  Labs on Admission:  Basic Metabolic Panel:  Recent Labs Lab 10/11/12 0940  NA 140  K 3.2*  CL 104  GLUCOSE 82  BUN 17  CREATININE 0.90   Liver Function Tests: No results found for this basename: AST, ALT, ALKPHOS, BILITOT, PROT, ALBUMIN,  in the last 168 hours No results found for this basename: LIPASE, AMYLASE,  in the last 168 hours No results found for this basename: AMMONIA,  in the last 168 hours CBC:  Recent Labs Lab 10/11/12 0912 10/11/12 0940  WBC 6.8  --   NEUTROABS 6.0  --   HGB 12.9 12.9  HCT 36.6 38.0  MCV 92.9  --   PLT 175  --    Cardiac Enzymes: No results found for this basename: CKTOTAL, CKMB, CKMBINDEX, TROPONINI,  in the last 168 hours  BNP (last 3 results) No results found for this basename: PROBNP,  in the last 8760 hours CBG: No results found for this basename: GLUCAP,  in the last 168 hours  Radiological Exams on Admission: Dg Chest 2 View  10/11/2012   *RADIOLOGY REPORT*  Clinical Data: Chest pain, cough  CHEST - 2 VIEW  Comparison: 04/04/2012  Findings: The cardiac shadow is within normal limits.  Diffuse fibrotic changes are noted throughout both lungs  which have increased in the interval from prior exam.  No focal confluent infiltrate is seen.  Small right-sided pleural effusion is noted.  IMPRESSION: Increase in bilateral fibrotic changes when compared with the prior exam.  No focal confluent infiltrate is seen.   Original Report Authenticated By: Alcide Clever, M.D.   Ct Angio Chest Pe W/cm &/or Wo Cm  10/11/2012   ADDENDUM REPORT: 10/11/2012 12:33  ADDENDUM: The original report was by Dr. Gaylyn Rong. The following addendum is by Dr. Gaylyn Rong:  The 1st paragraph under FINDINGS should read:  No filling defect is identified in the pulmonary arterial tree to suggest pulmonary embolus. No aortic dissection observed.   Electronically Signed   By: Herbie Baltimore   On: 10/11/2012 12:33   10/11/2012   CLINICAL DATA:  Right chest pain.  EXAM: CT ANGIOGRAPHY CHEST WITH CONTRAST  TECHNIQUE: Multidetector CT imaging of the chest was performed using the standard protocol during bolus administration of intravenous contrast. Multiplanar CT image reconstructions including MIPs were obtained to evaluate the vascular anatomy.  CONTRAST:  OMNIPAQUE IOHEXOL  350 MG/ML SOLN  COMPARISON:  10/11/2012  FINDINGS: Insert skip embolus no aortic dissection observed.  Moderate right pleural effusion noted with scattered peripheral and subpleural densities and scattered irregular nodularity in both lungs. A 1.3 x 1.7 cm nodule in the left lower lobe on image 45 of series 6 appears to be cavitary with a thick wall.  Bilateral axillary lymph nodes noted, short axis diameter up to 1.5 cm. Small pericardial effusion. Scattered hypodense lesions in the liver, some of which appear irregular but sharply defined, and potentially with internal septations.  No definite thickening of the airways although some of the nodularity is superior bronchovascular. A sclerotic 1 cm lesion with central lucency is present in the right 2nd rib laterally on image 21 of series 6, and is  technically nonspecific.  Review of the MIP images confirms the above findings.  IMPRESSION: 1. Peripheral irregularity in nodules primarily along the subpleural regions, with some associated interstitial accentuation and with some pericolonic bronchovascular nodules. These nodules are irregular, and one of the nodules appears to be cavitary. Differential diagnostic considerations include sarcoidosis, septic emboli, cryptogenic organizing pneumonia, Churg-Strauss syndrome, chronic eosinophilic pneumonia, tuberculosis, and Wegener's granulomatosis. 2. Moderate right pleural effusion. Small pericardial effusion. 3. Scattered the somewhat irregular hypodense lesions in the liver may be septated and early partially characterized on today's exam. 4. No pulmonary embolus observed. 5. Small sclerotic lesion with central lucency in the right 2nd rib laterally, likely incidental.  Electronically Signed: By: Herbie Baltimore On: 10/11/2012 12:25    EKG: 8/29th/2014 initial EKG poor quality showing sinus tachycardia P. repeat EKG pending      Assessment/Plan Active Problems:   * No active hospital problems. *   1. Thrombotic vasculitis; per PCCM (Dr. Rory Percy); -IV dose steroids, would avoid high dose until dx known: avoid harm immune suppression, may in fact need higher dosing of steroids, follow clinical course  -may in fact need escalation of other immune suppressant meds (cytoxane etc)  -assess autoimmune panel, have reviewed her outpt work up in full with her. Never had anca etc  -Hep C negative as outpatient perpt  -Please notify Dr. Jon Billings to evaluate in house as autoimmune panel is returned: she has appt for October to change to this new rheum  -assess HIV  -hold abx for now  -monitor glucose with steriods  -if autoimmune panel negative, would consider going straight to VATS biopsy. Would need larger tissue sample than FOB could provide or CT guidance  -diflucan 150 mg x1 for oral  thrush  -assess LE for DVT given vasculitis & swelling  UA for active sediment  ACE level  Favor bx overall if work up neg and not clear from labs / autoimmune panel --- Although patient states has obtained infectious disease panel past unable to locate on epic will obtain HIV, hepatitis, RPR, TB  2. HTN; continue home meds  3. pleural effusion; defer to PCCM (possible pleurocentesis)  4. Arthralgia; see #1    Code Status: Full Family Communication: Family in room for discussion of plan of care  Disposition Plan: Per PCCM  Time spent: 60 minutes  Bashar Milam, Roselind Messier [facility] Pager [phone]  If 7PM-7AM, please contact night-coverage [email or web address] Password [facility] 10/11/2012, 3:17 PM

## 2012-10-11 NOTE — ED Provider Notes (Signed)
Medical screening examination/treatment/procedure(s) were performed by non-physician practitioner and as supervising physician I was immediately available for consultation/collaboration.   Shanna Cisco, MD 10/11/12 [phone]

## 2012-10-11 NOTE — ED Notes (Signed)
Approx 4 days ago, pt began having right arm and right rib pain. Denies injury or trauma. Pt with cough for 2-3 months.

## 2012-10-11 NOTE — ED Notes (Signed)
PA at bedside.

## 2012-10-11 NOTE — Consult Note (Signed)
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 Seen by [name] in Feb 2014 for pulm infiltrates that cleared w/steroids and abx. Admitted on 8/29 w/ CC: right rib pain. CT chest to r/o PE showed diffuse nodular airspace disease w/small right effusion & PCCM asked to assess.   SIGNIFICANT EVENTS / STUDIES:  8/29 CT chest>>> subpleural and bronchovascular nodules, small right effusion and a small LLL nodule which appeared to be cavitary.  Neg for PE.   LINES / TUBES:   CULTURES:   ANTIBIOTICS:   HISTORY OF PRESENT ILLNESS:   47 year old female with history of recurrent cough, hypertension, multiple thyroid nodules presents 8/29 complaining of rib pains and right arm pain. Endorses dry cough for the past 2-3 months. For the past 4 days prior to admit she has had pain to the right ribs worsening when lying down with occasional sharp shooting pains down her right arm. Pain is not improved after taking Advil. No associated injury or trauma. No complaints of fever, chills, productive cough, hemoptysis, dyspnea or exertion, and abdominal pain, back pain, nausea, vomiting, diarrhea. Patient has rash to both hands and also complaining of pain in the elbow and shoulder joint. 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 No prior history of PE DVT. CT of chest was obtained which showed both subpleural and bronchovascular nodules, small right effusion and a small LLL nodule which appeared to be cavitary.  This was the reason for PCCM consult.   Patient denies fevers,chills, n/v/d.  In past has had some joint pain relief with NSAIDs but began having dark tarry  stools and stopped taking aleve.  Notes cutaneous skin eruptions, dry eyelids with peeling.  Has had an extensive work up for autoimmune process to include skin biopsy.  Is planned to see Rheumatology at [facility].    PAST MEDICAL HISTORY :  Past Medical History  Diagnosis Date  . Hypertension   . Multiple thyroid nodules   . History of cold sores   had been seen by Dr [name] in Feb 2014 with abnormal CXR/ pulm infiltrates . He felt this improved with steroids and abx.    Past Surgical History  Procedure Laterality Date  . Abdominal hysterectomy     Prior to Admission medications   Medication Sig Start Date End Date Taking? Authorizing Provider  Olmesartan-Amlodipine-HCTZ 40-10-25 MG TABS Take 1 tablet by mouth daily. 07/02/12  Yes Corwin Levins, MD  predniSONE (DELTASONE) 10 MG tablet Take 1 tablet (10 mg total) by mouth daily. 08/19/12  Yes Georgina Quint Plotnikov, MD  valACYclovir (VALTREX) 500 MG tablet Take 500 mg by mouth daily as needed. 01/15/12  Yes Tresa Garter, MD   Allergies  Allergen Reactions  . Benazepril Hcl     REACTION: cough  . Ciprofloxacin     REACTION: Rhabdomyolysis  . Tramadol     n/v    FAMILY HISTORY:  Family History  Problem Relation Age of Onset  . Hypertension Other   . Hypertension Mother   . Cancer Father 53    prostate ca   SOCIAL HISTORY:  reports that she has never smoked. She  has never used smokeless tobacco. She reports that she does not drink alcohol or use illicit drugs.  REVIEW OF SYSTEMS:   Constitutional: Negative for fever, chills, weight loss, malaise/fatigue and diaphoresis.  HENT: Negative for hearing loss, ear pain, nosebleeds, congestion, sore throat, neck pain, tinnitus and ear discharge.   Eyes: Negative for blurred vision, double vision, photophobia, pain, discharge and redness.  Respiratory: Negative for cough, hemoptysis, sputum production, shortness of breath, wheezing and stridor.   Cardiovascular: Negative for chest pain,  palpitations, orthopnea, claudication, leg swelling and PND.  Gastrointestinal: Negative for heartburn, nausea, vomiting, abdominal pain, diarrhea, constipation, blood in stool and melena.  Genitourinary: Negative for dysuria, urgency, frequency, hematuria and flank pain.  Musculoskeletal: Negative for myalgias, back pain, joint pain and falls.  Skin: Negative for itching.  See HPI. Neurological: Negative for dizziness, tingling, tremors, sensory change, speech change, focal weakness, seizures, loss of consciousness, weakness and headaches.  Endo/Heme/Allergies: Negative for environmental allergies and polydipsia. Does not bruise/bleed easily.  SEE HPI for further details.    SUBJECTIVE: Pt reports R sided chest pain  VITAL SIGNS: Temp:  [98.3 F (36.8 C)] 98.3 F (36.8 C) (08/29 0840) Pulse Rate:  [101-125] 122 (08/29 1330) Resp:  [16-25] 25 (08/29 1330) BP: (120-144)/(83-98) 132/91 mmHg (08/29 1330) SpO2:  [96 %-100 %] 99 % (08/29 1330) Weight:  [68.04 kg (150 lb)] 68.04 kg (150 lb) (08/29 0840)  PHYSICAL EXAMINATION: General:  wdwn adult female in NAD Neuro:  AAOx4, s[peech clear, MAE HEENT:  Mm pink/moist, no jvd, mild oral thrush Cardiovascular:  s1s2 rrr, no m/r/g, tachy Lungs:  resp's even/non-labored, lungs bilaterally with faint fine crackles posterior Abdomen:  Round/soft, bsx4 active Musculoskeletal:  No acute deformties.  L>R swelling Skin:  Generalized skin eruptions, small round that appear like healing wounds.  No exudate or warmth   Recent Labs Lab 10/11/12 0940  NA 140  K 3.2*  CL 104  BUN 17  CREATININE 0.90  GLUCOSE 82    Recent Labs Lab 10/11/12 0912 10/11/12 0940  HGB 12.9 12.9  HCT 36.6 38.0  WBC 6.8  --   PLT 175  --    Dg Chest 2 View  10/11/2012   *RADIOLOGY REPORT*  Clinical Data: Chest pain, cough  CHEST - 2 VIEW  Comparison: 04/04/2012  Findings: The cardiac shadow is within normal limits.  Diffuse fibrotic changes are noted  throughout both lungs which have increased in the interval from prior exam.  No focal confluent infiltrate is seen.  Small right-sided pleural effusion is noted.  IMPRESSION: Increase in bilateral fibrotic changes when compared with the prior exam.  No focal confluent infiltrate is seen.   Original Report Authenticated By: Alcide Clever, M.D.   Ct Angio Chest Pe W/cm &/or Wo Cm  10/11/2012   ADDENDUM REPORT: 10/11/2012 12:33  ADDENDUM: The original report was by Dr. Gaylyn Rong. The following addendum is by Dr. Gaylyn Rong:  The 1st paragraph under FINDINGS should read:  No filling defect is identified in the pulmonary arterial tree to suggest pulmonary embolus. No aortic dissection observed.   Electronically Signed   By: Herbie Baltimore   On: 10/11/2012 12:33   10/11/2012   CLINICAL DATA:  Right chest pain.  EXAM: CT ANGIOGRAPHY CHEST WITH CONTRAST  TECHNIQUE: Multidetector CT imaging of the chest was performed using the standard protocol during bolus administration of intravenous contrast. Multiplanar CT image reconstructions including MIPs were obtained to evaluate the vascular anatomy.  CONTRAST:   OMNIPAQUE IOHEXOL 350 MG/ML SOLN  COMPARISON:  10/11/2012  FINDINGS: Insert skip embolus no aortic dissection observed.  Moderate right pleural effusion noted with scattered peripheral and subpleural densities and scattered irregular nodularity in both lungs. A 1.3 x 1.7 cm nodule in the left lower lobe on image 45 of series 6 appears to be cavitary with a thick wall.  Bilateral axillary lymph nodes noted, short axis diameter up to 1.5 cm. Small pericardial effusion. Scattered hypodense lesions in the liver, some of which appear irregular but sharply defined, and potentially with internal septations.  No definite thickening of the airways although some of the nodularity is superior bronchovascular. A sclerotic 1 cm lesion with central lucency is present in the right 2nd rib laterally on image 21 of  series 6, and is technically nonspecific.  Review of the MIP images confirms the above findings.  IMPRESSION: 1. Peripheral irregularity in nodules primarily along the subpleural regions, with some associated interstitial accentuation and with some pericolonic bronchovascular nodules. These nodules are irregular, and one of the nodules appears to be cavitary. Differential diagnostic considerations include sarcoidosis, septic emboli, cryptogenic organizing pneumonia, Churg-Strauss syndrome, chronic eosinophilic pneumonia, tuberculosis, and Wegener's granulomatosis. 2. Moderate right pleural effusion. Small pericardial effusion. 3. Scattered the somewhat irregular hypodense lesions in the liver may be septated and early partially characterized on today's exam. 4. No pulmonary embolus observed. 5. Small sclerotic lesion with central lucency in the right 2nd rib laterally, likely incidental.  Electronically Signed: By: Herbie Baltimore On: 10/11/2012 12:25    ASSESSMENT / PLAN:  Thrombotic Vasculitis Bilateral Pulmonary Nodules [name] Hoarseness  47 y/o AAF with dx of thrombotic vasculitis from Rheumatology.  Complete workup not available in EPIC.  Presents with new R sided pleuritic pain and continued skin eruptions.  Maintenance dose of prednisone is 10 mg QD.  She is planned to see Rheumatology at [facility] the first week in September.  Presumed vasculitis acute flare with pulmonary presentation.  However, differential includes sarcoidosis vs lupus vs vasculitis.    PLAN: -IV dose steroids, would avoid high dose until dx known: avoid harm immune suppression, may in fact need higher dosing of steroids, follow clinical course -may in fact need escalation of other immune suppressant meds (cytoxane etc) -assess autoimmune panel, have reviewed her outpt work up in full with her. Never had anca etc -Hep C negative as outpatient perpt -Please notify Dr. Jon Billings to evaluate in house as autoimmune panel is  returned: she has appt for October to change to this new rheum -assess HIV -hold abx for now -monitor glucose with steriods -if autoimmune panel negative, would consider going straight to VATS biopsy.  Would need larger tissue sample than FOB could provide or CT guidance -diflucan 150 mg x1 for oral thrush -assess LE for DVT given vasculitis & swelling UA for active sediment ACE level Favor bx overall if work up neg and not clear from labs / autoimmune panel  Canary Brim, NP-C [facility] Pgr: [phone] or [phone]  I have fully examined this patient and agree with above findings.    And edited in full  Mcarthur Rossetti. Tyson Alias, MD, FACP Pgr: [phone]  Pulmonary & Critical Care   10/11/2012, 3:30 PM

## 2012-10-11 NOTE — ED Provider Notes (Signed)
CSN: [id number]     Arrival date & time 10/11/12  7829 History   First MD Initiated Contact with Patient 10/11/12 [phone]     Chief Complaint  Patient presents with  . Arm Pain  . Chest Pain   (Consider location/radiation/quality/duration/timing/severity/associated sxs/prior Treatment) HPI  47 year old female with history of recurrent cough, hypertension, multiple thyroid nodules presents complaining of rib pains and right arm pain.  Patient presents with a dry cough for the past 2-3 months. For the past 4 days she has had pain to the right ribs worsening when lying down with occasional sharp shooting pains down her right arm. Pain is not improved after taking Advil. No associated injury or trauma. No complaints of fever, chills, productive cough, hemoptysis, dyspnea or exertion, and abdominal pain, back pain, nausea, vomiting, diarrhea.  Patient has rash to both hands and also complaining of pain in the elbow and shoulder joint. Was diagnosed with having vasculitis and currently followup with a rheumatologist for that. No prior history of PE DVT. No prior history of cardiac disease. No history of diabetes.  Past Medical History  Diagnosis Date  . Hypertension   . Multiple thyroid nodules   . History of cold sores    Past Surgical History  Procedure Laterality Date  . Abdominal hysterectomy     Family History  Problem Relation Age of Onset  . Hypertension Other   . Hypertension Mother   . Cancer Father 39    prostate ca   History  Substance Use Topics  . Smoking status: Never Smoker   . Smokeless tobacco: Never Used  . Alcohol Use: No   OB History   Grav Para Term Preterm Abortions TAB SAB Ect Mult Living                 Review of Systems  All other systems reviewed and are negative.    Allergies  Benazepril hcl; Ciprofloxacin; and Tramadol  Home Medications   Current Outpatient Rx  Name  Route  Sig  Dispense  Refill  . Olmesartan-Amlodipine-HCTZ 40-10-25 MG TABS    Oral   Take 1 tablet by mouth daily.   90 tablet   3   . predniSONE (DELTASONE) 10 MG tablet   Oral   Take 1 tablet (10 mg total) by mouth daily.   100 tablet   1   . valACYclovir (VALTREX) 500 MG tablet   Oral   Take 500 mg by mouth daily as needed.          BP 133/91  Pulse 116  Temp(Src) 98.3 F (36.8 C) (Oral)  Resp 19  Ht 5\' 6"  (1.676 m)  Wt 150 lb (68.04 kg)  BMI 24.22 kg/m2  SpO2 100% Physical Exam  Nursing note and vitals reviewed. Constitutional: She is oriented to person, place, and time. She appears well-developed and well-nourished. No distress.  Awake, alert, nontoxic appearance  HENT:  Head: Atraumatic.  Mouth/Throat: Oropharynx is clear and moist.  Eyes: Conjunctivae are normal. Right eye exhibits no discharge. Left eye exhibits no discharge.  Neck: Neck supple.  Cardiovascular: Regular rhythm.   Tachycardia without murmurs, rubs, or gallops noted  Pulmonary/Chest: Effort normal. No respiratory distress. She exhibits no tenderness (R anterior chest wall tenderness on palpation without crepitus, paradoxical movement, or emphysema).  Faint crackles heard at the base of lungs with no obvious rales, rhonchi, or wheezes  Abdominal: Soft. There is no tenderness. There is no rebound.  Musculoskeletal: She exhibits no edema and  no tenderness (tenderness to right elbow and right shoulder  with full range of motion and no overlying skin changes.).  ROM appears intact, no obvious focal weakness  Neurological: She is alert and oriented to person, place, and time.  Mental status and motor strength appears intact  Skin: No rash (Vasculitic rash noted to Palms of hands bilaterally, with minimal tenderness) noted.  Psychiatric: She has a normal mood and affect.    ED Course  Procedures (including critical care time)   Date: 10/11/2012  Rate: 115  Rhythm: sinus tachycardia  QRS Axis: normal  Intervals: QT prolonged  ST/T Wave abnormalities: nonspecific T wave  changes  Conduction Disutrbances:none  Narrative Interpretation:   Old EKG Reviewed: none available    9:17 AM Patient with prolonged cough and right anterior rib pain. Wrist pain may be secondary to her cough. However she does have crackles at the base of the lung and prior history of PE, will obtain chest x-ray. She also was diagnosed with having vascular rash likely secondary to RA.  She is tachycardic with chest pain, may consider chest CTA to rule out PE.  Care discussed with attending.   1:12 PM Chest CTA shows peripheral irregular nodules with DDx includes: sarcoidosis, septic emboli, cryptogenic organizing pneumonia, Churg-Strauss Syndrome, Chronic eosinophilic pneumonia, TB, and Wegener's granulomatosis.  No evidence of PE.  Result were discussed with pt.  Pt remains tachycardic but otherwise nontoxic in appearance.  Pt sts she was diagnosed with PNA in January and subsequently developed vasculitis as well.  Her pulmonologist is Dr. [name].  I have consulted oncall intensivist/pulmonologist Dr. Tyson Alias who instruct to have pt admitted under his care to tele bed for further management.    2:21 PM Dr. Tyson Alias request [facility] for admission and he will continue pts management once admitted.  Pt continues to remain tachycardic.  No prior sxs to suggest TB including abnormal weight changes, night sweat, hemoptysis, fever.    3:25 PM i have consulted with [facility] Hospitalist, Dr. Joseph Art who agrees to admit pt to Team 10, negative pressure room, under his care.  Dr. Tyson Alias will also continue to care for pt as well.  Pt is aware and agrees with plan.   Labs Review Labs Reviewed  CBC WITH DIFFERENTIAL - Abnormal; Notable for the following:    Neutrophils Relative % 88 (*)    Lymphocytes Relative 8 (*)    Lymphs Abs 0.5 (*)    All other components within normal limits  SEDIMENTATION RATE - Abnormal; Notable for the following:    Sed Rate 50 (*)    All other components within normal limits   POCT I-STAT, CHEM 8 - Abnormal; Notable for the following:    Potassium 3.2 (*)    Calcium, Ion 1.08 (*)    All other components within normal limits  CG4 I-STAT (LACTIC ACID)  POCT I-STAT TROPONIN I   Imaging Review Dg Chest 2 View  10/11/2012   *RADIOLOGY REPORT*  Clinical Data: Chest pain, cough  CHEST - 2 VIEW  Comparison: 04/04/2012  Findings: The cardiac shadow is within normal limits.  Diffuse fibrotic changes are noted throughout both lungs which have increased in the interval from prior exam.  No focal confluent infiltrate is seen.  Small right-sided pleural effusion is noted.  IMPRESSION: Increase in bilateral fibrotic changes when compared with the prior exam.  No focal confluent infiltrate is seen.   Original Report Authenticated By: Alcide Clever, M.D.   Ct Angio Chest Pe W/cm &/  or Wo Cm  10/11/2012   ADDENDUM REPORT: 10/11/2012 12:33  ADDENDUM: The original report was by Dr. Gaylyn Rong. The following addendum is by Dr. Gaylyn Rong:  The 1st paragraph under FINDINGS should read:  No filling defect is identified in the pulmonary arterial tree to suggest pulmonary embolus. No aortic dissection observed.   Electronically Signed   By: Herbie Baltimore   On: 10/11/2012 12:33   10/11/2012   CLINICAL DATA:  Right chest pain.  EXAM: CT ANGIOGRAPHY CHEST WITH CONTRAST  TECHNIQUE: Multidetector CT imaging of the chest was performed using the standard protocol during bolus administration of intravenous contrast. Multiplanar CT image reconstructions including MIPs were obtained to evaluate the vascular anatomy.  CONTRAST:  OMNIPAQUE IOHEXOL 350 MG/ML SOLN  COMPARISON:  10/11/2012  FINDINGS: Insert skip embolus no aortic dissection observed.  Moderate right pleural effusion noted with scattered peripheral and subpleural densities and scattered irregular nodularity in both lungs. A 1.3 x 1.7 cm nodule in the left lower lobe on image 45 of series 6 appears to be cavitary with a thick  wall.  Bilateral axillary lymph nodes noted, short axis diameter up to 1.5 cm. Small pericardial effusion. Scattered hypodense lesions in the liver, some of which appear irregular but sharply defined, and potentially with internal septations.  No definite thickening of the airways although some of the nodularity is superior bronchovascular. A sclerotic 1 cm lesion with central lucency is present in the right 2nd rib laterally on image 21 of series 6, and is technically nonspecific.  Review of the MIP images confirms the above findings.  IMPRESSION: 1. Peripheral irregularity in nodules primarily along the subpleural regions, with some associated interstitial accentuation and with some pericolonic bronchovascular nodules. These nodules are irregular, and one of the nodules appears to be cavitary. Differential diagnostic considerations include sarcoidosis, septic emboli, cryptogenic organizing pneumonia, Churg-Strauss syndrome, chronic eosinophilic pneumonia, tuberculosis, and Wegener's granulomatosis. 2. Moderate right pleural effusion. Small pericardial effusion. 3. Scattered the somewhat irregular hypodense lesions in the liver may be septated and early partially characterized on today's exam. 4. No pulmonary embolus observed. 5. Small sclerotic lesion with central lucency in the right 2nd rib laterally, likely incidental.  Electronically Signed: By: Herbie Baltimore On: 10/11/2012 12:25    MDM   1. Chest pain   2. Tachycardia   3. Abnormal CT scan, chest    BP 132/91  Pulse 122  Temp(Src) 98.3 F (36.8 C) (Oral)  Resp 25  Ht 5\' 6"  (1.676 m)  Wt 150 lb (68.04 kg)  BMI 24.22 kg/m2  SpO2 99%  I have reviewed nursing notes and vital signs. I personally reviewed the imaging tests through PACS system  I reviewed available ER/hospitalization records thought the EMR     Fayrene Helper, [place] 10/11/12 1530

## 2012-10-12 ENCOUNTER — Encounter (HOSPITAL_COMMUNITY): Payer: Self-pay | Admitting: *Deleted

## 2012-10-12 DIAGNOSIS — J984 Other disorders of lung: Secondary | ICD-10-CM

## 2012-10-12 DIAGNOSIS — M7989 Other specified soft tissue disorders: Secondary | ICD-10-CM

## 2012-10-12 DIAGNOSIS — M79609 Pain in unspecified limb: Secondary | ICD-10-CM

## 2012-10-12 LAB — CBC WITH DIFFERENTIAL/PLATELET
Basophils Relative: 0 % (ref 0–1)
Eosinophils Absolute: 0 10*3/uL (ref 0.0–0.7)
Hemoglobin: 13.1 g/dL (ref 12.0–15.0)
Lymphs Abs: 0.4 10*3/uL — ABNORMAL LOW (ref 0.7–4.0)
MCH: 32.4 pg (ref 26.0–34.0)
MCHC: 35.5 g/dL (ref 30.0–36.0)
Monocytes Relative: 3 % (ref 3–12)
Neutro Abs: 5.1 10*3/uL (ref 1.7–7.7)
Neutrophils Relative %: 90 % — ABNORMAL HIGH (ref 43–77)
Platelets: 177 10*3/uL (ref 150–400)
RBC: 4.04 MIL/uL (ref 3.87–5.11)

## 2012-10-12 LAB — COMPREHENSIVE METABOLIC PANEL
BUN: 11 mg/dL (ref 6–23)
CO2: 21 mEq/L (ref 19–32)
Calcium: 8.6 mg/dL (ref 8.4–10.5)
GFR calc Af Amer: >90 mL/min (ref >90)
GFR calc non Af Amer: >90 mL/min (ref >90)
Glucose, Bld: 109 mg/dL — ABNORMAL HIGH (ref 70–99)
Total Protein: 6.7 g/dL (ref 6.0–8.3)

## 2012-10-12 LAB — MAGNESIUM: Magnesium: 1.6 mg/dL (ref 1.5–2.5)

## 2012-10-12 LAB — TSH: TSH: 0.576 u[IU]/mL (ref 0.350–4.500)

## 2012-10-12 LAB — HIV ANTIBODY (ROUTINE TESTING W REFLEX): HIV: NONREACTIVE

## 2012-10-12 MED ORDER — IBUPROFEN 400 MG PO TABS
400.0000 mg | ORAL_TABLET | Freq: Four times a day (QID) | ORAL | Status: DC | PRN
  Administered 2012-10-13 – 2012-10-15 (×3): 400 mg via ORAL
  Filled 2012-10-12 (×5): qty 1

## 2012-10-12 MED ORDER — ACETAMINOPHEN 325 MG PO TABS
650.0000 mg | ORAL_TABLET | Freq: Four times a day (QID) | ORAL | Status: DC | PRN
  Administered 2012-10-12: 650 mg via ORAL
  Filled 2012-10-12: qty 2

## 2012-10-12 NOTE — Progress Notes (Signed)
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 Seen by Dr [name] in Feb 2014 for pulm infiltrates that cleared w/steroids and abx. Admitted on 8/29 w/ CC: right rib pain. CT chest to r/o PE showed diffuse nodular airspace disease w/small right effusion & PCCM asked to assess.   SIGNIFICANT EVENTS / STUDIES:  8/29 CT chest>>> subpleural and bronchovascular nodules, small right effusion and a small LLL nodule which appeared to be cavitary.  Neg for PE.   LINES / TUBES:   CULTURES:   ANTIBIOTICS:    SUBJECTIVE: Pt reports R sided chest pain  VITAL SIGNS: Temp:  [97.9 F (36.6 C)-98.7 F (37.1 C)] 97.9 F (36.6 C) (08/30 0558) Pulse Rate:  [101-147] 122 (08/30 0558) Resp:  [16-26] 20 (08/30 0558) BP: (118-154)/(74-99) 137/97 mmHg (08/30 0558) SpO2:  [96 %-100 %] 100 % (08/30 0558) Weight:  [65.7 kg (144 lb 13.5 oz)-66.4 kg (146 lb 6.2 oz)] 66.4 kg (146 lb 6.2 oz) (08/30 0558)  PHYSICAL EXAMINATION: General:  wdwn adult female in NAD Neuro:  AAOx4, s[peech clear, MAE HEENT:  Mm pink/moist, no jvd, mild oral thrush Cardiovascular:  s1s2 rrr, no m/r/g, tachy Lungs:  resp's even/non-labored, lungs bilaterally with faint fine crackles posterior. Minimal dullness R base Abdomen:  Round/soft, bsx4 active Musculoskeletal:  No acute deformties.  L>R swelling, ambulatory Skin:  Generalized skin eruptions, small round that appear like healing wounds.  No exudate or warmth   Recent Labs Lab 10/11/12 0940 10/12/12 0500  NA 140 134*  K 3.2* 3.7  CL 104 100  CO2  --  21  BUN 17 11  CREATININE 0.90 0.53  GLUCOSE 82 109*    Recent Labs Lab 10/11/12 0912 10/11/12 0940 10/12/12 0500  HGB 12.9 12.9  13.1  HCT 36.6 38.0 36.9  WBC 6.8  --  5.7  PLT 175  --  177   Dg Chest 2 View  10/11/2012   *RADIOLOGY REPORT*  Clinical Data: Chest pain, cough  CHEST - 2 VIEW  Comparison: 04/04/2012  Findings: The cardiac shadow is within normal limits.  Diffuse fibrotic changes are noted throughout both lungs which have increased in the interval from prior exam.  No focal confluent infiltrate is seen.  Small right-sided pleural effusion is noted.  IMPRESSION: Increase in bilateral fibrotic changes when compared with the prior exam.  No focal confluent infiltrate is seen.   Original Report Authenticated By: Alcide Clever, M.D.   Ct Angio Chest Pe W/cm &/or Wo Cm  10/11/2012   ADDENDUM REPORT: 10/11/2012 12:33  ADDENDUM: The original report was by Dr. Gaylyn Rong. The following addendum is by Dr. Gaylyn Rong:  The 1st paragraph under FINDINGS should read:  No filling defect is identified in the pulmonary arterial tree to suggest pulmonary embolus. No aortic dissection observed.   Electronically Signed   By: Herbie Baltimore   On: 10/11/2012 12:33   10/11/2012   CLINICAL DATA:  Right chest pain.  EXAM: CT ANGIOGRAPHY CHEST WITH CONTRAST  TECHNIQUE: Multidetector CT imaging of the chest was performed using the standard protocol during bolus administration of intravenous contrast. Multiplanar CT image reconstructions including MIPs were obtained to evaluate the vascular  anatomy.  CONTRAST:  OMNIPAQUE IOHEXOL 350 MG/ML SOLN  COMPARISON:  10/11/2012  FINDINGS: Insert skip embolus no aortic dissection observed.  Moderate right pleural effusion noted with scattered peripheral and subpleural densities and scattered irregular nodularity in both lungs. A 1.3 x 1.7 cm nodule in the left lower lobe on image 45 of series 6 appears to be cavitary with a thick wall.  Bilateral axillary lymph nodes noted, short axis diameter up to 1.5 cm. Small pericardial effusion. Scattered hypodense lesions in the liver, some of  which appear irregular but sharply defined, and potentially with internal septations.  No definite thickening of the airways although some of the nodularity is superior bronchovascular. A sclerotic 1 cm lesion with central lucency is present in the right 2nd rib laterally on image 21 of series 6, and is technically nonspecific.  Review of the MIP images confirms the above findings.  IMPRESSION: 1. Peripheral irregularity in nodules primarily along the subpleural regions, with some associated interstitial accentuation and with some pericolonic bronchovascular nodules. These nodules are irregular, and one of the nodules appears to be cavitary. Differential diagnostic considerations include sarcoidosis, septic emboli, cryptogenic organizing pneumonia, Churg-Strauss syndrome, chronic eosinophilic pneumonia, tuberculosis, and Wegener's granulomatosis. 2. Moderate right pleural effusion. Small pericardial effusion. 3. Scattered the somewhat irregular hypodense lesions in the liver may be septated and early partially characterized on today's exam. 4. No pulmonary embolus observed. 5. Small sclerotic lesion with central lucency in the right 2nd rib laterally, likely incidental.  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  Complete workup not available in EPIC.  Presents with new R sided pleuritic pain and continued skin eruptions.  Maintenance dose of prednisone is 10 mg QD.  She is planned to see Rhe[facility] at [facility] the first week in September.  Presumed vasculitis acute flare with pulmonary presentation.  However, differential includes sarcoidosis vs lupus vs vasculitis.    8/30- She is in no distress now and appropriate to await pending labs. Small R effusion could be tapped under ultrasound, but  tissue bx may be more useful. ACE elevated, but can be an acute phase reactant. Pleural effusions are uncommon w/ Sarcoid. Agree with recommendations from original consult:  PLAN: -IV dose steroids, would avoid high dose until dx known: avoid harm immune suppression, may in fact need higher dosing of steroids, follow clinical course -may in fact need escalation of other immune suppressant meds (cytoxane etc) -assess autoimmune panel, have reviewed her outpt work up in full with her. Never had anca etc -Hep C negative as outpatient perpt -Please notify Dr. Jon Billings to evaluate in house as autoimmune panel is returned: she has appt for October to change to this new rheum -assess HIV -hold abx for now -monitor glucose with steriods -if autoimmune panel negative, would consider going straight to VATS biopsy.  Would need larger tissue sample than FOB could provide or CT guidance -diflucan 150 mg x1 for oral thrush -assess LE for DVT given vasculitis & swelling UA for active sediment ACE level Favor bx overall if work up neg and not clear from labs / autoimmune panel   Please call PCCM for in-put before Tuesday if needed.  CD Maple Hudson, MD PCCM m [phone] [facility]   10/12/2012, 10:53 AM

## 2012-10-12 NOTE — Progress Notes (Signed)
VASCULAR LAB PRELIMINARY  PRELIMINARY  PRELIMINARY  PRELIMINARY  Bilateral lower extremity venous Dopplers completed.    Preliminary report:  There is no DVT or SVT noted in the bilateral lower extremities.    California Huberty, RVT 10/12/2012, 11:45 AM

## 2012-10-12 NOTE — Progress Notes (Signed)
[facility] PROGRESS NOTE  Jamie [name] BJY:[id number] DOB: 21-Nov-1965 DOA: 10/11/2012 PCP: Sonda Primes, MD  Assessment/Plan:  Suspected acute vasculitis flare Patient recently diagnosed with thrombotic vasculitis. Had been on 10 mg daily of prednisone. She presented initially overnight with complaints of rib pain, CT scan showing multiple nodules. Pulmonary medicine consulted. She has been placed on IV steroids with Solu-Medrol 40 mg IV every 12 hours. Will monitor response to IV steroids, followup on lab work. Will request rheumatology consultation.   Pulmonary nodules CT scan showing peripheral irregularity in nodules primarily along the subpleural regions characterized as irregular. There is concern about one of the nodules appear to be cavitary. Patient was placed in the negative pressure room, undergoing TB workup. Nodules could represent pulmonary manifestations of systemic autoimmune disease. She denies fevers, chills, cough, shortness of breath, sputum production, weight loss, night sweats, or constitutional symptoms. Pulmonary medicine consulted. For now holding off on antimicrobial therapy. If autoimmune panel remains negative, there may be consideration for biopsy. Meanwhile will continue Solu-Medrol  IV.  Pleural effusion Imaging studies overnight showing evidence of moderate sized right sided pleural effusion. Could represent pulmonary manifestations of rheumatoid arthritis. Patient is in no acute respiratory distress.  Hypertension Continue amlodipine and irbesartan.  DVT prophylaxis: Enoxaparin subcutaneous    Code Status: We'll to Family Communication: I discussed plan with patient and husband present at bedside Disposition Plan: Continue IV steroids, we'll hold off on antibiotic therapy for now. Followup on rheumatologic workup in rheumatology recommendations    Consultants:  Pulmonary critical care   Antibiotics:  Antimicrobial therapy been  held  HPI/Subjective: Minutes Jamie [name] is a pleasant 47 year old female recently diagnosed with thrombotic vasculitis and possibly rheumatoid arthritis, presented to my department overnight with complaints of right rib pain. A CT scan of the lungs with IV contrast showing nodules primarily along the subpleural regions, characterized as a regular with one of the mouth nodules appear to be cavitary. Dr. Tyson Alias of pulmonary medicine has been consulted, she is presently in a negative pressure room, rule out TB. This morning Jamie [name] has no new complaints, denies fevers, chills, night sweats, tolerating by mouth intake.  Objective: Filed Vitals:   10/12/12 0558  BP: 137/97  Pulse: 122  Temp: 97.9 F (36.6 C)  Resp: 20    Intake/Output Summary (Last 24 hours) at 10/12/12 0865 Last data filed at 10/12/12 0600  Gross per 24 hour  Intake   1360 ml  Output    900 ml  Net    460 ml   Filed Weights   10/11/12 0840 10/11/12 1728 10/12/12 0558  Weight: 68.04 kg (150 lb) 65.7 kg (144 lb 13.5 oz) 66.4 kg (146 lb 6.2 oz)    Exam:   General:  Patient is in no acute distress she denies any complaints this morning. Tolerating by mouth intake.  Cardiovascular: Regular rate and rhythm normal S1-S2 no murmurs rubs  Respiratory: Positive crackles to left side otherwise normal respiratory effort, no wheezing or rhonchi, although supplemental oxygen  Abdomen: Abdomen is soft nontender nontender positive bowel sounds in upper quad  Musculoskeletal: Present range of motion of all extremities  Skin: Patient having erythematous lesions over bilateral fingertips as well as palms with a few lesions also in her right foot  Data Reviewed: Basic Metabolic Panel:  Recent Labs Lab 10/11/12 0940 10/12/12 0500  NA 140 134*  K 3.2* 3.7  CL 104 100  CO2  --  21  GLUCOSE 82 109*  BUN 17 11  CREATININE 0.90 0.53  CALCIUM  --  8.6  MG  --  1.6   Liver Function Tests:  Recent Labs Lab  10/12/12 0500  AST 40*  ALT 25  ALKPHOS 59  BILITOT 0.4  PROT 6.7  ALBUMIN 2.7*   No results found for this basename: LIPASE, AMYLASE,  in the last 168 hours No results found for this basename: AMMONIA,  in the last 168 hours CBC:  Recent Labs Lab 10/11/12 0912 10/11/12 0940 10/12/12 0500  WBC 6.8  --  5.7  NEUTROABS 6.0  --  5.1  HGB 12.9 12.9 13.1  HCT 36.6 38.0 36.9  MCV 92.9  --  91.3  PLT 175  --  177   Cardiac Enzymes: No results found for this basename: CKTOTAL, CKMB, CKMBINDEX, TROPONINI,  in the last 168 hours BNP (last 3 results) No results found for this basename: PROBNP,  in the last 8760 hours CBG: No results found for this basename: GLUCAP,  in the last 168 hours  No results found for this or any previous visit (from the past 240 hour(s)).   Studies: Dg Chest 2 View  10/11/2012   *RADIOLOGY REPORT*  Clinical Data: Chest pain, cough  CHEST - 2 VIEW  Comparison: 04/04/2012  Findings: The cardiac shadow is within normal limits.  Diffuse fibrotic changes are noted throughout both lungs which have increased in the interval from prior exam.  No focal confluent infiltrate is seen.  Small right-sided pleural effusion is noted.  IMPRESSION: Increase in bilateral fibrotic changes when compared with the prior exam.  No focal confluent infiltrate is seen.   Original Report Authenticated By: Alcide Clever, M.D.   Ct Angio Chest Pe W/cm &/or Wo Cm  10/11/2012   ADDENDUM REPORT: 10/11/2012 12:33  ADDENDUM: The original report was by Dr. Gaylyn Rong. The following addendum is by Dr. Gaylyn Rong:  The 1st paragraph under FINDINGS should read:  No filling defect is identified in the pulmonary arterial tree to suggest pulmonary embolus. No aortic dissection observed.   Electronically Signed   By: Herbie Baltimore   On: 10/11/2012 12:33   10/11/2012   CLINICAL DATA:  Right chest pain.  EXAM: CT ANGIOGRAPHY CHEST WITH CONTRAST  TECHNIQUE: Multidetector CT imaging of the  chest was performed using the standard protocol during bolus administration of intravenous contrast. Multiplanar CT image reconstructions including MIPs were obtained to evaluate the vascular anatomy.  CONTRAST:  OMNIPAQUE IOHEXOL 350 MG/ML SOLN  COMPARISON:  10/11/2012  FINDINGS: Insert skip embolus no aortic dissection observed.  Moderate right pleural effusion noted with scattered peripheral and subpleural densities and scattered irregular nodularity in both lungs. A 1.3 x 1.7 cm nodule in the left lower lobe on image 45 of series 6 appears to be cavitary with a thick wall.  Bilateral axillary lymph nodes noted, short axis diameter up to 1.5 cm. Small pericardial effusion. Scattered hypodense lesions in the liver, some of which appear irregular but sharply defined, and potentially with internal septations.  No definite thickening of the airways although some of the nodularity is superior bronchovascular. A sclerotic 1 cm lesion with central lucency is present in the right 2nd rib laterally on image 21 of series 6, and is technically nonspecific.  Review of the MIP images confirms the above findings.  IMPRESSION: 1. Peripheral irregularity in nodules primarily along the subpleural regions, with some associated interstitial accentuation and with some pericolonic bronchovascular nodules. These nodules are irregular, and  one of the nodules appears to be cavitary. Differential diagnostic considerations include sarcoidosis, septic emboli, cryptogenic organizing pneumonia, Churg-Strauss syndrome, chronic eosinophilic pneumonia, tuberculosis, and Wegener's granulomatosis. 2. Moderate right pleural effusion. Small pericardial effusion. 3. Scattered the somewhat irregular hypodense lesions in the liver may be septated and early partially characterized on today's exam. 4. No pulmonary embolus observed. 5. Small sclerotic lesion with central lucency in the right 2nd rib laterally, likely incidental.  Electronically  Signed: By: Herbie Baltimore On: 10/11/2012 12:25    Scheduled Meds: . irbesartan  300 mg Oral Daily   And  . amLODipine  10 mg Oral Daily   And  . hydrochlorothiazide  25 mg Oral Daily  . enoxaparin (LOVENOX) injection  40 mg Subcutaneous Q24H  . methylPREDNISolone (SOLU-MEDROL) injection  40 mg Intravenous Q12H   Continuous Infusions:   Principal Problem:   Rheumatoid vasculitis Active Problems:   THYROID NODULE   HYPERTENSION   Arthralgia   Rash and nonspecific skin eruption   Pleural effusion    Time spent: 40 minutes    Jeralyn Bennett  [facility] Pager [phone]. If 7PM-7AM, please contact night-coverage at [email or web address], password [facility] 10/12/2012, 9:22 AM  LOS: 1 day

## 2012-10-13 DIAGNOSIS — R918 Other nonspecific abnormal finding of lung field: Secondary | ICD-10-CM | POA: Diagnosis present

## 2012-10-13 HISTORY — DX: Other nonspecific abnormal finding of lung field: R91.8

## 2012-10-13 LAB — CBC
Platelets: 228 10*3/uL (ref 150–400)
RBC: 4.23 MIL/uL (ref 3.87–5.11)
WBC: 12.9 10*3/uL — ABNORMAL HIGH (ref 4.0–10.5)

## 2012-10-13 MED ORDER — PREDNISONE 20 MG PO TABS
40.0000 mg | ORAL_TABLET | Freq: Every day | ORAL | Status: DC
  Administered 2012-10-14 – 2012-10-21 (×7): 40 mg via ORAL
  Filled 2012-10-13 (×11): qty 2

## 2012-10-13 NOTE — Progress Notes (Signed)
[facility] PROGRESS NOTE  CHUDNEY SCHEFFLER ZOX:[id number] DOB: 1965-05-28 DOA: 10/11/2012 PCP: Sonda Primes, MD  Assessment/Plan:  Suspected acute vasculitis flare Patient recently diagnosed with thrombotic vasculitis. Had been on 10 mg daily of prednisone. She presented initially overnight with complaints of rib pain, CT scan showing multiple nodules. Pulmonary medicine consulted. She has been placed on IV steroids with Solu-Medrol 40 mg IV every 12 hours. Complement levels within normal limits. Will transition to oral prednisone at 40 mg by mouth daily. Unclear etiology of pulmonary nodules. Will need outpatient rheumatology followup   Pulmonary nodules CT scan showing peripheral irregularity in nodules primarily along the subpleural regions characterized as irregular. There is concern about one of the nodules appear to be cavitary. Patient was placed in the negative pressure room, undergoing TB workup. Nodules could represent pulmonary manifestations of systemic autoimmune disease. She denies fevers, chills, cough, shortness of breath, sputum production, weight loss, night sweats, or constitutional symptoms. I discussed case with Dr. Maple Hudson of pulmonary critical care. He suggested consulting cardiothoracic surgery for consideration of VATS biopsy. I spoke with [name] who recommended follow up at their office, as patient has remained stable.   Pleural effusion Imaging studies overnight showing evidence of moderate sized right sided pleural effusion. Could represent pulmonary manifestations of rheumatoid arthritis. Patient is in no acute respiratory distress.  Hypertension Continue amlodipine and irbesartan.  DVT prophylaxis: Enoxaparin subcutaneous    Code Status: Full Code Family Communication: I discussed plan with patient and husband present at bedside Disposition Plan: Continue IV steroids, we'll hold off on antibiotic therapy for now. Followup on rheumatologic workup in  rheumatology recommendations    Consultants:  Pulmonary critical care   Antibiotics:  Antimicrobial therapy been held  HPI/Subjective: Minutes Jamie [name] is a pleasant 47 year old female recently diagnosed with thrombotic vasculitis and possibly rheumatoid arthritis, presented to my department overnight with complaints of right rib pain. A CT scan of the lungs with IV contrast showing nodules primarily along the subpleural regions, characterized as a regular with one of the mouth nodules appear to be cavitary. Dr. Tyson Alias of pulmonary medicine has been consulted, she is presently in a negative pressure room, rule out TB. This morning Jamie [name] has no new complaints, denies fevers, chills, night sweats, tolerating by mouth intake.  Objective: Filed Vitals:   10/13/12 0959  BP: 136/97  Pulse:   Temp:   Resp:     Intake/Output Summary (Last 24 hours) at 10/13/12 1529 Last data filed at 10/13/12 0500  Gross per 24 hour  Intake    300 ml  Output    450 ml  Net   -150 ml   Filed Weights   10/11/12 1728 10/12/12 0558 10/13/12 0547  Weight: 65.7 kg (144 lb 13.5 oz) 66.4 kg (146 lb 6.2 oz) 65.227 kg (143 lb 12.8 oz)    Exam:   General:  Patient is in no acute distress she denies any complaints this morning. Tolerating by mouth intake.  Cardiovascular: Regular rate and rhythm normal S1-S2 no murmurs rubs  Respiratory: Positive crackles to left side otherwise normal respiratory effort, no wheezing or rhonchi, although supplemental oxygen  Abdomen: Abdomen is soft nontender nontender positive bowel sounds in upper quad  Musculoskeletal: Present range of motion of all extremities  Skin: Patient having erythematous lesions over bilateral fingertips as well as palms with a few lesions also in her right foot  Data Reviewed: Basic Metabolic Panel:  Recent Labs Lab 10/11/12 0940 10/12/12 0500  NA 140 134*  K 3.2* 3.7  CL 104 100  CO2  --  21  GLUCOSE 82 109*  BUN 17 11   CREATININE 0.90 0.53  CALCIUM  --  8.6  MG  --  1.6   Liver Function Tests:  Recent Labs Lab 10/12/12 0500  AST 40*  ALT 25  ALKPHOS 59  BILITOT 0.4  PROT 6.7  ALBUMIN 2.7*   No results found for this basename: LIPASE, AMYLASE,  in the last 168 hours No results found for this basename: AMMONIA,  in the last 168 hours CBC:  Recent Labs Lab 10/11/12 0912 10/11/12 0940 10/12/12 0500 10/13/12 0855  WBC 6.8  --  5.7 12.9*  NEUTROABS 6.0  --  5.1  --   HGB 12.9 12.9 13.1 14.0  HCT 36.6 38.0 36.9 38.6  MCV 92.9  --  91.3 91.3  PLT 175  --  177 228   Cardiac Enzymes: No results found for this basename: CKTOTAL, CKMB, CKMBINDEX, TROPONINI,  in the last 168 hours BNP (last 3 results) No results found for this basename: PROBNP,  in the last 8760 hours CBG: No results found for this basename: GLUCAP,  in the last 168 hours  No results found for this or any previous visit (from the past 240 hour(s)).   Studies: No results found.  Scheduled Meds: . irbesartan  300 mg Oral Daily   And  . amLODipine  10 mg Oral Daily   And  . hydrochlorothiazide  25 mg Oral Daily  . enoxaparin (LOVENOX) injection  40 mg Subcutaneous Q24H  . methylPREDNISolone (SOLU-MEDROL) injection  40 mg Intravenous Q12H   Continuous Infusions:   Principal Problem:   Rheumatoid vasculitis Active Problems:   THYROID NODULE   HYPERTENSION   Arthralgia   Rash and nonspecific skin eruption   Pleural effusion    Time spent: 35 minutes    Jeralyn Bennett  [facility] Pager [phone]. If 7PM-7AM, please contact night-coverage at [email or web address], password [facility] 10/13/2012, 3:29 PM  LOS: 2 days

## 2012-10-14 ENCOUNTER — Encounter (HOSPITAL_COMMUNITY): Payer: Self-pay | Admitting: Thoracic Surgery (Cardiothoracic Vascular Surgery)

## 2012-10-14 DIAGNOSIS — R000 Tachycardia, unspecified: Secondary | ICD-10-CM

## 2012-10-14 DIAGNOSIS — D381 Neoplasm of uncertain behavior of trachea, bronchus and lung: Secondary | ICD-10-CM

## 2012-10-14 LAB — HEPATITIS PANEL, ACUTE
HCV Ab: NEGATIVE
Hep A IgM: NEGATIVE
Hepatitis B Surface Ag: NEGATIVE

## 2012-10-14 LAB — TYPE AND SCREEN
ABO/RH(D): O POS
Antibody Screen: NEGATIVE

## 2012-10-14 LAB — BLOOD GAS, ARTERIAL
Acid-Base Excess: 0.4 mmol/L (ref 0.0–2.0)
O2 Saturation: 96.6 %
Patient temperature: 98.6
TCO2: 24.8 mmol/L (ref 0–100)
pH, Arterial: 7.461 — ABNORMAL HIGH (ref 7.350–7.450)

## 2012-10-14 LAB — URINALYSIS, ROUTINE W REFLEX MICROSCOPIC
Bilirubin Urine: NEGATIVE
Ketones, ur: NEGATIVE mg/dL
Leukocytes, UA: NEGATIVE
Nitrite: NEGATIVE
Protein, ur: NEGATIVE mg/dL
Urobilinogen, UA: 1 mg/dL (ref 0.0–1.0)

## 2012-10-14 LAB — CBC
HCT: 36.4 % (ref 36.0–46.0)
MCH: 32.1 pg (ref 26.0–34.0)
MCHC: 34.3 g/dL (ref 30.0–36.0)
RDW: 13.9 % (ref 11.5–15.5)

## 2012-10-14 LAB — BASIC METABOLIC PANEL
BUN: 21 mg/dL (ref 6–23)
Chloride: 104 mEq/L (ref 96–112)
Creatinine, Ser: 0.63 mg/dL (ref 0.50–1.10)
GFR calc Af Amer: >90 mL/min (ref >90)
GFR calc non Af Amer: >90 mL/min (ref >90)
Glucose, Bld: 86 mg/dL (ref 70–99)

## 2012-10-14 LAB — PROTIME-INR
INR: 0.91 (ref 0.00–1.49)
Prothrombin Time: 12.1 seconds (ref 11.6–15.2)

## 2012-10-14 NOTE — Progress Notes (Signed)
[facility] PROGRESS NOTE  Jamie [name] ZOX:[id number] DOB: April 14, 1965 DOA: 10/11/2012 PCP: Jamie Primes, MD  Assessment/Plan:  Suspected acute vasculitis flare Patient recently diagnosed with thrombotic vasculitis. Had been on 10 mg daily of prednisone. She presented initially overnight with complaints of rib pain, CT scan showing multiple nodules. Pulmonary medicine consulted. Patient now on oral prednisone 40 mg by mouth daily.  Pulmonary nodules I discussed case with Jamie [name] yesterday of pulmonary medicine. He recommended consulting Jamie [name] of cardiothoracic surgery for consideration of video-assisted thoracoscopy for lung biopsy. Dr Cornelius [name] evaluated patient this morning recommending following up on TB testing prior to proceed with VATS. Since she had not been able to produce sputum, a Quantiferon TB was ordered yesterday. Results are pending.   Pleural effusion Imaging studies overnight showing evidence of moderate sized right sided pleural effusion. Could represent pulmonary manifestations of rheumatoid arthritis. Patient is in no acute respiratory distress.  Hypertension Continue amlodipine and irbesartan.  Tachycardia The patient's heart rates between 70s to low 100s. She has been afebrile, denied pain symptoms. Could be related to underlying lung issue. Will repeat an EKG today.  DVT prophylaxis: Enoxaparin subcutaneous    Code Status: Full Code Family Communication: I discussed plan with patient and husband present at bedside Disposition Plan: Continue IV steroids, we'll hold off on antibiotic therapy for now. Followup on rheumatologic workup in rheumatology recommendations    Consultants:  Pulmonary critical care   Antibiotics:  Antimicrobial therapy been held  HPI/Subjective: Jamie [name] reporting today that she is felt her heart racing. Looking at her vitals, and appears intact greater than as high as 109, fluctuate between 70s to low 100s. She has no  other complaints, denies dizziness lightheadedness presyncope fevers or chills.  Objective: Filed Vitals:   10/14/12 1100  BP: 134/95  Pulse: 102  Temp:   Resp: 18    Intake/Output Summary (Last 24 hours) at 10/14/12 1429 Last data filed at 10/14/12 0526  Gross per 24 hour  Intake     60 ml  Output    500 ml  Net   -440 ml   Filed Weights   10/12/12 0558 10/13/12 0547 10/14/12 0522  Weight: 66.4 kg (146 lb 6.2 oz) 65.227 kg (143 lb 12.8 oz) 64.8 kg (142 lb 13.7 oz)    Exam:   General:  Patient is in no acute distress she denies any complaints this morning. Tolerating by mouth intake.  Cardiovascular: Regular rate and rhythm normal S1-S2 no murmurs rubs  Respiratory: Positive crackles to left side otherwise normal respiratory effort, no wheezing or rhonchi, although supplemental oxygen  Abdomen: Abdomen is soft nontender nontender positive bowel sounds in upper quad  Musculoskeletal: Present range of motion of all extremities  Skin: Patient having erythematous lesions over bilateral fingertips as well as palms with a few lesions also in her right foot  Data Reviewed: Basic Metabolic Panel:  Recent Labs Lab 10/11/12 0940 10/12/12 0500 10/14/12 0410  NA 140 134* 139  K 3.2* 3.7 3.6  CL 104 100 104  CO2  --  21 26  GLUCOSE 82 109* 86  BUN 17 11 21   CREATININE 0.90 0.53 0.63  CALCIUM  --  8.6 8.9  MG  --  1.6  --    Liver Function Tests:  Recent Labs Lab 10/12/12 0500  AST 40*  ALT 25  ALKPHOS 59  BILITOT 0.4  PROT 6.7  ALBUMIN 2.7*   No results found for this basename: LIPASE, AMYLASE,  in the last 168 hours No results found for this basename: AMMONIA,  in the last 168 hours CBC:  Recent Labs Lab 10/11/12 0912 10/11/12 0940 10/12/12 0500 10/13/12 0855 10/14/12 0410  WBC 6.8  --  5.7 12.9* 10.1  NEUTROABS 6.0  --  5.1  --   --   HGB 12.9 12.9 13.1 14.0 12.5  HCT 36.6 38.0 36.9 38.6 36.4  MCV 92.9  --  91.3 91.3 93.3  PLT 175  --  177 228  187   Cardiac Enzymes: No results found for this basename: CKTOTAL, CKMB, CKMBINDEX, TROPONINI,  in the last 168 hours BNP (last 3 results) No results found for this basename: PROBNP,  in the last 8760 hours CBG: No results found for this basename: GLUCAP,  in the last 168 hours  No results found for this or any previous visit (from the past 240 hour(s)).   Studies: No results found.  Scheduled Meds: . irbesartan  300 mg Oral Daily   And  . amLODipine  10 mg Oral Daily   And  . hydrochlorothiazide  25 mg Oral Daily  . enoxaparin (LOVENOX) injection  40 mg Subcutaneous Q24H  . predniSONE  40 mg Oral Q breakfast   Continuous Infusions:   Principal Problem:   Rheumatoid vasculitis Active Problems:   THYROID NODULE   HYPERTENSION   Arthralgia   Rash and nonspecific skin eruption   Pleural effusion   Pulmonary nodules/lesions, multiple    Time spent: 35 minutes    Jamie [name]  [facility] Pager [phone]. If 7PM-7AM, please contact night-coverage at [email or web address], password [facility] 10/14/2012, 2:29 PM  LOS: 3 days

## 2012-10-14 NOTE — Consult Note (Addendum)
[address]       [place] [address]             [phone]          CARDIOTHORACIC SURGERY CONSULTATION REPORT  PCP is Sonda Primes, MD Referring Provider is Jeralyn Bennett, MD Referring Pulmonologist is Waymon Budge, MD Primary Pulmonologist is Sandrea Hughs, MD Rheumatologist is Pollyann Savoy, MD   Reason for consultation:  Possible lung biopsy  HPI:  Patient is a 47 year old African American female from [place] who works in the emergency department here at [employer].  She is a non-smoker, and with exception of hypertension she has been healthy all of her life until this past year. She developed polyarthritis and has been treated by Dr. Kellie Simmering for presumed rheumatoid arthritis.  In January of this year she developed new onset of abrupt dry cough and shortness of breath with bibasilar opacities noted on chest x-ray.  She was treated with antibiotics and steroids and seen in followup by Dr. [name].  She has since then developed hoarseness of voice, a rash on her hands and fingers, and photosensitivity.  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  Over the past 2 weeks she has developed bilateral chest wall pain that is exacerbated with movement. Ultimately she presented to the emergency department on 10/11/2012. She underwent CT scan to gram of the chest to rule out pulmonary embolus. There was no sign of pulmonary embolus but bilateral subpleural pulmonary nodules were appreciated with a small right pleural effusion and one nodule on the left side that was felt to have perhaps a small degree of cavitary change. Pulmonary critical care medicine was consulted and the patient was initially seen by Dr. Tyson Alias. The patient was seen in followup yesterday by Dr. Maple Hudson who recommended cardiothoracic surgical consultation for possible lung biopsy.  The patient reports a  two-week history of dull bilateral chest wall pain it is made worse with movement. She denies any pleuritic chest pain in particular notes that the pain is not exacerbated by taking a deep breath or cough. She has had a intermittent dry nonproductive cough off and on since last January. She denies any productive cough. She has not had any fevers or chills. She has no shortness of breath. She denies any known exposure to patient's with active tuberculosis or other unusual pulmonary pathogens, although she does work in the emergency department. She has no recent history of travel outside of this region.  Past Medical History  Diagnosis Date  . Hypertension   . Multiple thyroid nodules   . History of cold sores   . 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 Photosensitivity 04/17/2012    3/14 new   . Rheumatoid arthritis   . Pulmonary nodules/lesions, multiple 10/13/2012    Past Surgical History  Procedure Laterality Date  . Abdominal hysterectomy      Family History  Problem Relation Age of Onset  . Hypertension Other   . Hypertension Mother   . Cancer Father 88    prostate ca    History   Social History  . Marital Status: Married    Spouse Name: N/A    Number of Children: 1  . Years of Education: N/A   Occupational History  . Admitting at [employer] [facility] [employer]  Social History Main Topics  . Smoking status: Never Smoker   . Smokeless tobacco: Never Used  . Alcohol Use: No  . Drug Use: No  . Sexual Activity: Yes   Other Topics Concern  . Not on file   Social History Narrative  . No narrative on file    Prior to Admission medications   Medication Sig Start Date End Date Taking? Authorizing Provider  Olmesartan-Amlodipine-HCTZ 40-10-25 MG TABS Take 1 tablet by mouth daily. 07/02/12  Yes Corwin Levins, MD  valACYclovir (VALTREX) 500 MG tablet Take 500 mg by mouth daily as needed. 01/15/12  Yes  Tresa Garter, MD    Current Facility-Administered Medications  Medication Dose Route Frequency Provider Last Rate Last Dose  . acetaminophen (TYLENOL) tablet 650 mg  650 mg Oral Q6H PRN Jeralyn Bennett, MD   650 mg at 10/12/12 1816  . irbesartan (AVAPRO) tablet 300 mg  300 mg Oral Daily Drema Dallas, MD   300 mg at 10/14/12 1100   And  . amLODipine (NORVASC) tablet 10 mg  10 mg Oral Daily Drema Dallas, MD   10 mg at 10/14/12 1100   And  . hydrochlorothiazide (HYDRODIURIL) tablet 25 mg  25 mg Oral Daily Drema Dallas, MD   25 mg at 10/14/12 1100  . enoxaparin (LOVENOX) injection 40 mg  40 mg Subcutaneous Q24H Drema Dallas, MD   40 mg at 10/13/12 1953  . ibuprofen (ADVIL,MOTRIN) tablet 400 mg  400 mg Oral Q6H PRN Jeralyn Bennett, MD   400 mg at 10/14/12 0649  . predniSONE (DELTASONE) tablet 40 mg  40 mg Oral Q breakfast Jeralyn Bennett, MD   40 mg at 10/14/12 0800  . valACYclovir (VALTREX) tablet 500 mg  500 mg Oral Daily PRN Drema Dallas, MD        Allergies  Allergen Reactions  . Benazepril Hcl     REACTION: cough  . Ciprofloxacin     REACTION: Rhabdomyolysis  . Tramadol     n/v      Review of Systems:   General:  decreased appetite, normal energy, no weight gain, no weight loss, no fever  Cardiac:  no chest pain with exertion, no chest pain at rest, no SOB with  exertion, no resting SOB, no PND, no orthopnea, no palpitations, no arrhythmia, no atrial fibrillation, no LE edema, no dizzy spells, no syncope  Respiratory:  no shortness of breath, no home oxygen, no productive cough, + dry cough, no bronchitis, no wheezing, no hemoptysis, no asthma, no pain with inspiration or cough, no sleep apnea, no CPAP at night  GI:   no difficulty swallowing, no reflux, no frequent heartburn, no hiatal hernia, no abdominal pain, no constipation, no diarrhea, no hematochezia, no hematemesis, no melena  GU:   no dysuria,  no frequency, no urinary tract infection, no hematuria, no  kidney stones, no kidney disease  Vascular:  no pain suggestive of claudication, no pain in feet, no leg cramps, no varicose veins, no DVT, no non-healing foot ulcer  Neuro:   no stroke, no TIA's, no seizures, no headaches, no temporary blindness one eye,  no slurred speech, no peripheral neuropathy, + chronic pain, no instability of gait, no memory/cognitive dysfunction  Musculoskeletal: + arthritis, + joint swelling, no myalgias, no difficulty walking, normal mobility   Skin:   + rash, + itching, no skin infections but multiple skin lesions both hands, no pressure sores or ulcerations  Psych:   no  anxiety, no depression, no nervousness, no unusual recent stress  Eyes:   no blurry vision, no floaters, no recent vision changes, no wears glasses or contacts  ENT:   no hearing loss, no loose or painful teeth, no dentures, + persistent hoarseness of voice  Hematologic:  no easy bruising, no abnormal bleeding, no clotting disorder, no frequent epistaxis  Endocrine:  no diabetes, does not check CBG's at home     Physical Exam:   BP 134/95  Pulse 102  Temp(Src) 97.9 F (36.6 C) (Oral)  Resp 18  Ht 5\' 5"  (1.651 m)  Wt 64.8 kg (142 lb 13.7 oz)  BMI 23.77 kg/m2  SpO2 99%  General:    well-appearing  HEENT:  Unremarkable   Neck:   no JVD, no bruits, no adenopathy   Chest:   Few mild bibasilar inspiratory crackles, slightly diminished breath sounds at bases R>L, no wheezes, no rhonchi   CV:   RRR, no murmur   Abdomen:  soft, non-tender, no masses   Extremities:  warm, well-perfused, pulses palpable  Rectal/GU  Deferred  Neuro:   Grossly non-focal and symmetrical throughout  Skin:   Multiple non-erythematous skin lesions bilateral palms and fingertips  Diagnostic Tests:  CTA Chest:                  Study Result    CLINICAL DATA:  Right chest pain.   EXAM: CT ANGIOGRAPHY CHEST WITH CONTRAST   TECHNIQUE: Multidetector CT imaging of the chest was performed using the standard  protocol during bolus administration of intravenous contrast. Multiplanar CT image reconstructions including MIPs were obtained to evaluate the vascular anatomy.   CONTRAST:  OMNIPAQUE IOHEXOL 350 MG/ML SOLN   COMPARISON:  10/11/2012   FINDINGS: Insert skip embolus no aortic dissection observed.   Moderate right pleural effusion noted with scattered peripheral and subpleural densities and scattered irregular nodularity in both lungs. A 1.3 x 1.7 cm nodule in the left lower lobe on image 45 of series 6 appears to be cavitary with a thick wall.   Bilateral axillary lymph nodes noted, short axis diameter up to 1.5 cm. Small pericardial effusion. Scattered hypodense lesions in the liver, some of which appear irregular but sharply defined, and potentially with internal septations.   No definite thickening of the airways although some of the nodularity is superior bronchovascular. A sclerotic 1 cm lesion with central lucency is present in the right 2nd rib laterally on image 21 of series 6, and is technically nonspecific.   Review of the MIP images confirms the above findings.   IMPRESSION: 1. Peripheral irregularity in nodules primarily along the subpleural regions, with some associated interstitial accentuation and with some pericolonic bronchovascular nodules. These nodules are irregular, and one of the nodules appears to be cavitary. Differential diagnostic considerations include sarcoidosis, septic emboli, cryptogenic organizing pneumonia, Churg-Strauss syndrome, chronic eosinophilic pneumonia, tuberculosis, and Wegener's granulomatosis. 2. Moderate right pleural effusion. Small pericardial effusion. 3. Scattered the somewhat irregular hypodense lesions in the liver may be septated and early partially characterized on today's exam. 4. No pulmonary embolus observed. 5. Small sclerotic lesion with central lucency in the right 2nd rib laterally, likely incidental.     Electronically Signed: By: Herbie Baltimore On: 10/11/2012 12:25     ADDENDUM REPORT: 10/11/2012 12:33   ADDENDUM: The original report was by Dr. Gaylyn Rong. The following addendum is by Dr. Gaylyn Rong:   The 1st paragraph under FINDINGS should read:   No filling  defect is identified in the pulmonary arterial tree to suggest pulmonary embolus. No aortic dissection observed.     Electronically Signed   By: Herbie Baltimore   On: 10/11/2012 12:33        Impression:  Scattered bilateral subpleural pulmonary nodules of unclear radiology in this patient with significant polyarthritis and vasculitis, presumably related to rheumatoid arthritis.  In the absence of evidence for the presence of tuberculosis I agree that it would be reasonable to proceed with video-assisted thoracoscopy for lung biopsy for diagnosis.  Transbronchial or transthoracic needle aspiration biopsy would not likely yield adequate tissue, and diagnostic yield even with open lung biopsy may be less than 100%. Risks associated with surgery should be low although there will be the potential for postoperative air leak and/or chest wall pain which could be problematic.   Plan:  I've discussed matters at length with the patient here in the hospital this afternoon. Until results of her tuberculosis test have been finalized we will not make any final plans. We could potentially proceed with video-assisted thoracoscopy for lung biopsy sometime later this week. If the patient is to be discharged from the hospital during the interim we will make plans for readmission for surgery at that time.    Salvatore Decent. [name], MD 10/14/2012 12:12 PM  I spent in excess of 45 minutes of time directly involved in the conduct of this consultation.

## 2012-10-15 DIAGNOSIS — R918 Other nonspecific abnormal finding of lung field: Principal | ICD-10-CM

## 2012-10-15 LAB — MPO/PR-3 (ANCA) ANTIBODIES
Myeloperoxidase Abs: <1 AU/mL (ref <20)
Serine Protease 3: 1 AU/mL (ref <20)

## 2012-10-15 LAB — ANCA SCREEN W REFLEX TITER
c-ANCA Screen: NEGATIVE
p-ANCA Screen: NEGATIVE

## 2012-10-15 NOTE — Progress Notes (Signed)
[facility] PROGRESS NOTE  Jamie [name] NWG:[id number] DOB: 11-26-1965 DOA: 10/11/2012 PCP: Sonda Primes, MD  Assessment/Plan:  Suspected acute vasculitis flare Patient recently diagnosed with thrombotic vasculitis. Had been on 10 mg daily of prednisone. She presented initially overnight with complaints of rib pain, CT scan showing multiple nodules. Pulmonary medicine consulted. Patient now on oral prednisone 40 mg by mouth daily.  Pulmonary nodules I discussed case with Dr. Maple Hudson over weekend and with Dr Delton Coombes today. Also consulted was Dr. [name] of cardiothoracic surgery for consideration of video-assisted thoracoscopy for lung biopsy. Quantiferon TB was ordered 2 days ago and results are still pending. Dr Delton Coombes recommending FOB as a positive BAL could safe patient from more invasive procedures.    Pleural effusion Initial imaging studies showing evidence of moderate sized right sided pleural effusion. Could represent pulmonary manifestations of rheumatoid arthritis. Patient is in no acute respiratory distress.  Hypertension Continue amlodipine and irbesartan.  Tachycardia The patient's heart rates between 70s to low 100s. She has been afebrile, denied pain symptoms. Could be related to underlying lung issue. EKG showed sinus tach, thyroid function tests not revealing hyperthyroidism.   DVT prophylaxis: Enoxaparin subcutaneous    Code Status: Full Code Family Communication: I discussed plan with patient and husband present at bedside Disposition Plan: Continue IV steroids, we'll hold off on antibiotic therapy for now. Followup on rheumatologic workup in rheumatology recommendations    Consultants:  Pulmonary critical care   Antibiotics:  Antimicrobial therapy been held  HPI/Subjective: Patient reports feeling well, has no new complaints. She is tolerating by mouth intake, having regular bowel movements, has been afebrile and hemodynamically stable at this point  in.  Objective: Filed Vitals:   10/15/12 0602  BP: 116/83  Pulse: 92  Temp: 98.1 F (36.7 C)  Resp: 19    Intake/Output Summary (Last 24 hours) at 10/15/12 1458 Last data filed at 10/15/12 1355  Gross per 24 hour  Intake    720 ml  Output   1325 ml  Net   -605 ml   Filed Weights   10/13/12 0547 10/14/12 0522 10/15/12 0602  Weight: 65.227 kg (143 lb 12.8 oz) 64.8 kg (142 lb 13.7 oz) 64.2 kg (141 lb 8.6 oz)    Exam:   General:  Patient is in no acute distress she denies any complaints this morning. Tolerating by mouth intake.  Cardiovascular: Regular rate and rhythm normal S1-S2 no murmurs rubs  Respiratory: Positive crackles to left side otherwise normal respiratory effort, no wheezing or rhonchi, although supplemental oxygen  Abdomen: Abdomen is soft nontender nontender positive bowel sounds in upper quad  Musculoskeletal: Present range of motion of all extremities  Skin: Patient having erythematous lesions over bilateral fingertips as well as palms with a few lesions also in her right foot  Data Reviewed: Basic Metabolic Panel:  Recent Labs Lab 10/11/12 0940 10/12/12 0500 10/14/12 0410  NA 140 134* 139  K 3.2* 3.7 3.6  CL 104 100 104  CO2  --  21 26  GLUCOSE 82 109* 86  BUN 17 11 21   CREATININE 0.90 0.53 0.63  CALCIUM  --  8.6 8.9  MG  --  1.6  --    Liver Function Tests:  Recent Labs Lab 10/12/12 0500  AST 40*  ALT 25  ALKPHOS 59  BILITOT 0.4  PROT 6.7  ALBUMIN 2.7*   No results found for this basename: LIPASE, AMYLASE,  in the last 168 hours No results found for this  basename: AMMONIA,  in the last 168 hours CBC:  Recent Labs Lab 10/11/12 0912 10/11/12 0940 10/12/12 0500 10/13/12 0855 10/14/12 0410  WBC 6.8  --  5.7 12.9* 10.1  NEUTROABS 6.0  --  5.1  --   --   HGB 12.9 12.9 13.1 14.0 12.5  HCT 36.6 38.0 36.9 38.6 36.4  MCV 92.9  --  91.3 91.3 93.3  PLT 175  --  177 228 187   Cardiac Enzymes: No results found for this  basename: CKTOTAL, CKMB, CKMBINDEX, TROPONINI,  in the last 168 hours BNP (last 3 results) No results found for this basename: PROBNP,  in the last 8760 hours CBG: No results found for this basename: GLUCAP,  in the last 168 hours  No results found for this or any previous visit (from the past 240 hour(s)).   Studies: No results found.  Scheduled Meds: . irbesartan  300 mg Oral Daily   And  . amLODipine  10 mg Oral Daily   And  . hydrochlorothiazide  25 mg Oral Daily  . enoxaparin (LOVENOX) injection  40 mg Subcutaneous Q24H  . predniSONE  40 mg Oral Q breakfast   Continuous Infusions:   Principal Problem:   Rheumatoid vasculitis Active Problems:   THYROID NODULE   HYPERTENSION   Arthralgia   Rash and nonspecific skin eruption   Pleural effusion   Pulmonary nodules/lesions, multiple    Time spent: 35 minutes    Jeralyn Bennett  [facility] Pager [phone]. If 7PM-7AM, please contact night-coverage at [email or web address], password [facility] 10/15/2012, 2:58 PM  LOS: 4 days

## 2012-10-15 NOTE — Progress Notes (Signed)
The patient was given Motrin this morning for arthritic pain.  The patient did not have any acute changes overnight and did not have any other complaints.

## 2012-10-15 NOTE — Progress Notes (Signed)
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 Seen by Dr [name] in Feb 2014 for pulm infiltrates that cleared w/steroids and abx. Admitted on 8/29 w/ CC: right rib pain. CT chest to r/o PE showed diffuse nodular airspace disease w/small right effusion & PCCM asked to assess.   SIGNIFICANT EVENTS / STUDIES:  8/29 CT chest>>> subpleural and bronchovascular nodules, small right effusion and a small LLL nodule which appeared to be cavitary.  Neg for PE.   LINES / TUBES:  CULTURES:  ANTIBIOTICS:  SUBJECTIVE:  Comfortable   VITAL SIGNS: Temp:  [98.1 F (36.7 C)-98.7 F (37.1 C)] 98.1 F (36.7 C) (09/02 0602) Pulse Rate:  [92-107] 92 (09/02 0602) Resp:  [18-20] 19 (09/02 0602) BP: (116-140)/(76-89) 116/83 mmHg (09/02 0602) SpO2:  [98 %-100 %] 100 % (09/02 0602) Weight:  [64.2 kg (141 lb 8.6 oz)] 64.2 kg (141 lb 8.6 oz) (09/02 0602)  PHYSICAL EXAMINATION: General:  wdwn adult female in NAD Neuro:  AAOx4, s[peech clear, MAE HEENT:  Mm pink/moist, no jvd, mild oral thrush Cardiovascular:  s1s2 rrr, no m/r/g, tachy Lungs:  resp's even/non-labored, lungs bilaterally with faint fine crackles posterior. Minimal dullness R base Abdomen:  Round/soft, bsx4 active Musculoskeletal:  No acute deformties.  L>R swelling, ambulatory Skin:  Generalized skin eruptions, small round that appear like healing wounds.  No exudate or warmth   Recent Labs Lab 10/11/12 0940 10/12/12 0500 10/14/12 0410  NA 140 134* 139  K 3.2* 3.7 3.6  CL 104 100 104  CO2  --  21 26  BUN 17 11 21   CREATININE 0.90 0.53 0.63  GLUCOSE 82 109* 86    Recent Labs Lab 10/12/12 0500 10/13/12 0855 10/14/12 0410  HGB 13.1 14.0 12.5  HCT  36.9 38.6 36.4  WBC 5.7 12.9* 10.1  PLT 177 228 187   No results found.   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  Complete workup not available in EPIC.  Presents with new R sided pleuritic pain and continued skin eruptions.  Maintenance dose of prednisone is 10 mg QD.  She is planned to see Rheumatology at [facility] the first week in September.  Presumed vasculitis acute flare with pulmonary presentation.  However, differential includes sarcoidosis vs lupus vs vasculitis vs opportunistic infxn.    PLAN: -IV dose steroids, would avoid high dose until dx known: avoid harm immune suppression, may in fact need higher dosing of steroids, follow clinical course -may in fact need escalation of other immune suppressant meds (cytoxan etc) -assess autoimmune panel, ANCA and MPO still pending -Hep C negative as outpatient per pt -Dr. Jon Billings to follow as an outpt -agree no abx for now -agree w [name] that TBBx would probably be unhelpful in dx vasculitis due to lack of architecture on path.  TBBx could help dx sarcoid but this dx is unlikely. I do believe FOB may benefit her in that a positive BAL could same her a surgery and general anesthesia. Will arrange this admission so the data will be available before surgery decision made.    Levy Pupa, MD,  PhD 10/15/2012, 12:10 PM [facility] [phone] or if no answer [phone]

## 2012-10-15 NOTE — Progress Notes (Signed)
[facility] BRIEF PROGRESS NOTE   Await results of Quantiferon Tb test and BAL.  If both are non-diagnostic we will plan VATS for lung biopsy on Thursday.  [name],CLARENCE H 10/15/2012 3:52 PM

## 2012-10-16 DIAGNOSIS — M069 Rheumatoid arthritis, unspecified: Secondary | ICD-10-CM

## 2012-10-16 DIAGNOSIS — D381 Neoplasm of uncertain behavior of trachea, bronchus and lung: Secondary | ICD-10-CM

## 2012-10-16 LAB — QUANTIFERON TB GOLD ASSAY (BLOOD)
Mitogen value: 0.02 IU/mL
Quantiferon Nil Value: 0.01 IU/mL

## 2012-10-16 LAB — CBC
HCT: 37.4 % (ref 36.0–46.0)
Hemoglobin: 13.4 g/dL (ref 12.0–15.0)
MCH: 32.7 pg (ref 26.0–34.0)
MCHC: 35.8 g/dL (ref 30.0–36.0)
RBC: 4.1 MIL/uL (ref 3.87–5.11)

## 2012-10-16 LAB — BASIC METABOLIC PANEL
BUN: 23 mg/dL (ref 6–23)
Chloride: 105 mEq/L (ref 96–112)
GFR calc non Af Amer: >90 mL/min (ref >90)
Glucose, Bld: 80 mg/dL (ref 70–99)
Potassium: 3.5 mEq/L (ref 3.5–5.1)
Sodium: 143 mEq/L (ref 135–145)

## 2012-10-16 LAB — ANTI-DNA ANTIBODY, DOUBLE-STRANDED: ds DNA Ab: 5 IU/mL (ref <30)

## 2012-10-16 LAB — RHEUMATOID FACTOR: Rhuematoid fact SerPl-aCnc: <10 IU/mL (ref <=14)

## 2012-10-16 MED ORDER — LACTATED RINGERS IV SOLN: INTRAVENOUS | Status: DC

## 2012-10-16 MED ORDER — MIDAZOLAM HCL 2 MG/2ML IJ SOLN: 1.0000 mg | INTRAMUSCULAR | Status: DC | PRN

## 2012-10-16 MED ORDER — FENTANYL CITRATE 0.05 MG/ML IJ SOLN: 50.0000 ug | INTRAMUSCULAR | Status: DC | PRN

## 2012-10-16 NOTE — Progress Notes (Signed)
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 Patient now on oral prednisone 40 mg by mouth daily. 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 - Quantiferon TB is still pending.  - On IV steroids. Hep C negative. ESR high, TSH WNL - ANCA and MPO still pending. Check Rheumatoid factor, RA can cause pulmonary nodules. - Dr Delton Coombes recommending FOB as a positive BAL could safe patient from more invasive procedures.   Pleural effusion: - pt in no acute respiratory distress.  Arthralgia - Morning stiffness with joint pain, she relates they are improved since she has been on new dose of steroids.  HYPERTENSION: - stable, cont meds.    Code Status: Full Code  Family Communication: I discussed plan with patient and husband present at bedside  Disposition Plan: Continue IV steroids, we'll hold off on antibiotic therapy for now. Followup on rheumatologic workup in rheumatology recommendations    Consultants:  Pulmonary  Procedures:  CT chest  Antibiotics:  None  HPI/Subjective: No complains.  Objective: Filed Vitals:   10/14/12 1955 10/15/12 0602 10/15/12 1516 10/16/12 0629  BP: 117/89 116/83 119/88 111/72  Pulse: 107 92 111 98  Temp: 98.7 F (37.1 C) 98.1 F (36.7 C) 97.9 F (36.6 C) 97.8 F (36.6 C)  TempSrc: Oral Oral Oral Oral  Resp: 18 19 18 20   Height:      Weight:  64.2 kg (141 lb 8.6 oz)  64.003 kg (141 lb 1.6 oz)  SpO2: 100% 100% 100% 100%    Intake/Output Summary (Last 24 hours) at 10/16/12 1140 Last data filed at 10/16/12 0611  Gross per 24 hour  Intake    480 ml  Output    850 ml  Net   -370 ml   Filed Weights   10/14/12 0522 10/15/12 0602 10/16/12 0629  Weight: 64.8 kg (142 lb 13.7 oz) 64.2 kg (141 lb 8.6 oz) 64.003 kg (141 lb 1.6 oz)    Exam:  General: Alert, awake, oriented x3, in no  acute distress.  HEENT: No bruits, no goiter.  Heart: Regular rate and rhythm, without murmurs, rubs, gallops.  Lungs: Good air movement, bilateral air movement.  Abdomen: Soft, nontender, nondistended, positive bowel sounds.    Data Reviewed: Basic Metabolic Panel:  Recent Labs Lab 10/11/12 0940 10/12/12 0500 10/14/12 0410 10/16/12 0520  NA 140 134* 139 143  K 3.2* 3.7 3.6 3.5  CL 104 100 104 105  CO2  --  21 26 26   GLUCOSE 82 109* 86 80  BUN 17 11 21 23   CREATININE 0.90 0.53 0.63 0.66  CALCIUM  --  8.6 8.9 9.0  MG  --  1.6  --   --    Liver Function Tests:  Recent Labs Lab 10/12/12 0500  AST 40*  ALT 25  ALKPHOS 59  BILITOT 0.4  PROT 6.7  ALBUMIN 2.7*   No results found for this basename: LIPASE, AMYLASE,  in the last 168 hours No results found for this basename: AMMONIA,  in the last 168 hours CBC:  Recent Labs Lab 10/11/12 0912 10/11/12 0940 10/12/12 0500 10/13/12 0855 10/14/12 0410 10/16/12 0520  WBC 6.8  --  5.7 12.9* 10.1 6.7  NEUTROABS 6.0  --  5.1  --   --   --   HGB 12.9 12.9 13.1 14.0 12.5 13.4  HCT 36.6 38.0 36.9 38.6 36.4 37.4  MCV 92.9  --  91.3 91.3 93.3 91.2  PLT 175  --  177 228 187 196   Cardiac Enzymes: No results found for this basename: CKTOTAL, CKMB, CKMBINDEX, TROPONINI,  in the last 168 hours BNP (last 3 results) No results found for this basename: PROBNP,  in the last 8760 hours CBG: No results found for this basename: GLUCAP,  in the last 168 hours  No results found for this or any previous visit (from the past 240 hour(s)).   Studies: No results found.  Scheduled Meds: . irbesartan  300 mg Oral Daily   And  . amLODipine  10 mg Oral Daily   And  . hydrochlorothiazide  25 mg Oral Daily  . enoxaparin (LOVENOX) injection  40 mg Subcutaneous Q24H  . predniSONE  40 mg Oral Q breakfast   Continuous Infusions:    Marinda Elk  [facility] Pager [phone]. If 8PM-8AM, please contact night-coverage at  [email or web address], password [facility] 10/16/2012, 11:40 AM  LOS: 5 days

## 2012-10-16 NOTE — Progress Notes (Signed)
The patient did not have any complaints this morning and did not have any acute changes overnight. 

## 2012-10-16 NOTE — Progress Notes (Signed)
      [address]       [place] [address]             [phone]     CARDIOTHORACIC SURGERY PROGRESS NOTE  Subjective: No complaints  Objective: Vital signs in last 24 hours: Temp:  [97.8 F (36.6 C)-98.2 F (36.8 C)] 98.2 F (36.8 C) (09/03 1300) Pulse Rate:  [98-103] 103 (09/03 1300) Cardiac Rhythm:  [-] Normal sinus rhythm (09/03 0757) Resp:  [20] 20 (09/03 1300) BP: (111)/(72) 111/72 mmHg (09/03 1300) SpO2:  [99 %-100 %] 99 % (09/03 1300) Weight:  [64.003 kg (141 lb 1.6 oz)] 64.003 kg (141 lb 1.6 oz) (09/03 0629)  Physical Exam:    Breath sounds: clear  Heart sounds:  RRR  Incisions:  n/a  Abdomen:  soft  Extremities:  warm   Intake/Output from previous day: 09/02 0701 - 09/03 0700 In: 720 [P.O.:720] Out: 850 [Urine:850] Intake/Output this shift: Total I/O In: 240 [P.O.:240] Out: 300 [Urine:300]  Lab Results:  Recent Labs  10/14/12 0410 10/16/12 0520  WBC 10.1 6.7  HGB 12.5 13.4  HCT 36.4 37.4  PLT 187 196   BMET:  Recent Labs  10/14/12 0410 10/16/12 0520  NA 139 143  K 3.6 3.5  CL 104 105  CO2 26 26  GLUCOSE 86 80  BUN 21 23  CREATININE 0.63 0.66  CALCIUM 8.9 9.0    CBG (last 3)  No results found for this basename: GLUCAP,  in the last 72 hours PT/INR:   Recent Labs  10/14/12 1444  LABPROT 12.1  INR 0.91    CXR:  N/A  Assessment/Plan:  Quantiferon Tb test still pending.  Options discussed with patient and Dr Delton Coombes.  We plan to proceed with bronchoscopy with BAL and left VATS for lung biopsy on Friday 9/5.  The patient understands and accepts all potential associated risks of surgery including but not limited to risk of death, stroke, myocardial infarction, congestive heart failure, respiratory failure, renal failure, bleeding requiring blood transfusion and/or reexploration, arrhythmia, pneumonia, pleural effusion, wound infection, pulmonary embolus or other thromboembolic complication, prolonged air leak,  chronic pain or other delayed complications.  All questions answered.   Purcell Nails 10/16/2012 4:37 PM

## 2012-10-16 NOTE — Progress Notes (Signed)
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 Seen by Dr [name] in Feb 2014 for pulm infiltrates that cleared w/steroids and abx. Admitted on 8/29 w/ CC: right rib pain. CT chest to r/o PE showed diffuse nodular airspace disease w/small right effusion & PCCM asked to assess.   SIGNIFICANT EVENTS / STUDIES:  8/29 CT chest>>> subpleural and bronchovascular nodules, small right effusion and a small LLL nodule which appeared to be cavitary.  Neg for PE.   LINES / TUBES:  CULTURES:  ANTIBIOTICS:  SUBJECTIVE:  Comfortable   VITAL SIGNS: Temp:  [97.8 F (36.6 C)-97.9 F (36.6 C)] 97.8 F (36.6 C) (09/03 0629) Pulse Rate:  [98-111] 98 (09/03 0629) Resp:  [18-20] 20 (09/03 0629) BP: (111-119)/(72-88) 111/72 mmHg (09/03 0629) SpO2:  [100 %] 100 % (09/03 0629) Weight:  [64.003 kg (141 lb 1.6 oz)] 64.003 kg (141 lb 1.6 oz) (09/03 0629)  PHYSICAL EXAMINATION: General:  wdwn adult female in NAD Neuro:  AAOx4, s[peech clear, MAE HEENT:  Mm pink/moist, no jvd, mild oral thrush Cardiovascular:  s1s2 rrr, no m/r/g, tachy Lungs:  resp's even/non-labored, lungs bilaterally with faint fine crackles posterior. Minimal dullness R base Abdomen:  Round/soft, bsx4 active Musculoskeletal:  No acute deformties.  L>R swelling, ambulatory Skin:  Generalized skin eruptions, small round that appear like healing wounds.  No exudate or warmth   Recent Labs Lab 10/12/12 0500 10/14/12 0410 10/16/12 0520  NA 134* 139 143  K 3.7 3.6 3.5  CL 100 104 105  CO2 21 26 26   BUN 11 21 23   CREATININE 0.53 0.63 0.66  GLUCOSE 109* 86 80    Recent Labs Lab 10/13/12 0855 10/14/12 0410 10/16/12 0520  HGB 14.0 12.5 13.4  HCT  38.6 36.4 37.4  WBC 12.9* 10.1 6.7  PLT 228 187 196   No results found.   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  Complete workup not available in EPIC.  Presents with new R sided pleuritic pain and continued skin eruptions.  Maintenance dose of prednisone is 10 mg QD.  She is planned to see Rheumatology at [facility] the first week in September.  Presumed vasculitis acute flare with pulmonary presentation.  However, differential includes sarcoidosis vs lupus vs vasculitis vs opportunistic infxn.  ANCA negative, MPO negative.   PLAN: -IV dose steroids, would avoid high dose until dx known: avoid harm immune suppression, may in fact need higher dosing of steroids, follow clinical course -may in fact need escalation of other immune suppressant meds (cytoxan etc) -Hep C negative as outpatient per pt -Dr. Jon Billings to follow as an outpt -agree no abx for now -agree w [name] that TBBx would probably be unhelpful in dx vasculitis due to lack of architecture on path. Discussed the yield of FOB (to eval for infxn) vs the yield for going straight to VATS. Given her great functional status I think she should go straight to VATS > she can get path and micro data from this. She agrees w this. Will let [name] know.    Levy Pupa, MD, PhD 10/16/2012, 12:21  PM [facility] [phone] or if no answer [phone]

## 2012-10-16 NOTE — Progress Notes (Signed)
Utilization Review Completed.   Melisssa Donner, RN, BSN Nurse Case Manager  [phone]  

## 2012-10-17 ENCOUNTER — Encounter (HOSPITAL_COMMUNITY): Payer: 59

## 2012-10-17 ENCOUNTER — Encounter (HOSPITAL_COMMUNITY)
Admission: EM | Disposition: A | Discharge status: Home / Self Care | Payer: Self-pay | Source: Home / Self Care | Attending: Internal Medicine

## 2012-10-17 LAB — COMPREHENSIVE METABOLIC PANEL
ALT: 46 U/L — ABNORMAL HIGH (ref 0–35)
AST: 47 U/L — ABNORMAL HIGH (ref 0–37)
Calcium: 9.1 mg/dL (ref 8.4–10.5)
Creatinine, Ser: 0.58 mg/dL (ref 0.50–1.10)
GFR calc Af Amer: >90 mL/min (ref >90)
Sodium: 136 mEq/L (ref 135–145)
Total Protein: 6.9 g/dL (ref 6.0–8.3)

## 2012-10-17 LAB — CBC
MCH: 31.9 pg (ref 26.0–34.0)
MCHC: 34.6 g/dL (ref 30.0–36.0)
MCV: 92.1 fL (ref 78.0–100.0)
Platelets: 194 10*3/uL (ref 150–400)
RDW: 13.6 % (ref 11.5–15.5)

## 2012-10-17 LAB — SURGICAL PCR SCREEN
MRSA, PCR: NEGATIVE
Staphylococcus aureus: NEGATIVE

## 2012-10-17 LAB — APTT: aPTT: 28 seconds (ref 24–37)

## 2012-10-17 LAB — PROTIME-INR: INR: 0.88 (ref 0.00–1.49)

## 2012-10-17 SURGERY — VIDEO BRONCHOSCOPY WITHOUT FLUORO
Anesthesia: Moderate Sedation | Laterality: Bilateral

## 2012-10-17 MED ORDER — DEXTROSE 5 % IV SOLN
1.5000 g | INTRAVENOUS | Status: AC
  Administered 2012-10-18: 1.5 g via INTRAVENOUS
  Filled 2012-10-17: qty 1.5

## 2012-10-17 NOTE — Progress Notes (Signed)
PCCM Brief Note  Discussed case with Drs [name] and David Stall. Agree that VATS bx is the best next step to assess her nodules. Her quant gold is still pending, but clinical suspicion for TB is low. I cancelled her FOB scheduled for this am. The VATS is posted for 9/5. We will follow with you after surgery.   Levy Pupa, MD, PhD 10/17/2012, 12:16 PM [facility] [phone] or if no answer [phone]

## 2012-10-17 NOTE — Progress Notes (Signed)
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 Patient now on oral prednisone 40 mg by mouth daily.  - Vats on 9.5.2014. - Quantiferon TB is still pending.  - ANCA and MPO still pending. Check Rheumatoid factor, RA can cause pulmonary nodules.    Pleural effusion: - pt in no acute respiratory distress.  Arthralgia - Morning stiffness with joint pain, she relates they are improved since she has been on new dose of steroids.  HYPERTENSION: - stable, cont meds.    Code Status: Full Code  Family Communication: I discussed plan with patient and husband present at bedside  Disposition Plan: Continue IV steroids, we'll hold off on antibiotic therapy for now. Followup on rheumatologic workup in rheumatology recommendations    Consultants:  Pulmonary  Procedures:  CT chest  Antibiotics:  None  HPI/Subjective: No complains.  Objective: Filed Vitals:   10/16/12 1300 10/16/12 2044 10/17/12 0438 10/17/12 0953  BP: 111/72 113/71 114/86 115/78  Pulse: 103 109 100 103  Temp: 98.2 F (36.8 C) 97.8 F (36.6 C) 98 F (36.7 C)   TempSrc: Oral Oral Oral   Resp: 20 20 20    Height:      Weight:   64 kg (141 lb 1.5 oz)   SpO2: 99% 100% 99%     Intake/Output Summary (Last 24 hours) at 10/17/12 1028 Last data filed at 10/17/12 0600  Gross per 24 hour  Intake    600 ml  Output    300 ml  Net    300 ml   Filed Weights   10/15/12 0602 10/16/12 0629 10/17/12 0438  Weight: 64.2 kg (141 lb 8.6 oz) 64.003 kg (141 lb 1.6 oz) 64 kg (141 lb 1.5 oz)    Exam:  General: Alert, awake, oriented x3, in no acute distress.  HEENT: No bruits, no goiter.  Heart: Regular rate and rhythm, without murmurs, rubs, gallops.  Lungs: Good air movement, bilateral air movement.  Abdomen: Soft, nontender, nondistended, positive bowel sounds.    Data Reviewed: Basic Metabolic  Panel:  Recent Labs Lab 10/11/12 0940 10/12/12 0500 10/14/12 0410 10/16/12 0520  NA 140 134* 139 143  K 3.2* 3.7 3.6 3.5  CL 104 100 104 105  CO2  --  21 26 26   GLUCOSE 82 109* 86 80  BUN 17 11 21 23   CREATININE 0.90 0.53 0.63 0.66  CALCIUM  --  8.6 8.9 9.0  MG  --  1.6  --   --    Liver Function Tests:  Recent Labs Lab 10/12/12 0500  AST 40*  ALT 25  ALKPHOS 59  BILITOT 0.4  PROT 6.7  ALBUMIN 2.7*   No results found for this basename: LIPASE, AMYLASE,  in the last 168 hours No results found for this basename: AMMONIA,  in the last 168 hours CBC:  Recent Labs Lab 10/11/12 0912 10/11/12 0940 10/12/12 0500 10/13/12 0855 10/14/12 0410 10/16/12 0520  WBC 6.8  --  5.7 12.9* 10.1 6.7  NEUTROABS 6.0  --  5.1  --   --   --   HGB 12.9 12.9 13.1 14.0 12.5 13.4  HCT 36.6 38.0 36.9 38.6 36.4 37.4  MCV 92.9  --  91.3 91.3 93.3 91.2  PLT 175  --  177 228 187 196   Cardiac Enzymes: No results found for this basename: CKTOTAL, CKMB, CKMBINDEX, TROPONINI,  in the last 168 hours BNP (last 3 results)  No results found for this basename: PROBNP,  in the last 8760 hours CBG: No results found for this basename: GLUCAP,  in the last 168 hours  No results found for this or any previous visit (from the past 240 hour(s)).   Studies: No results found.  Scheduled Meds: . irbesartan  300 mg Oral Daily   And  . amLODipine  10 mg Oral Daily   And  . hydrochlorothiazide  25 mg Oral Daily  . enoxaparin (LOVENOX) injection  40 mg Subcutaneous Q24H  . predniSONE  40 mg Oral Q breakfast   Continuous Infusions: . lactated ringers       Marinda Elk  [facility] Pager [phone]. If 8PM-8AM, please contact night-coverage at [email or web address], password [facility] 10/17/2012, 10:28 AM  LOS: 6 days

## 2012-10-17 NOTE — Progress Notes (Signed)
[facility] BRIEF PROGRESS NOTE   Results of Tb test negative:  Results for Jamie [name], Jamie [name] (MRN [id number]) as of 10/17/2012 13:01  Ref. Range 10/13/2012 16:50  Interferon Gamma Release Assay Latest Range: NEGATIVE  INDETERM  TB Ag value No range found 0.01  Quantiferon Nil Value No range found 0.01  Mitogen value No range found 0.02  TB Antigen Minus Nil Value No range found 0.00    We plan VATS and bronch w/ BAL in am tomorrow.  [name] understands the indications, risks and potential benefits of surgery.  All questions answered.  [name] H 10/17/2012 1:02 PM

## 2012-10-17 NOTE — Progress Notes (Signed)
Spoke with Dr. David Stall, Dr. Barry Dienes and Efraim Kaufmann from [facility] To discontinue isolation for patient, orders given to discontinue isolation. Pt remains alert and oriented times 4, NAD noted, able to communicate needs, independent in room, will continue to monitor

## 2012-10-17 NOTE — Progress Notes (Signed)
10/17/12 Patient is A/Ox4, no c/o pain and is ambulatory without assistance. Spoke with patient and was told that vats procedure would be done on Friday, also looked in patient's chart and seen physicians note also said Friday. However patient's order was for NPO beginning midnight Thursday 9/4 instead of Friday at midnight. MD on call for Dr.[name] who was Dr. Morton Peters was called and notified. NPO order was changed.

## 2012-10-18 ENCOUNTER — Inpatient Hospital Stay (HOSPITAL_COMMUNITY): Payer: 59 | Admitting: Anesthesiology

## 2012-10-18 ENCOUNTER — Encounter (HOSPITAL_COMMUNITY): Payer: Self-pay | Admitting: Anesthesiology

## 2012-10-18 ENCOUNTER — Encounter: Payer: Self-pay | Admitting: Internal Medicine

## 2012-10-18 ENCOUNTER — Encounter (HOSPITAL_COMMUNITY)
Admission: EM | Disposition: A | Discharge status: Home / Self Care | Payer: Self-pay | Source: Home / Self Care | Attending: Internal Medicine

## 2012-10-18 ENCOUNTER — Inpatient Hospital Stay (HOSPITAL_COMMUNITY): Payer: 59

## 2012-10-18 DIAGNOSIS — D381 Neoplasm of uncertain behavior of trachea, bronchus and lung: Secondary | ICD-10-CM

## 2012-10-18 HISTORY — PX: VIDEO BRONCHOSCOPY: SHX5072

## 2012-10-18 HISTORY — PX: VIDEO ASSISTED THORACOSCOPY: SHX5073

## 2012-10-18 HISTORY — PX: LUNG BIOPSY: SHX5088

## 2012-10-18 LAB — URINALYSIS, ROUTINE W REFLEX MICROSCOPIC
Ketones, ur: NEGATIVE mg/dL
Leukocytes, UA: NEGATIVE
Nitrite: NEGATIVE
Specific Gravity, Urine: 1.027 (ref 1.005–1.030)
Urobilinogen, UA: 1 mg/dL (ref 0.0–1.0)
pH: 6 (ref 5.0–8.0)

## 2012-10-18 LAB — TYPE AND SCREEN
ABO/RH(D): O POS
Antibody Screen: NEGATIVE

## 2012-10-18 SURGERY — BRONCHOSCOPY, VIDEO-ASSISTED
Anesthesia: General | Site: Chest | Laterality: Left | Wound class: Clean

## 2012-10-18 MED ORDER — ENOXAPARIN SODIUM 40 MG/0.4ML ~~LOC~~ SOLN
40.0000 mg | SUBCUTANEOUS | Status: DC
  Filled 2012-10-18 (×3): qty 0.4

## 2012-10-18 MED ORDER — SUFENTANIL CITRATE 50 MCG/ML IV SOLN
INTRAVENOUS | Status: DC | PRN
  Administered 2012-10-18: 10 ug via INTRAVENOUS
  Administered 2012-10-18: 25 ug via INTRAVENOUS
  Administered 2012-10-18 (×3): 5 ug via INTRAVENOUS

## 2012-10-18 MED ORDER — OXYCODONE HCL 5 MG PO TABS: 5.0000 mg | ORAL_TABLET | Freq: Once | ORAL | Status: DC | PRN

## 2012-10-18 MED ORDER — DEXTROSE 5 % IV SOLN
1.5000 g | Freq: Two times a day (BID) | INTRAVENOUS | Status: AC
  Administered 2012-10-18 – 2012-10-19 (×2): 1.5 g via INTRAVENOUS
  Filled 2012-10-18 (×2): qty 1.5

## 2012-10-18 MED ORDER — OXYCODONE-ACETAMINOPHEN 5-325 MG PO TABS
1.0000 | ORAL_TABLET | ORAL | Status: DC | PRN
  Administered 2012-10-19 – 2012-10-20 (×2): 1 via ORAL
  Filled 2012-10-18: qty 2

## 2012-10-18 MED ORDER — PROPOFOL 10 MG/ML IV BOLUS
INTRAVENOUS | Status: DC | PRN
  Administered 2012-10-18: 200 mg via INTRAVENOUS

## 2012-10-18 MED ORDER — LACTATED RINGERS IV SOLN
INTRAVENOUS | Status: DC | PRN
  Administered 2012-10-18: 07:00:00 via INTRAVENOUS

## 2012-10-18 MED ORDER — 0.9 % SODIUM CHLORIDE (POUR BTL) OPTIME
TOPICAL | Status: DC | PRN
  Administered 2012-10-18: 2000 mL

## 2012-10-18 MED ORDER — NEOSTIGMINE METHYLSULFATE 1 MG/ML IJ SOLN
INTRAMUSCULAR | Status: DC | PRN
  Administered 2012-10-18: 3 mg via INTRAVENOUS

## 2012-10-18 MED ORDER — ONDANSETRON HCL 4 MG/2ML IJ SOLN
4.0000 mg | Freq: Four times a day (QID) | INTRAMUSCULAR | Status: DC | PRN
  Administered 2012-10-18 – 2012-10-20 (×5): 4 mg via INTRAVENOUS
  Filled 2012-10-18 (×4): qty 2

## 2012-10-18 MED ORDER — POTASSIUM CHLORIDE 10 MEQ/50ML IV SOLN: 10.0000 meq | Freq: Every day | INTRAVENOUS | Status: DC | PRN

## 2012-10-18 MED ORDER — ONDANSETRON HCL 4 MG/2ML IJ SOLN
INTRAMUSCULAR | Status: AC
  Filled 2012-10-18: qty 2

## 2012-10-18 MED ORDER — ROCURONIUM BROMIDE 100 MG/10ML IV SOLN
INTRAVENOUS | Status: DC | PRN
  Administered 2012-10-18: 50 mg via INTRAVENOUS

## 2012-10-18 MED ORDER — HYDROMORPHONE HCL PF 1 MG/ML IJ SOLN
INTRAMUSCULAR | Status: AC
  Filled 2012-10-18: qty 1

## 2012-10-18 MED ORDER — GLYCOPYRROLATE 0.2 MG/ML IJ SOLN
INTRAMUSCULAR | Status: DC | PRN
  Administered 2012-10-18: 0.2 mg via INTRAVENOUS

## 2012-10-18 MED ORDER — HYDROMORPHONE HCL PF 1 MG/ML IJ SOLN
0.2500 mg | INTRAMUSCULAR | Status: DC | PRN
  Administered 2012-10-18: 0.5 mg via INTRAVENOUS
  Administered 2012-10-18: 11:00:00 via INTRAVENOUS
  Administered 2012-10-18: 0.5 mg via INTRAVENOUS

## 2012-10-18 MED ORDER — HYDROCORTISONE SOD SUCCINATE 100 MG IJ SOLR
INTRAMUSCULAR | Status: DC | PRN
  Administered 2012-10-18: 100 mg via INTRAVENOUS

## 2012-10-18 MED ORDER — ACETAMINOPHEN 160 MG/5ML PO SOLN
1000.0000 mg | Freq: Four times a day (QID) | ORAL | Status: AC
  Administered 2012-10-18: 1000 mg via ORAL
  Filled 2012-10-18 (×4): qty 40

## 2012-10-18 MED ORDER — OXYCODONE HCL 5 MG/5ML PO SOLN: 5.0000 mg | Freq: Once | ORAL | Status: DC | PRN

## 2012-10-18 MED ORDER — BISACODYL 5 MG PO TBEC
10.0000 mg | DELAYED_RELEASE_TABLET | Freq: Every day | ORAL | Status: DC
  Administered 2012-10-19: 10 mg via ORAL
  Filled 2012-10-18: qty 2

## 2012-10-18 MED ORDER — ACETAMINOPHEN 500 MG PO TABS
1000.0000 mg | ORAL_TABLET | Freq: Four times a day (QID) | ORAL | Status: AC
  Administered 2012-10-18: 1000 mg via ORAL
  Filled 2012-10-18 (×4): qty 2

## 2012-10-18 MED ORDER — MIDAZOLAM HCL 5 MG/5ML IJ SOLN
INTRAMUSCULAR | Status: DC | PRN
  Administered 2012-10-18: 2 mg via INTRAVENOUS

## 2012-10-18 MED ORDER — ESMOLOL HCL 10 MG/ML IV SOLN
INTRAVENOUS | Status: DC | PRN
  Administered 2012-10-18: 8 mg via INTRAVENOUS
  Administered 2012-10-18: 12 mg via INTRAVENOUS

## 2012-10-18 MED ORDER — ONDANSETRON HCL 4 MG/2ML IJ SOLN
INTRAMUSCULAR | Status: DC | PRN
  Administered 2012-10-18: 4 mg via INTRAVENOUS

## 2012-10-18 MED ORDER — HYDROMORPHONE HCL PF 1 MG/ML IJ SOLN
INTRAMUSCULAR | Status: AC
  Administered 2012-10-18: 1 mg
  Filled 2012-10-18: qty 1

## 2012-10-18 MED ORDER — OXYCODONE HCL 5 MG PO TABS
ORAL_TABLET | ORAL | Status: AC
  Filled 2012-10-18: qty 2

## 2012-10-18 MED ORDER — OXYCODONE-ACETAMINOPHEN 5-325 MG PO TABS
1.0000 | ORAL_TABLET | ORAL | Status: DC | PRN
  Filled 2012-10-18: qty 1

## 2012-10-18 MED ORDER — KCL IN DEXTROSE-NACL 20-5-0.45 MEQ/L-%-% IV SOLN
INTRAVENOUS | Status: DC
  Administered 2012-10-18 – 2012-10-19 (×2): via INTRAVENOUS
  Filled 2012-10-18 (×4): qty 1000

## 2012-10-18 MED ORDER — KCL IN DEXTROSE-NACL 20-5-0.45 MEQ/L-%-% IV SOLN
INTRAVENOUS | Status: AC
  Filled 2012-10-18: qty 1000

## 2012-10-18 MED ORDER — OXYCODONE HCL 5 MG PO TABS
5.0000 mg | ORAL_TABLET | ORAL | Status: AC | PRN
  Administered 2012-10-18: 10 mg via ORAL
  Administered 2012-10-18 – 2012-10-19 (×2): 5 mg via ORAL
  Filled 2012-10-18 (×2): qty 1

## 2012-10-18 SURGICAL SUPPLY — 67 items
ADH SKN CLS APL DERMABOND .7 (GAUZE/BANDAGES/DRESSINGS) ×1
BAG DECANTER FOR FLEXI CONT (MISCELLANEOUS) IMPLANT
CANISTER SUCTION 2500CC (MISCELLANEOUS) ×2 IMPLANT
CATH KIT ON Q 5IN SLV (PAIN MANAGEMENT) IMPLANT
CATH THORACIC 28FR (CATHETERS) IMPLANT
CATH THORACIC 28FR RT ANG (CATHETERS) IMPLANT
CATH THORACIC 36FR (CATHETERS) IMPLANT
CATH THORACIC 36FR RT ANG (CATHETERS) IMPLANT
CHERRY SPONGEY 1/2 (GAUZE/BANDAGES/DRESSINGS) IMPLANT
CLOTH BEACON ORANGE TIMEOUT ST (SAFETY) ×2 IMPLANT
CONN ST 1/4X3/8  BEN (MISCELLANEOUS) ×1
CONN ST 1/4X3/8 BEN (MISCELLANEOUS) IMPLANT
CONT SPEC 4OZ CLIKSEAL STRL BL (MISCELLANEOUS) ×4 IMPLANT
COVER SURGICAL LIGHT HANDLE (MISCELLANEOUS) ×4 IMPLANT
DERMABOND ADVANCED (GAUZE/BANDAGES/DRESSINGS) ×1
DERMABOND ADVANCED .7 DNX12 (GAUZE/BANDAGES/DRESSINGS) IMPLANT
DRAIN CHANNEL 28F RND 3/8 FF (WOUND CARE) ×1 IMPLANT
DRAPE LAPAROSCOPIC ABDOMINAL (DRAPES) ×2 IMPLANT
DRAPE WARM FLUID 44X44 (DRAPE) ×4 IMPLANT
ELECT REM PT RETURN 9FT ADLT (ELECTROSURGICAL) ×2
ELECTRODE REM PT RTRN 9FT ADLT (ELECTROSURGICAL) ×1 IMPLANT
GLOVE BIO SURGEON STRL SZ7.5 (GLOVE) ×2 IMPLANT
GLOVE ORTHO TXT STRL SZ7.5 (GLOVE) ×4 IMPLANT
GOWN STRL NON-REIN LRG LVL3 (GOWN DISPOSABLE) ×4 IMPLANT
HEMOSTAT SURGICEL 2X14 (HEMOSTASIS) IMPLANT
KIT BASIN OR (CUSTOM PROCEDURE TRAY) ×2 IMPLANT
KIT ROOM TURNOVER OR (KITS) ×2 IMPLANT
KIT SUCTION CATH 14FR (SUCTIONS) ×2 IMPLANT
NS IRRIG 1000ML POUR BTL (IV SOLUTION) ×4 IMPLANT
PACK CHEST (CUSTOM PROCEDURE TRAY) ×2 IMPLANT
PAD ARMBOARD 7.5X6 YLW CONV (MISCELLANEOUS) ×4 IMPLANT
RELOAD EGIA 45 MED/THCK PURPLE (STAPLE) ×2 IMPLANT
RELOAD EGIA 60 MED/THCK PURPLE (STAPLE) ×6 IMPLANT
RELOAD STAPLE 60 MED/THCK ART (STAPLE) IMPLANT
SEALANT SURG COSEAL 4ML (VASCULAR PRODUCTS) IMPLANT
SOLUTION ANTI FOG 6CC (MISCELLANEOUS) ×2 IMPLANT
SPECIMEN JAR LG PLASTIC EMPTY (MISCELLANEOUS) IMPLANT
SPECIMEN JAR MEDIUM (MISCELLANEOUS) ×5 IMPLANT
SPONGE GAUZE 4X4 12PLY (GAUZE/BANDAGES/DRESSINGS) ×2 IMPLANT
STAPLER VISISTAT 35W (STAPLE) IMPLANT
SUT MNCRL AB 4-0 PS2 18 (SUTURE) ×1 IMPLANT
SUT PROLENE 3 0 SH DA (SUTURE) IMPLANT
SUT PROLENE 4 0 RB 1 (SUTURE)
SUT PROLENE 4-0 RB1 .5 CRCL 36 (SUTURE) IMPLANT
SUT SILK  1 MH (SUTURE) ×1
SUT SILK 1 MH (SUTURE) ×1 IMPLANT
SUT SILK 2 0SH CR/8 30 (SUTURE) IMPLANT
SUT SILK 3 0SH CR/8 30 (SUTURE) IMPLANT
SUT VIC AB 2-0 CT1 27 (SUTURE)
SUT VIC AB 2-0 CT1 TAPERPNT 27 (SUTURE) IMPLANT
SUT VIC AB 2-0 CTX 36 (SUTURE) IMPLANT
SUT VIC AB 3-0 MH 27 (SUTURE) IMPLANT
SUT VIC AB 3-0 SH 27 (SUTURE)
SUT VIC AB 3-0 SH 27X BRD (SUTURE) IMPLANT
SUT VIC AB 3-0 SH 8-18 (SUTURE) ×1 IMPLANT
SUT VICRYL 0 UR6 27IN ABS (SUTURE) ×3 IMPLANT
SUT VICRYL 2 TP 1 (SUTURE) IMPLANT
SWAB COLLECTION DEVICE MRSA (MISCELLANEOUS) IMPLANT
SYSTEM SAHARA CHEST DRAIN ATS (WOUND CARE) ×2 IMPLANT
TAPE CLOTH 4X10 WHT NS (GAUZE/BANDAGES/DRESSINGS) ×2 IMPLANT
TAPE CLOTH SURG 4X10 WHT LF (GAUZE/BANDAGES/DRESSINGS) ×1 IMPLANT
TOWEL OR 17X24 6PK STRL BLUE (TOWEL DISPOSABLE) ×2 IMPLANT
TOWEL OR 17X26 10 PK STRL BLUE (TOWEL DISPOSABLE) ×4 IMPLANT
TRAP SPECIMEN MUCOUS 40CC (MISCELLANEOUS) IMPLANT
TRAY FOLEY CATH 14FRSI W/METER (CATHETERS) IMPLANT
TUBE ANAEROBIC SPECIMEN COL (MISCELLANEOUS) IMPLANT
WATER STERILE IRR 1000ML POUR (IV SOLUTION) ×4 IMPLANT

## 2012-10-18 NOTE — Anesthesia Postprocedure Evaluation (Signed)
  Anesthesia Post-op Note  Patient: Jamie [name]  Procedure(s) Performed: Procedure(s): VIDEO BRONCHOSCOPY (Left) VIDEO ASSISTED THORACOSCOPY (Left) LUNG BIOPSY (Left)  Patient Location: PACU  Anesthesia Type:General  Level of Consciousness: awake and alert   Airway and Oxygen Therapy: Patient Spontanous Breathing  Post-op Pain: none  Post-op Assessment: Post-op Vital signs reviewed, Patient's Cardiovascular Status Stable, Respiratory Function Stable, Patent Airway and No signs of Nausea or vomiting  Post-op Vital Signs: Reviewed and stable  Complications: No apparent anesthesia complications

## 2012-10-18 NOTE — Progress Notes (Signed)
Video Bronchoscopy start time 0758.  End time 0803

## 2012-10-18 NOTE — Progress Notes (Addendum)
10/17/12 Patient is A/Ox4 and is ambulatory without assistance. She is on room air and had no c/o pain or sign of distress during the shift. She has been NPO since midnight. U/A was collected and sent this am. Signed consent forms were in patients's chart. Patient husband was at the bedside prior to her leaving for her procedure. OR checklist was done. Patient's belongs were sent home with husband. Stretcher to transport patient to OR arrived at [phone].

## 2012-10-18 NOTE — Anesthesia Procedure Notes (Signed)
Procedure Name: Intubation Date/Time: 10/18/2012 7:52 AM Performed by: Coralee Rud Pre-anesthesia Checklist: Patient identified, Emergency Drugs available, Suction available and Patient being monitored Patient Re-evaluated:Patient Re-evaluated prior to inductionOxygen Delivery Method: Circle system utilized Preoxygenation: Pre-oxygenation with 100% oxygen Intubation Type: IV induction Ventilation: Mask ventilation without difficulty Laryngoscope Size: Miller and 3 Grade View: Grade I Tube type: Oral Endobronchial tube: Left and 39 Fr Number of attempts: 1 Airway Equipment and Method: Stylet and Bite block Placement Confirmation: ETT inserted through vocal cords under direct vision and positive ETCO2 Secured at: 31 cm Tube secured with: Tape Dental Injury: Teeth and Oropharynx as per pre-operative assessment

## 2012-10-18 NOTE — Transfer of Care (Signed)
Immediate Anesthesia Transfer of Care Note  Patient: Jamie [name]  Procedure(s) Performed: Procedure(s): VIDEO BRONCHOSCOPY (Left) VIDEO ASSISTED THORACOSCOPY (Left) LUNG BIOPSY (Left)  Patient Location: PACU  Anesthesia Type:General  Level of Consciousness: awake, sedated and patient cooperative  Airway & Oxygen Therapy: Patient Spontanous Breathing and Patient connected to face mask oxygen  Post-op Assessment: Report given to PACU RN, Post -op Vital signs reviewed and stable and Patient moving all extremities  Post vital signs: Reviewed and stable  Complications: No apparent anesthesia complications

## 2012-10-18 NOTE — Anesthesia Preprocedure Evaluation (Addendum)
Anesthesia Evaluation  Patient identified by MRN, date of birth, ID band Patient awake    Reviewed: Allergy & Precautions, H&P , NPO status , Patient's Chart, lab work & pertinent test results  Airway Mallampati: II TM Distance: >3 FB Neck ROM: Full    Dental no notable dental hx. (+) Teeth Intact and Dental Advisory Given   Pulmonary neg pulmonary ROS,  breath sounds clear to auscultation  Pulmonary exam normal       Cardiovascular hypertension, On Medications Rhythm:Regular Rate:Normal     Neuro/Psych  Headaches, negative psych ROS   GI/Hepatic negative GI ROS, Neg liver ROS,   Endo/Other  negative endocrine ROS  Renal/GU negative Renal ROS  negative genitourinary   Musculoskeletal   Abdominal   Peds  Hematology negative hematology ROS (+)   Anesthesia Other Findings   Reproductive/Obstetrics negative OB ROS                          Anesthesia Physical Anesthesia Plan  ASA: II  Anesthesia Plan: General   Post-op Pain Management:    Induction: Intravenous  Airway Management Planned: Double Lumen EBT  Additional Equipment: Arterial line and CVP  Intra-op Plan:   Post-operative Plan: Extubation in OR and Possible Post-op intubation/ventilation  Informed Consent: I have reviewed the patients History and Physical, chart, labs and discussed the procedure including the risks, benefits and alternatives for the proposed anesthesia with the patient or authorized representative who has indicated his/her understanding and acceptance.   Dental advisory given  Plan Discussed with: CRNA  Anesthesia Plan Comments:         Anesthesia Quick Evaluation

## 2012-10-18 NOTE — Op Note (Signed)
CARDIOTHORACIC SURGERY OPERATIVE NOTE  Date of Procedure:   10/18/2012  Preoperative Diagnosis:  Multiple subpleural lung nodules   Postoperative Diagnosis:  same  Procedure:     Video Bronchoscopy with Bronchoalveolar Lavage  Video-assisted Thoracoscopy for Lung Biopsy   Surgeon:    Salvatore Decent. [name], MD  Assistant:    n/a  Anesthesia:    Zenon Mayo, MD  Operative Findings:                 BRIEF CLINICAL NOTE AND INDICATIONS FOR SURGERY  Patient is a 47 year old African American female from [place] who works in the emergency department here at [employer]. She is a non-smoker, and with exception of hypertension she has been healthy all of her life until this past year. She developed polyarthritis and has been treated by Dr. Kellie Simmering for presumed rheumatoid arthritis. In January of this year she developed new onset of abrupt dry cough and shortness of breath with bibasilar opacities noted on chest x-ray. She was treated with antibiotics and steroids and seen in followup by Dr. [name]. She has since then developed hoarseness of voice, a rash on her hands and fingers, and photosensitivity. 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 Over the past 2 weeks she has developed bilateral chest wall pain that is exacerbated with movement. Ultimately she presented to the emergency department on 10/11/2012. She underwent CT scan angiogram of the chest to rule out pulmonary embolus. There was no sign of pulmonary embolus but bilateral subpleural pulmonary nodules were appreciated with a small right pleural effusion and one nodule on the left side that was felt to have perhaps a small degree of cavitary change. Pulmonary critical care medicine was consulted and the patient was initially seen by Dr. Tyson Alias. The patient was seen in followup by Dr. Maple Hudson who recommended cardiothoracic surgical  consultation for possible lung biopsy.  The patient has been seen in consultation and counseled at length regarding the indications, risks and potential benefits of surgery.  All questions have been answered, and the patient provides full informed consent for the operation as described.       DETAILS OF THE OPERATIVE PROCEDURE  The patient is brought to the operating room on the above mentioned date And placed in the supine position on the operating table. Central venous line and radial arterial line are placed by the anesthesia team. Intravenous antibiotics are administered and pneumatic sequential compression boots are placed on both lower extremities. General endotracheal anesthesia is induced uneventfully.  The patient is initially intubated using a single lumen endotracheal tube.  Flexible fiberoptic bronchoscopy is performed through the patient's existing endotracheal tube. A 5 mm video bronchoscope was passed through the tube and the distal trachea, carina, and left and right endobronchial trees are both visualized. There is normal endobronchial anatomy. There is no ongoing inflammation whatsoever. There are no significant secretions encountered. Endobronchial lavage is performed with saline and endobronchial washings are sent for routine culture, AFB culture, and fungal culture. The bronchoscope was removed uneventfully.  The patient was reintubated using a dual lumen endotracheal tube. The patient is turned to the right lateral decubitus position.  An axillary roll is placed and the patient's left chest was prepared and draped in a sterile manner.  A small incision is made overlying the seventh intercostal space and the anterior axillary line. The incision is completed into the pleural space with electrocautery. The pleural space is palpated  and a 10 mm port was passed through the incision and a thoracoscopic camera advanced through the port. The pleural space is examined visually.  The lung  is completely collapsed. There are no significant adhesions whatsoever. Grossly the lung parenchyma looks normal with no significant obvious abnormalities appreciated.  2 additional port incisions are placed including one located anteriorly overlying the fourth intercostal space and one located posteriorly over the sixth intercostal space. Through these ports the lung was manipulated. No palpable abnormalities are identified including the most dominant subpleural lung nodule noted in the left lung base. A total of 2 separate lung biopsies are obtained using an endoscopic GIA stapling device. A portion of lung apex is removed and the second portion is removed from the apex of the superior segment left lower lobe. Both specimens are sent for both culture and routine histology.  The pleural space is drained using a single 28 French chest tube exited through the original port incision. The remaining incisions are closed in multiple layers in routine fashion. The chest tube was fixed to close suction drainage device.  The patient tolerated the procedure well, was extubated in the operative room, and transported to the postanesthesia care in stable condition. All sponge instrument and needle counts are verified correct. There were no intraoperative complications. Estimated blood loss was trivial.      Salvatore Decent. [name] MD 10/18/2012 9:26 AM

## 2012-10-18 NOTE — Brief Op Note (Signed)
10/11/2012 - 10/18/2012  9:17 AM  PATIENT:  Jamie [name]  47 y.o. female  PRE-OPERATIVE DIAGNOSIS:  Scattered bilateral subpleural pulmonary nodules   POST-OPERATIVE DIAGNOSIS:  * No post-op diagnosis entered *  PROCEDURE:  Procedure(s): VIDEO BRONCHOSCOPY (N/A) VIDEO ASSISTED THORACOSCOPY (Left) LUNG BIOPSY (Left)  SURGEON:  Surgeon(s) and Role:    * Purcell Nails, MD - Primary  PHYSICIAN ASSISTANT:   ASSISTANTS: none   ANESTHESIA:   general  EBL:  Total I/O In: -  Out: 140 [Urine:140]  BLOOD ADMINISTERED:none  DRAINS: 11F Bard Chest Tube(s) in the left pleural space   LOCAL MEDICATIONS USED:  NONE  SPECIMEN:  Biopsy / Limited Resection  DISPOSITION OF SPECIMEN:  PATHOLOGY  COUNTS:  YES  TOURNIQUET:  * No tourniquets in log *  DICTATION: .Note written in EPIC  PLAN OF CARE: Admit to inpatient   PATIENT DISPOSITION:  PACU - hemodynamically stable.   Delay start of Pharmacological VTE agent (>24hrs) due to surgical blood loss or risk of bleeding: no  [name],CLARENCE H 10/18/2012 9:18 AM

## 2012-10-18 NOTE — Preoperative (Signed)
Beta Blockers   Reason not to administer Beta Blockers:Not Applicable 

## 2012-10-19 ENCOUNTER — Inpatient Hospital Stay (HOSPITAL_COMMUNITY): Payer: 59

## 2012-10-19 LAB — BASIC METABOLIC PANEL
BUN: 12 mg/dL (ref 6–23)
Calcium: 8.5 mg/dL (ref 8.4–10.5)
Creatinine, Ser: 0.58 mg/dL (ref 0.50–1.10)
GFR calc non Af Amer: >90 mL/min (ref >90)
Glucose, Bld: 87 mg/dL (ref 70–99)
Potassium: 3.4 mEq/L — ABNORMAL LOW (ref 3.5–5.1)

## 2012-10-19 LAB — CBC
HCT: 35.9 % — ABNORMAL LOW (ref 36.0–46.0)
Hemoglobin: 12.7 g/dL (ref 12.0–15.0)
MCH: 32.5 pg (ref 26.0–34.0)
MCHC: 35.4 g/dL (ref 30.0–36.0)
MCV: 91.8 fL (ref 78.0–100.0)

## 2012-10-19 MED ORDER — POTASSIUM CHLORIDE CRYS ER 20 MEQ PO TBCR
40.0000 meq | EXTENDED_RELEASE_TABLET | Freq: Once | ORAL | Status: AC
  Administered 2012-10-19: 40 meq via ORAL
  Filled 2012-10-19: qty 2

## 2012-10-19 MED ORDER — SODIUM CHLORIDE 0.9 % IV SOLN
INTRAVENOUS | Status: DC
  Administered 2012-10-19: 20 mL via INTRAVENOUS

## 2012-10-19 NOTE — Progress Notes (Addendum)
[facility] DAILY ICU PROGRESS NOTE                   [address]            [place] [address]          [phone]   1 Day Post-Op Procedure(s) (LRB): VIDEO BRONCHOSCOPY (Left) VIDEO ASSISTED THORACOSCOPY (Left) LUNG BIOPSY (Left)  Total Length of Stay:  LOS: 8 days   Subjective: Looks and feels ok  Objective: Vital signs in last 24 hours: Temp:  [97.3 F (36.3 C)-98.2 F (36.8 C)] 98.1 F (36.7 C) (09/06 0700) Pulse Rate:  [9-111] 96 (09/06 0900) Cardiac Rhythm:  [-] Normal sinus rhythm (09/06 0800) Resp:  [10-29] 29 (09/06 0900) BP: (116-137)/(74-93) 137/90 mmHg (09/06 0700) SpO2:  [97 %-100 %] 100 % (09/06 0900) Arterial Line BP: (114-174)/(63-99) 166/97 mmHg (09/06 0900)  Filed Weights   10/16/12 0629 10/17/12 0438 10/18/12 0518  Weight: 141 lb 1.6 oz (64.003 kg) 141 lb 1.5 oz (64 kg) 137 lb 1.6 oz (62.188 kg)    Weight change:    Hemodynamic parameters for last 24 hours:    Intake/Output from previous day: 09/05 0701 - 09/06 0700 In: 1780 [P.O.:120; I.V.:1610; IV Piggyback:50] Out: 2110 [Urine:2090; Chest Tube:20]  Intake/Output this shift: Total I/O In: -  Out: 20 [Chest Tube:20]  Current Meds: Scheduled Meds: . acetaminophen  1,000 mg Oral Q6H   Or  . acetaminophen (TYLENOL) oral liquid 160 mg/5 mL  1,000 mg Oral Q6H  . irbesartan  300 mg Oral Daily   And  . amLODipine  10 mg Oral Daily   And  . hydrochlorothiazide  25 mg Oral Daily  . bisacodyl  10 mg Oral Daily  . enoxaparin (LOVENOX) injection  40 mg Subcutaneous Q24H  . predniSONE  40 mg Oral Q breakfast   Continuous Infusions: . dextrose 5 % and 0.45 % NaCl with KCl 20 mEq/L 100 mL/hr at 10/19/12 0609   PRN Meds:.acetaminophen, ibuprofen, ondansetron (ZOFRAN) IV, oxyCODONE-acetaminophen, oxyCODONE-acetaminophen, potassium chloride, valACYclovir  General appearance: alert, cooperative and no distress Heart: regular rate and rhythm and tachy Lungs: dim in lower  fields Abdomen: soft, nontender Wound: dressings CDI  Lab Results: CBC: Recent Labs  10/17/12 1430 10/19/12 0440  WBC 7.3 6.7  HGB 13.7 12.7  HCT 39.6 35.9*  PLT 194 157   BMET:  Recent Labs  10/17/12 1430 10/18/12 0548 10/19/12 0440  NA 136  --  135  K 3.4*  --  3.4*  CL 99  --  100  CO2 25  --  25  GLUCOSE 193*  --  87  BUN 20  --  12  CREATININE 0.58 0.70 0.58  CALCIUM 9.1  --  8.5    PT/INR:  Recent Labs  10/17/12 1430  LABPROT 11.8  INR 0.88   Radiology: Dg Chest Portable 1 View  10/18/2012   *RADIOLOGY REPORT*  Clinical Data: Status post VATS.  Line placement.  PORTABLE CHEST - 1 VIEW  Comparison: Two-view chest 10/11/2012.  Findings: A left IJ line is in place.  The tube crosses midline is near the azygos origin.  A left-sided chest tube is in place. There is no significant pneumothorax.  The interstitial edema has increased.  The heart size is prominent.  Bilateral pleural effusions are evident, right greater than left.  These have increased slightly.  IMPRESSION:  1.  Interval increase in interstitial edema and bilateral pleural effusions following bronchoalveolar lavage  and VATS. 2.  Support apparatus as above.   Original Report Authenticated By: [name], M.D.     Assessment/Plan: S/P Procedure(s) (LRB): VIDEO BRONCHOSCOPY (Left) VIDEO ASSISTED THORACOSCOPY (Left) LUNG BIOPSY (Left)  1 Doing well post op, minimal drainage, no PNTX, no air leak- will d/c chest tube 2 d/c aline and foley 3 medical management per primary    GOLD,WAYNE E 10/19/2012 9:54 AM  Chart reviewed, patient examined, agree with above.

## 2012-10-19 NOTE — Progress Notes (Signed)
[facility] Progress Note [place] TEAM 1 - Stepdown/ICU TEAM   Jamie [name] ZOX:[id number] DOB: 1965-12-03 DOA: 10/11/2012 PCP: Jamie Primes, MD  Admit HPI / Brief Narrative: [name] w/ PMHx recurrent cough, hypertension, multiple thyroid nodules w/ recent dx of thrombotic vasculitis and possible RA on chronic prednisone 10 mg daily presented complaining of rib pain and right arm pain. Patient c/o a dry cough for the past 2-3 months. For the past 4 days she has had pain to the right ribs worsening when lying down with occasional sharp shooting pains down her right arm. No associated injury or trauma. No complaints of fever, chills, productive cough, hemoptysis, dyspnea on exertion, abdominal pain, back pain, nausea, vomiting, diarrhea.  Patient states Dr. Margo [name] (Dermatology), verified vasculitis by skin biopsy in March 2014 (records not available). Seen by Jamie [name] in Feb 2014 for pulm infiltrates that cleared w/steroids and abx.   Assessment/Plan:  Pulmonary nodules / Rheumatoid vasculitis / Rash and nonspecific skin eruption  - CT scan showing multiple nodules - oral prednisone  - VATS on 10/18/2012 - Quantiferon gold negative  - ANCA negative - myeloperoxidase negative - RF <10  Arthralgia  - Morning stiffness with joint pain - improved since she has been on new dose of steroids  Mild hypokalemia Due to poor intake - supplement and follow - check Mg if persists    HYPERTENSION:  - stable, cont meds.   Code Status: FULL Family Communication: no family present at time of exam Disposition Plan: at discretion of [facility]  Consultants: PCCM [facility]  Procedures: 9/5 - Video Bronchoscopy with Bronchoalveolar Lavage + Video-assisted Thoracoscopy for Lung Biopsy  Antibiotics: none  DVT prophylaxis: lovenox  HPI/Subjective: Pt is resting comfortably post CT removal.  She denies n/v, abdom pain, or ha.  She is experiencing a modest amount of pain related to the CT site.     Objective: Blood pressure 132/90, pulse 96, temperature 97.8 F (36.6 C), temperature source Oral, resp. rate 29, height 5\' 5"  (1.651 m), weight 62.188 kg (137 lb 1.6 oz), SpO2 100.00%.  Intake/Output Summary (Last 24 hours) at 10/19/12 1510 Last data filed at 10/19/12 0800  Gross per 24 hour  Intake    760 ml  Output   1620 ml  Net   -860 ml   Exam: General: No acute respiratory distress Lungs: Clear to auscultation bilaterally without wheezes or crackles Cardiovascular: Regular rate and rhythm without murmur gallop or rub normal S1 and S2 Abdomen: Nontender, nondistended, soft, bowel sounds positive, no rebound, no ascites, no appreciable mass Extremities: No significant cyanosis, clubbing, or edema bilateral lower extremities  Data Reviewed: Basic Metabolic Panel:  Recent Labs Lab 10/14/12 0410 10/16/12 0520 10/17/12 1430 10/18/12 0548 10/19/12 0440  NA 139 143 136  --  135  K 3.6 3.5 3.4*  --  3.4*  CL 104 105 99  --  100  CO2 26 26 25   --  25  GLUCOSE 86 80 193*  --  87  BUN 21 23 20   --  12  CREATININE 0.63 0.66 0.58 0.70 0.58  CALCIUM 8.9 9.0 9.1  --  8.5   Liver Function Tests:  Recent Labs Lab 10/17/12 1430  AST 47*  ALT 46*  ALKPHOS 55  BILITOT 0.4  PROT 6.9  ALBUMIN 3.1*   CBC:  Recent Labs Lab 10/13/12 0855 10/14/12 0410 10/16/12 0520 10/17/12 1430 10/19/12 0440  WBC 12.9* 10.1 6.7 7.3 6.7  HGB 14.0 12.5 13.4 13.7  12.7  HCT 38.6 36.4 37.4 39.6 35.9*  MCV 91.3 93.3 91.2 92.1 91.8  PLT 228 187 196 194 157    Recent Results (from the past 240 hour(s))  SURGICAL PCR SCREEN     Status: None   Collection Time    10/17/12  4:55 PM      Result Value Range Status   MRSA, PCR NEGATIVE  NEGATIVE Final   Staphylococcus aureus NEGATIVE  NEGATIVE Final   Comment:            The Xpert SA Assay (FDA     approved for NASAL specimens     in patients over 48 years of age),     is one component of     a comprehensive surveillance     program.   Test performance has     been validated by [employer] for patients greater     than or equal to 27 year old.     It is not intended     to diagnose infection nor to     guide or monitor treatment.  CULTURE, RESPIRATORY (NON-EXPECTORATED)     Status: None   Collection Time    10/18/12  8:28 AM      Result Value Range Status   Specimen Description BRONCHIAL WASHINGS   Final   Special Requests PT ON ZINACEF   Final   Gram Stain PENDING   Incomplete   Culture     Final   Value: Culture reincubated for better growth     Performed at [facility]   Report Status PENDING   Incomplete  TISSUE CULTURE     Status: None   Collection Time    10/18/12  8:57 AM      Result Value Range Status   Specimen Description TISSUE LUNG LEFT   Final   Special Requests NO 2 LEFT UPPER LOBE BIOPSY PT ON BIOPSY   Final   Gram Stain PENDING   Incomplete   Culture     Final   Value: NO GROWTH 1 DAY     Performed at [facility]   Report Status PENDING   Incomplete  TISSUE CULTURE     Status: None   Collection Time    10/18/12  9:07 AM      Result Value Range Status   Specimen Description TISSUE LUNG LEFT   Final   Special Requests NO 3 LEFT LOWER LOBE BIOSPY PT ON ZINACEF   Final   Gram Stain PENDING   Incomplete   Culture     Final   Value: NO GROWTH 1 DAY     Performed at [facility]   Report Status PENDING   Incomplete     Studies:  Recent x-ray studies have been reviewed in detail by the Attending Physician  Scheduled Meds:  Scheduled Meds: . acetaminophen  1,000 mg Oral Q6H   Or  . acetaminophen (TYLENOL) oral liquid 160 mg/5 mL  1,000 mg Oral Q6H  . irbesartan  300 mg Oral Daily   And  . amLODipine  10 mg Oral Daily   And  . hydrochlorothiazide  25 mg Oral Daily  . bisacodyl  10 mg Oral Daily  . enoxaparin (LOVENOX) injection  40 mg Subcutaneous Q24H  . predniSONE  40 mg Oral Q breakfast    Time spent on care of this patient: 35  mins   Jamie [name]  [facility] Office  [phone] Pager - Text Page per Jamie [name] as per below:  On-Call/Text Page:      [name]      password [email or web address]  If 7PM-7AM, please contact night-coverage [email or web address] Password [email or web address] 10/19/2012, 3:10 PM   LOS: 8 days

## 2012-10-19 NOTE — Progress Notes (Signed)
PULMONARY  / CRITICAL CARE MEDICINE  Name: Jamie [name] MRN: [id number] DOB: 1965-08-22    ADMISSION DATE:  10/11/2012 CONSULTATION DATE:  8/29  REFERRING MD :  EDP PRIMARY SERVICE:  [facility]   CHIEF COMPLAINT:  Rib pain   BRIEF PATIENT DESCRIPTION:  47 year old female w/ recent dx of thrombotic vasculitis and possible RA. Seen by Dr [name] in Feb 2014 for pulm infiltrates that cleared w/steroids and abx. Admitted on 8/29 w/ CC: right rib pain. CT chest to r/o PE showed diffuse nodular airspace disease w/small right effusion & PCCM asked to assess.   SIGNIFICANT EVENTS / STUDIES:  8/29 CT chest>>> subpleural and bronchovascular nodules, small right effusion and a small LLL nodule which appeared to be cavitary.  Neg for PE. 9/5 >> s/p VATS Bx by [name]... All path pending at this time...  LINES / TUBES:  CULTURES:  ANTIBIOTICS:  SUBJECTIVE:  Comfortable- s/p VATS 9/5  VITAL SIGNS: Temp:  [97.3 F (36.3 C)-98.2 F (36.8 C)] 98.1 F (36.7 C) (09/06 0700) Pulse Rate:  [9-111] 89 (09/06 0415) Resp:  [10-28] 26 (09/06 0415) BP: (116-137)/(74-93) 137/90 mmHg (09/06 0700) SpO2:  [97 %-100 %] 100 % (09/06 0415) Arterial Line BP: (114-165)/(63-95) 165/91 mmHg (09/06 0415)  PHYSICAL EXAMINATION: General:  wdwn adult female in NAD Neuro:  AAOx4, s[peech clear, MAE HEENT:  Mm pink/moist, no jvd, mild oral thrush Cardiovascular:  s1s2 rrr, no m/r/g, tachy Lungs:  resp's even/non-labored, lungs bilaterally with faint fine crackles posterior. Minimal dullness R base Abdomen:  Round/soft, bsx4 active Musculoskeletal:  No acute deformties.  L>R swelling, ambulatory Skin:  Generalized skin eruptions, small round that appear like healing wounds.  No exudate or warmth   Recent Labs Lab 10/16/12 0520 10/17/12 1430 10/18/12 0548 10/19/12 0440  NA 143 136  --  135  K 3.5 3.4*  --  3.4*  CL 105 99  --  100  CO2 26 25  --  25  BUN 23 20  --  12  CREATININE 0.66 0.58 0.70 0.58   GLUCOSE 80 193*  --  87    Recent Labs Lab 10/16/12 0520 10/17/12 1430 10/19/12 0440  HGB 13.4 13.7 12.7  HCT 37.4 39.6 35.9*  WBC 6.7 7.3 6.7  PLT 196 194 157   Dg Chest Portable 1 View  10/18/2012   *RADIOLOGY REPORT*  Clinical Data: Status post VATS.  Line placement.  PORTABLE CHEST - 1 VIEW  Comparison: Two-view chest 10/11/2012.  Findings: A left IJ line is in place.  The tube crosses midline is near the azygos origin.  A left-sided chest tube is in place. There is no significant pneumothorax.  The interstitial edema has increased.  The heart size is prominent.  Bilateral pleural effusions are evident, right greater than left.  These have increased slightly.  IMPRESSION:  1.  Interval increase in interstitial edema and bilateral pleural effusions following bronchoalveolar lavage and VATS. 2.  Support apparatus as above.   Original Report Authenticated By: [name], M.D.   CXR 9/6 >> prelim w/ persist interstitial edema, basilar L>R atx etc...   ASSESSMENT / PLAN:  Thrombotic Vasculitis Bilateral Pulmonary Nodules => s/p VATS Bx 9/5 by [name] [name] Hoarseness  47 y/o AAF with dx of thrombotic vasculitis from Rheumatology ([name])- complete workup not available in EPIC.  Presents with new R sided pleuritic pain and continued skin eruptions.  Maintenance dose of prednisone is 10 mg QD.  She is planned to see  Rheumatology at [facility] the first week in September.  Presumed vasculitis acute flare with pulmonary presentation.  However, differential includes sarcoidosis vs lupus vs vasculitis vs opportunistic infxn.  ANCA negative, MPO negative.   PLAN: - CXR 9/5 & 9/6 w/ incr interstitial edema; suggest additional diuresis (getting HCT25mg /d) & follow BMet... - on PO Pred 40mg /d now... - may in fact need escalation of other immune suppressant meds (cytoxan etc), pending Bx results... - on IV Zinacef now... - agree w [name] that TBBx would probably be unhelpful in dx  vasculitis due to lack of architecture on path. Discussed the yield of FOB (to eval for infxn) vs the yield for going straight to VATS. Given her great functional status I think she should go straight to VATS > she can get path and micro data from this. She agrees w this. Will let [name] know ==> she had VATS 9/5 by [name] & path pending... - Hep C negative as outpatient per pt - [name] to follow as an outpt   [name] M. [name], MD  10/19/2012, 9:23 AM [name]

## 2012-10-20 ENCOUNTER — Inpatient Hospital Stay (HOSPITAL_COMMUNITY): Payer: 59

## 2012-10-20 LAB — D-DIMER, QUANTITATIVE: D-Dimer, Quant: 3.51 ug/mL-FEU — ABNORMAL HIGH (ref 0.00–0.48)

## 2012-10-20 MED ORDER — METOPROLOL TARTRATE 25 MG PO TABS
25.0000 mg | ORAL_TABLET | Freq: Two times a day (BID) | ORAL | Status: DC
  Administered 2012-10-20 – 2012-10-21 (×2): 25 mg via ORAL
  Filled 2012-10-20 (×4): qty 1

## 2012-10-20 MED ORDER — ENOXAPARIN SODIUM 60 MG/0.6ML ~~LOC~~ SOLN
1.0000 mg/kg | Freq: Two times a day (BID) | SUBCUTANEOUS | Status: DC
  Filled 2012-10-20 (×4): qty 0.6

## 2012-10-20 MED ORDER — METOPROLOL TARTRATE 1 MG/ML IV SOLN: 5.0000 mg | Freq: Four times a day (QID) | INTRAVENOUS | Status: DC

## 2012-10-20 MED ORDER — ENOXAPARIN SODIUM 60 MG/0.6ML ~~LOC~~ SOLN: 1.0000 mg/kg | Freq: Two times a day (BID) | SUBCUTANEOUS | Status: DC

## 2012-10-20 NOTE — Progress Notes (Addendum)
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 Patient c/o a dry cough for the past 2-3 months. For the past 4 days she has had pain to the right ribs worsening when lying down with occasional sharp shooting pains down her right arm. No associated injury or trauma. No complaints of fever, chills, productive cough, hemoptysis, dyspnea on exertion, abdominal pain, back pain, nausea, vomiting, diarrhea.  Patient states Dr. Margo Aye (Dermatology) verified vasculitis by skin biopsy in March 2014 (records not available). Seen by [name] in Feb 2014 for pulm infiltrates that cleared w/steroids and abx.   Assessment/Plan:  Sinus Tachycardia Has developed over last 24hs - pt has no other new accompanying complaints - at risk for PE though has been on lovenox - check d-dimer - TSH/FT4/FT3 were all normal/essentially normal via check last 7 days - pt not anxious - no acute resp distress   Pulmonary nodules / Rheumatoid vasculitis / Rash and nonspecific skin eruption  - CT scan showing multiple nodules - oral prednisone  - VATS on 10/18/2012 - Quantiferon gold negative  - ANCA negative - myeloperoxidase negative - RF <10  Arthralgia  - Morning stiffness with joint pain - improved since she has been on new dose of steroids  Mild hypokalemia Due to poor intake - supplement and follow - check Mg if persists    HYPERTENSION:  - stable, cont meds.   Code Status: FULL Family Communication: spoke w/ pt and signif other at bedside Disposition Plan: cleared for d/c by [facility] - need to investigate tachycardia  Consultants: PCCM [facility]  Procedures: 9/5 - Video  Bronchoscopy with Bronchoalveolar Lavage + Video-assisted Thoracoscopy for Lung Biopsy  Antibiotics: none  DVT prophylaxis: lovenox  HPI/Subjective: Pt is resting comfortably.  She denies n/v, abdom pain, or ha.  She is not aware of palpitations or tachy, but does feel weak when attempting to ambulate.  Objective: Blood pressure 122/92, pulse 108, temperature 98.3 F (36.8 C), temperature source Oral, resp. rate 21, height 5\' 5"  (1.651 m), weight 62.188 kg (137 lb 1.6 oz), SpO2 99.00%.  Intake/Output Summary (Last 24 hours) at 10/20/12 1542 Last data filed at 10/20/12 0344  Gross per 24 hour  Intake    400 ml  Output      6 ml  Net    394 ml   Exam: General: No acute respiratory distress Lungs: Clear to auscultation bilaterally without wheezes or crackles Cardiovascular: Regular rhythm with sinus tachycardia without murmur gallop or rub normal S1 and S2 Abdomen: Nontender, nondistended, soft, bowel sounds positive, no rebound, no ascites, no appreciable mass Extremities: No significant cyanosis, clubbing, or edema bilateral lower extremities  Data Reviewed: Basic Metabolic Panel:  Recent Labs Lab 10/14/12 0410 10/16/12 0520 10/17/12 1430 10/18/12 0548 10/19/12 0440  NA 139 143 136  --  135  K 3.6 3.5 3.4*  --  3.4*  CL 104 105 99  --  100  CO2 26 26 25   --  25  GLUCOSE 86 80 193*  --  87  BUN 21 23 20   --  12  CREATININE 0.63 0.66 0.58 0.70 0.58  CALCIUM 8.9 9.0 9.1  --  8.5   Liver Function Tests:  Recent Labs Lab 10/17/12 1430  AST 47*  ALT 46*  ALKPHOS 55  BILITOT 0.4  PROT 6.9  ALBUMIN 3.1*   CBC:  Recent Labs Lab 10/14/12 0410 10/16/12 0520 10/17/12 1430 10/19/12 0440  WBC 10.1 6.7 7.3 6.7  HGB 12.5 13.4 13.7 12.7  HCT 36.4 37.4 39.6 35.9*  MCV 93.3 91.2 92.1 91.8  PLT 187 196 194 157    Recent Results (from the past 240 hour(s))  SURGICAL PCR SCREEN     Status: None   Collection Time    10/17/12  4:55 PM      Result Value Range  Status   MRSA, PCR NEGATIVE  NEGATIVE Final   Staphylococcus aureus NEGATIVE  NEGATIVE Final   Comment:            The Xpert SA Assay (FDA     approved for NASAL specimens     in patients over 44 years of age),     is one component of     a comprehensive surveillance     program.  Test performance has     been validated by [employer] for patients greater     than or equal to 74 year old.     It is not intended     to diagnose infection nor to     guide or monitor treatment.  AFB CULTURE WITH SMEAR     Status: None   Collection Time    10/18/12  8:28 AM      Result Value Range Status   Specimen Description BRONCHIAL WASHINGS   Final   Special Requests PT ON ZINACEF   Final   ACID FAST SMEAR     Final   Value: NO ACID FAST BACILLI SEEN     Performed at [facility]   Culture     Final   Value: CULTURE WILL BE EXAMINED FOR 6 WEEKS BEFORE ISSUING A FINAL REPORT     Performed at [facility]   Report Status PENDING   Incomplete  CULTURE, RESPIRATORY (NON-EXPECTORATED)     Status: None   Collection Time    10/18/12  8:28 AM      Result Value Range Status   Specimen Description BRONCHIAL WASHINGS   Final   Special Requests PT ON ZINACEF   Final   Gram Stain PENDING   Incomplete   Culture     Final   Value: Culture reincubated for better growth     Performed at [facility]   Report Status PENDING   Incomplete  AFB CULTURE WITH SMEAR     Status: None   Collection Time    10/18/12  8:57 AM      Result Value Range Status   Specimen Description TISSUE LUNG LEFT   Final   Special Requests NO 2 LEFT UPPER LOBE BIOPSY PT ON ZINACEF   Final   ACID FAST SMEAR     Final   Value: NO ACID FAST BACILLI SEEN     Performed at [facility]   Culture     Final   Value: CULTURE WILL BE EXAMINED FOR 6 WEEKS BEFORE ISSUING A FINAL REPORT     Performed at [facility]   Report Status PENDING   Incomplete  TISSUE CULTURE     Status: None    Collection Time    10/18/12  8:57 AM  Result Value Range Status   Specimen Description TISSUE LUNG LEFT   Final   Special Requests NO 2 LEFT UPPER LOBE BIOPSY PT ON BIOPSY   Final   Gram Stain PENDING   Incomplete   Culture     Final   Value: NO GROWTH 1 DAY     Performed at [facility]   Report Status PENDING   Incomplete  AFB CULTURE WITH SMEAR     Status: None   Collection Time    10/18/12  9:07 AM      Result Value Range Status   Specimen Description TISSUE LUNG LEFT   Final   Special Requests NO 3 LEFT LOWER LOBE BIOSPY PT ON ZINACEF   Final   ACID FAST SMEAR     Final   Value: NO ACID FAST BACILLI SEEN     Performed at [facility]   Culture     Final   Value: CULTURE WILL BE EXAMINED FOR 6 WEEKS BEFORE ISSUING A FINAL REPORT     Performed at [facility]   Report Status PENDING   Incomplete  TISSUE CULTURE     Status: None   Collection Time    10/18/12  9:07 AM      Result Value Range Status   Specimen Description TISSUE LUNG LEFT   Final   Special Requests NO 3 LEFT LOWER LOBE BIOSPY PT ON ZINACEF   Final   Gram Stain PENDING   Incomplete   Culture     Final   Value: NO GROWTH 1 DAY     Performed at [facility]   Report Status PENDING   Incomplete     Studies:  Recent x-ray studies have been reviewed in detail by the Attending Physician  Scheduled Meds:  Scheduled Meds: . irbesartan  300 mg Oral Daily   And  . amLODipine  10 mg Oral Daily   And  . hydrochlorothiazide  25 mg Oral Daily  . bisacodyl  10 mg Oral Daily  . enoxaparin (LOVENOX) injection  40 mg Subcutaneous Q24H  . predniSONE  40 mg Oral Q breakfast    Time spent on care of this patient: 35 mins   [facility] T  [facility] Office  [phone] Pager - Text Page per Loretha Stapler as per below:  On-Call/Text Page:      [email or web address]      password [email or web address]  If 7PM-7AM, please contact night-coverage [email or web address] Password [email or web address] 10/20/2012, 3:42 PM    LOS: 9 days

## 2012-10-20 NOTE — Progress Notes (Addendum)
[facility] DAILY ICU PROGRESS NOTE                   [address]            [place] [address]          [phone]   2 Days Post-Op Procedure(s) (LRB): VIDEO BRONCHOSCOPY (Left) VIDEO ASSISTED THORACOSCOPY (Left) LUNG BIOPSY (Left)  Total Length of Stay:  LOS: 9 days   Subjective: Looks to be doing well, feels fairly well  Objective: Vital signs in last 24 hours: Temp:  [97.8 F (36.6 C)-98.4 F (36.9 C)] 98.3 F (36.8 C) (09/07 0700) Pulse Rate:  [102-124] 108 (09/07 0736) Cardiac Rhythm:  [-] Normal sinus rhythm (09/07 0800) Resp:  [21-34] 21 (09/07 0736) BP: (122-138)/(88-99) 122/92 mmHg (09/07 0736) SpO2:  [99 %-100 %] 99 % (09/07 0736)  Filed Weights   10/16/12 0629 10/17/12 0438 10/18/12 0518  Weight: 141 lb 1.6 oz (64.003 kg) 141 lb 1.5 oz (64 kg) 137 lb 1.6 oz (62.188 kg)    Weight change:    Hemodynamic parameters for last 24 hours:    Intake/Output from previous day: 09/06 0701 - 09/07 0700 In: 1000 [P.O.:960; I.V.:40] Out: 26 [Urine:5; Stool:1; Chest Tube:20]  Intake/Output this shift:    Current Meds: Scheduled Meds: . irbesartan  300 mg Oral Daily   And  . amLODipine  10 mg Oral Daily   And  . hydrochlorothiazide  25 mg Oral Daily  . bisacodyl  10 mg Oral Daily  . enoxaparin (LOVENOX) injection  40 mg Subcutaneous Q24H  . predniSONE  40 mg Oral Q breakfast   Continuous Infusions: . sodium chloride 20 mL (10/19/12 1600)   PRN Meds:.acetaminophen, ibuprofen, ondansetron (ZOFRAN) IV, oxyCODONE-acetaminophen, oxyCODONE-acetaminophen, valACYclovir  General appearance: alert, cooperative and no distress Heart: regular rate and rhythm and tachy Lungs: dim air exchange throughout Abdomen: benign Extremities: no edema Wound: incis ok, dressings CDI  Lab Results: CBC: Recent Labs  10/17/12 1430 10/19/12 0440  WBC 7.3 6.7  HGB 13.7 12.7  HCT 39.6 35.9*  PLT 194 157   BMET:  Recent Labs  10/17/12 1430 10/18/12 0548  10/19/12 0440  NA 136  --  135  K 3.4*  --  3.4*  CL 99  --  100  CO2 25  --  25  GLUCOSE 193*  --  87  BUN 20  --  12  CREATININE 0.58 0.70 0.58  CALCIUM 9.1  --  8.5    PT/INR:  Recent Labs  10/17/12 1430  LABPROT 11.8  INR 0.88   Radiology: Dg Chest 2 View  10/20/2012   *RADIOLOGY REPORT*  Clinical Data: Follow up post chest tube removal.  CHEST - 2 VIEW  Comparison: 10/19/2012 at 0759 a.m. and 1454 p.m.  Findings: Left IJ central venous catheter unchanged.  Examination demonstrates the lungs to be adequately inflated as there is again evidence of a tiny left apical pneumothorax without significant change from the prior exam 10/19/2012 at 07:59 a.m. There is no change and mild patchy opacification bilaterally of coarse interstitial changes.  Small right pleural effusions slightly worse.  Likely small amount of left pleural fluid.  Minimal subcutaneous emphysema over the left neck base unchanged. Remainder of the exam is unchanged.  IMPRESSION: No change and mild bilateral patchy airspace density and coarse increased interstitial markings.  Slight worsening of a small right pleural effusion.  Likely small amount of left pleural fluid.  Tiny left apical  pneumothorax unchanged.  Left IJ central venous catheter unchanged.   Original Report Authenticated By: Elberta Fortis, M.D.   Dg Chest Port 1 View  10/19/2012   *RADIOLOGY REPORT*  Clinical Data: Chest tube status  PORTABLE CHEST - 1 VIEW  Comparison: Prior chest x-ray 10/18/2012  Findings: Surgical changes of left upper lobe wedge resection/biopsy.  Left thoracostomy tube in place. Tiny left apical pneumothorax.  Small subcutaneous emphysema along the left chest and neck.  Left IJ central venous catheter with the tip in the mid SVC.  Layering right pleural effusion and associated right greater than left basilar atelectasis.  Slightly improved pulmonary edema.  IMPRESSION:  1.  Trace left apical pneumothorax with thoracostomy tube in place. 2.   Subcutaneous emphysema in the soft tissues of the left neck and chest. 3.  Slightly improved interstitial edema. 4.  Layering right pleural effusion right greater than left basilar atelectasis versus infiltrate.   Original Report Authenticated By: Malachy Moan, M.D.   Dg Chest Port 1v Same Day  10/19/2012   *RADIOLOGY REPORT*  Clinical Data: Post chest tube removal  PORTABLE CHEST - 1 VIEW SAME DAY  Comparison: Earlier same day; 10/18/2012; 10/11/2012; chest CT - 10/11/2012  Findings:  Grossly unchanged cardiac silhouette and mediastinal contours. Interval removal of left-sided chest tube without definitive development of a pneumothorax.  Stable positioning of left jugular approach central venous catheter with tip projected over the superior/mid SVC.  Redemonstrated small right and suspected trace left-sided pleural effusions.  Rather extensive bilateral ill- defined heterogeneous nodular opacities with relative area of confluence within the peripheral aspect the left lung are grossly unchanged.  No new focal airspace opacities.  Unchanged bones. There is a minimal amount of residual subcutaneous emphysema.  IMPRESSION: 1.  Interval removal of left-sided chest tube without development of pneumothorax. 2.  Grossly unchanged small right and suspected trace left-sided pleural effusions. 3.  Grossly unchanged scattered bilateral heterogeneous nodular opacities, left greater than right, as was demonstrated and recently performed chest CT.  No new focal airspace opacities.   Original Report Authenticated By: Tacey Ruiz, MD     Assessment/Plan: S/P Procedure(s) (LRB): VIDEO BRONCHOSCOPY (Left) VIDEO ASSISTED THORACOSCOPY (Left) LUNG BIOPSY (Left)  1 doing well, tiny stable L apical pntx<5% 2 cont pulm toilet, mobilization 3 medical management as per primary    GOLD,WAYNE E 10/20/2012 11:12 AM  Chart reviewed, patient examined, agree with above. CXR is stable. She can go home from a surgical  standpoint when medicine feels it is ok. She probably should remain on steroids until the final path comes back which may be a week or more if it is sent out for 2nd opinion.

## 2012-10-21 ENCOUNTER — Encounter (HOSPITAL_COMMUNITY): Payer: Self-pay | Admitting: Thoracic Surgery (Cardiothoracic Vascular Surgery)

## 2012-10-21 LAB — CULTURE, RESPIRATORY W GRAM STAIN

## 2012-10-21 LAB — TISSUE CULTURE
Culture: NO GROWTH
Culture: NO GROWTH

## 2012-10-21 LAB — BASIC METABOLIC PANEL
CO2: 26 mEq/L (ref 19–32)
Glucose, Bld: 88 mg/dL (ref 70–99)
Potassium: 3.7 mEq/L (ref 3.5–5.1)
Sodium: 135 mEq/L (ref 135–145)

## 2012-10-21 LAB — MAGNESIUM: Magnesium: 1.8 mg/dL (ref 1.5–2.5)

## 2012-10-21 LAB — CBC
HCT: 37.7 % (ref 36.0–46.0)
MCV: 92.2 fL (ref 78.0–100.0)
Platelets: 151 10*3/uL (ref 150–400)
RBC: 4.09 MIL/uL (ref 3.87–5.11)
WBC: 5.8 10*3/uL (ref 4.0–10.5)

## 2012-10-21 MED ORDER — PREDNISONE 20 MG PO TABS: 40.0000 mg | ORAL_TABLET | Freq: Every day | ORAL | Status: DC

## 2012-10-21 MED ORDER — OXYCODONE-ACETAMINOPHEN 5-325 MG PO TABS: 1.0000 | ORAL_TABLET | ORAL | Status: DC | PRN

## 2012-10-21 MED ORDER — METOPROLOL TARTRATE 25 MG PO TABS: 25.0000 mg | ORAL_TABLET | Freq: Two times a day (BID) | ORAL | Status: DC

## 2012-10-21 NOTE — Progress Notes (Signed)
[facility] BRIEF PROGRESS NOTE   Doing well Ready for d/c home Instructions given Will f/u in my office 2 weeks w/ CXR Medical Rx per medical teams  [facility] H 10/21/2012 8:56 AM

## 2012-10-21 NOTE — Progress Notes (Signed)
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 Seen by Dr [name] in Feb 2014 for pulm infiltrates that cleared w/steroids and abx. Admitted on 8/29 w/ CC: right rib pain. CT chest to r/o PE showed diffuse nodular airspace disease w/small right effusion . Underwent VATS biopsy 9/5   SIGNIFICANT EVENTS / STUDIES:  8/29 CT chest>>> subpleural and bronchovascular nodules, small right effusion and a small LLL nodule which appeared to be cavitary.  Neg for PE. 9/5 >> s/p VATS Bx by [name]... All path pending at this time...   SUBJECTIVE:  Comfortable- s/p VATS 9/5  VITAL SIGNS: Temp:  [98 F (36.7 C)-98.4 F (36.9 C)] 98.2 F (36.8 C) (09/08 0711) Pulse Rate:  [92-110] 92 (09/08 0340) Resp:  [16-32] 32 (09/08 0711) BP: (113-139)/(72-93) 139/93 mmHg (09/08 0711) SpO2:  [98 %-99 %] 99 % (09/08 0711)  PHYSICAL EXAMINATION: General:  wdwn adult female in NAD Neuro:  AAOx4, s[peech clear, MAE HEENT:  Mm pink/moist, no jvd, mild oral thrush Cardiovascular:  s1s2 rrr, no m/r/g, tachy Lungs:  resp's even/non-labored, lungs bilaterally with faint fine crackles posterior. Minimal dullness R base Abdomen:  Round/soft, bsx4 active Musculoskeletal:  No acute deformties.  L>R swelling, ambulatory Skin:  Generalized skin eruptions, small round that appear like healing wounds.  No exudate or warmth   Recent Labs Lab 10/17/12 1430 10/18/12 0548 10/19/12 0440 10/21/12 0600  NA 136  --  135 135  K 3.4*  --  3.4* 3.7  CL 99  --  100 97  CO2 25  --  25 26  BUN 20  --  12 18  CREATININE 0.58 0.70 0.58 0.59  GLUCOSE 193*  --  87 88    Recent Labs Lab 10/17/12 1430 10/19/12 0440 10/21/12 0600  HGB 13.7 12.7 13.2   HCT 39.6 35.9* 37.7  WBC 7.3 6.7 5.8  PLT 194 157 151   Dg Chest 2 View  10/20/2012   *RADIOLOGY REPORT*  Clinical Data: Follow up post chest tube removal.  CHEST - 2 VIEW  Comparison: 10/19/2012 at 0759 a.m. and 1454 p.m.  Findings: Left IJ central venous catheter unchanged.  Examination demonstrates the lungs to be adequately inflated as there is again evidence of a tiny left apical pneumothorax without significant change from the prior exam 10/19/2012 at 07:59 a.m. There is no change and mild patchy opacification bilaterally of coarse interstitial changes.  Small right pleural effusions slightly worse.  Likely small amount of left pleural fluid.  Minimal subcutaneous emphysema over the left neck base unchanged. Remainder of the exam is unchanged.  IMPRESSION: No change and mild bilateral patchy airspace density and coarse increased interstitial markings.  Slight worsening of a small right pleural effusion.  Likely small amount of left pleural fluid.  Tiny left apical pneumothorax unchanged.  Left IJ central venous catheter unchanged.   Original Report Authenticated By: Elberta Fortis, M.D.   Dg Chest Port 1v Same Day  10/19/2012   *RADIOLOGY REPORT*  Clinical Data: Post chest tube removal  PORTABLE CHEST - 1 VIEW SAME DAY  Comparison: Earlier same day; 10/18/2012; 10/11/2012; chest CT - 10/11/2012  Findings:  Grossly unchanged cardiac silhouette and mediastinal  contours. Interval removal of left-sided chest tube without definitive development of a pneumothorax.  Stable positioning of left jugular approach central venous catheter with tip projected over the superior/mid SVC.  Redemonstrated small right and suspected trace left-sided pleural effusions.  Rather extensive bilateral ill- defined heterogeneous nodular opacities with relative area of confluence within the peripheral aspect the left lung are grossly unchanged.  No new focal airspace opacities.  Unchanged bones. There is a minimal amount of residual  subcutaneous emphysema.  IMPRESSION: 1.  Interval removal of left-sided chest tube without development of pneumothorax. 2.  Grossly unchanged small right and suspected trace left-sided pleural effusions. 3.  Grossly unchanged scattered bilateral heterogeneous nodular opacities, left greater than right, as was demonstrated and recently performed chest CT.  No new focal airspace opacities.   Original Report Authenticated By: Tacey Ruiz, MD   CXR 9/6 >>  persist interstitial edema, basilar L>R atx etc...   ASSESSMENT / PLAN:  Thrombotic Vasculitis Bilateral Pulmonary Nodules => s/p VATS Bx 9/5 by [name] [name] Hoarseness  47 y/o AAF with dx of thrombotic vasculitis from Rheumatology ([name])- complete workup not available in EPIC.  Presents with new R sided pleuritic pain and continued skin eruptions.  Maintenance dose of prednisone is 10 mg QD.  She is planned to see Rheumatology at [facility] the first week in September.  Presumed vasculitis acute flare with pulmonary presentation.  However, differential includes sarcoidosis vs lupus vs vasculitis vs opportunistic infxn.  ANCA negative, MPO negative.   PLAN: - on PO Pred 40mg /d now... - may in fact need escalation of other immune suppressant meds (cytoxan etc), pending Bx results... - she had VATS 9/5 by [name] & path pending - Hep C negative as outpatient per pt - rheum follow as an outpt -have arranged FU with Dr wert in 1 week, can dc on pred 40 mg   Cyril Mourning MD. [facility]. [place] [facility] Pager [phone] If no response call [phone]     10/21/2012, 9:39 AM

## 2012-10-21 NOTE — Progress Notes (Signed)
Pt discharged home per MD order. All discharge instructions reviewed and all questions answered.  

## 2012-10-21 NOTE — Discharge Summary (Signed)
DISCHARGE SUMMARY  Jamie [name]  MR#: [id number]  DOB:1965-07-04  Date of Admission: 10/11/2012 Date of Discharge: 10/21/2012  Attending Physician:MCCLUNG,JEFFREY T  Patient's QIO:NGEX Plotnikov, MD  Consults: [facility]  [facility] - Dr. [name]   Disposition: D/C home - FMLA paperwork completed with return to work date of 10/28/2012  Follow-up Appts:     Follow-up Information   Follow up with Sandrea Hughs, MD On 10/28/2012. (3-15 pm)    Specialty:  Pulmonary Disease   Contact information:   [address] [place] [address] [phone]       Follow up with Purcell Nails, MD. Schedule an appointment as soon as possible for a visit in 14 days.   Specialty:  Cardiothoracic Surgery   Contact information:   [address] [place] [address] [phone]       Follow up with Sonda Primes, MD. Schedule an appointment as soon as possible for a visit in 5 days.   Specialty:  Internal Medicine   Contact information:   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 Patient c/o a dry cough for the past 2-3 months. For 4 days she had pain to the right ribs worsening when lying down with occasional sharp shooting pains down her right arm. No associated injury or trauma. No complaints of fever, chills, productive cough, hemoptysis, dyspnea on exertion, abdominal pain, back pain, nausea, vomiting, diarrhea. Patient stated Dr. Margo Aye (Dermatology) verified  vasculitis by skin biopsy in March 2014 (records not available). Seen by Dr. [name] in Feb 2014 for pulm infiltrates that cleared w/steroids and abx.   Hospital Course:  Pulmonary nodules / Rheumatoid vasculitis / Rash and nonspecific skin eruption  - CT scan showing multiple nodules - oral prednisone to continue at 40mg /day - per Pulm "may in fact need escalation of other immune suppressant meds (cytoxan etc), pending Bx results" - VATS on 10/18/2012 - path results pending at time of D/C - Quantiferon gold negative  - ANCA negative - myeloperoxidase negative - RF <10  - to f/u w/ Dr. [name] in 1 week  Sinus Tachycardia  developed over last 24hs of hospital stay - pt had no other new accompanying complaints - at risk for PE though has been on lovenox - d-dimer elevated, but pt had dopplers and CT angio in early hospitalization that were negative for clot - no lower extremity swelling or chest pain/sob - TSH/FT4/FT3 were all normal/essentially normal via check - pt not anxious - no acute resp distress - improved markedly with use of BB - pt advised to seek medical attention via 911 should she develop chest pain, severe palpitations, or SOB  Arthralgia  - Morning stiffness with joint pain - improved since she has been on new dose of steroids   Mild hypokalemia  Due to poor intake - improved w/ supplementation  HYPERTENSION:  - stable    Medication List         metoprolol tartrate 25 MG tablet  Commonly known as:  LOPRESSOR  Take 1 tablet (25 mg total) by mouth 2 (two) times daily.     Olmesartan-Amlodipine-HCTZ 40-10-25 MG Tabs  Take 1 tablet by mouth daily.     oxyCODONE-acetaminophen 5-325 MG per tablet  Commonly known as:  PERCOCET/ROXICET  Take 1-2 tablets by mouth every 4 (four) hours as needed.     predniSONE 20 MG tablet  Commonly known as:  DELTASONE  Take 2 tablets (40 mg total) by mouth daily with breakfast.     valACYclovir 500 MG tablet  Commonly known as:  VALTREX   Take 500 mg by mouth daily as needed.        Day of Discharge BP 139/93  Pulse 92  Temp(Src) 98.2 F (36.8 C) (Oral)  Resp 32  Ht 5\' 5"  (1.651 m)  Wt 62.188 kg (137 lb 1.6 oz)  BMI 22.81 kg/m2  SpO2 99%  Physical Exam: General: No acute respiratory distress Lungs: Clear to auscultation bilaterally without wheezes or crackles Cardiovascular: Regular rate and rhythm without murmur gallop or rub normal S1 and S2 Abdomen: Nontender, nondistended, soft, bowel sounds positive, no rebound, no ascites, no appreciable mass Extremities: No significant cyanosis, clubbing, or edema bilateral lower extremities  Results for orders placed during the hospital encounter of 10/11/12 (from the past 24 hour(s))  D-DIMER, QUANTITATIVE     Status: Abnormal   Collection Time    10/20/12  4:15 PM      Result Value Range   D-Dimer, Quant 3.51 (*) 0.00 - 0.48 ug/mL-FEU  BASIC METABOLIC PANEL     Status: None   Collection Time    10/21/12  6:00 AM      Result Value Range   Sodium 135  135 - 145 mEq/L   Potassium 3.7  3.5 - 5.1 mEq/L   Chloride 97  96 - 112 mEq/L   CO2 26  19 - 32 mEq/L   Glucose, Bld 88  70 - 99 mg/dL   BUN 18  6 - 23 mg/dL   Creatinine, Ser 4.54  0.50 - 1.10 mg/dL   Calcium 8.8  8.4 - 09.8 mg/dL   GFR calc non Af Amer >90  >90 mL/min   GFR calc Af Amer >90  >90 mL/min  MAGNESIUM     Status: None   Collection Time    10/21/12  6:00 AM      Result Value Range   Magnesium 1.8  1.5 - 2.5 mg/dL  CBC     Status: None   Collection Time    10/21/12  6:00 AM      Result Value Range   WBC 5.8  4.0 - 10.5 K/uL   RBC 4.09  3.87 - 5.11 MIL/uL   Hemoglobin 13.2  12.0 - 15.0 g/dL   HCT 11.9  14.7 - 82.9 %   MCV 92.2  78.0 - 100.0 fL   MCH 32.3  26.0 - 34.0 pg   MCHC 35.0  30.0 - 36.0 g/dL   RDW 56.2  13.0 - 86.5 %   Platelets 151  150 - 400 K/uL    Time spent in discharge (includes decision making & examination of pt): >30 minutes  10/21/2012, 12:06 PM   Lonia Blood, MD [facility] Office  [phone] Pager [phone]  On-Call/Text Page:      [email or web address]      password [facility]

## 2012-10-25 ENCOUNTER — Ambulatory Visit (INDEPENDENT_AMBULATORY_CARE_PROVIDER_SITE_OTHER): Payer: 59 | Admitting: Internal Medicine

## 2012-10-25 ENCOUNTER — Encounter: Payer: Self-pay | Admitting: Internal Medicine

## 2012-10-25 VITALS — BP 138/98 | HR 72 | Temp 98.4°F | Resp 16 | Wt 144.0 lb

## 2012-10-25 DIAGNOSIS — D45 Polycythemia vera: Secondary | ICD-10-CM

## 2012-10-25 DIAGNOSIS — B370 Candidal stomatitis: Secondary | ICD-10-CM

## 2012-10-25 DIAGNOSIS — L959 Vasculitis limited to the skin, unspecified: Secondary | ICD-10-CM

## 2012-10-25 DIAGNOSIS — R918 Other nonspecific abnormal finding of lung field: Secondary | ICD-10-CM

## 2012-10-25 DIAGNOSIS — M2550 Pain in unspecified joint: Secondary | ICD-10-CM

## 2012-10-25 DIAGNOSIS — L988 Other specified disorders of the skin and subcutaneous tissue: Secondary | ICD-10-CM

## 2012-10-25 DIAGNOSIS — D751 Secondary polycythemia: Secondary | ICD-10-CM

## 2012-10-25 MED ORDER — FLUCONAZOLE 100 MG PO TABS: ORAL_TABLET | ORAL | Status: DC

## 2012-10-25 MED ORDER — ONDANSETRON HCL 4 MG PO TABS: 4.0000 mg | ORAL_TABLET | Freq: Three times a day (TID) | ORAL | Status: DC | PRN

## 2012-10-25 MED ORDER — OLMESARTAN-AMLODIPINE-HCTZ 40-10-25 MG PO TABS: 1.0000 | ORAL_TABLET | Freq: Every day | ORAL | Status: DC

## 2012-10-25 NOTE — Patient Instructions (Signed)
Gluten free trial (no wheat products) for 4-6 weeks. OK to use gluten-free bread and gluten-free pasta.  Milk free trial (no milk, ice cream, cheese and yogurt) for 4-6 weeks. OK to use almond, coconut, rice or soy milk. "Almond breeze" brand tastes good.  

## 2012-10-25 NOTE — Assessment & Plan Note (Signed)
Rhem cons at [facility] Dr Ang to sch

## 2012-10-25 NOTE — Assessment & Plan Note (Signed)
CBC

## 2012-10-25 NOTE — Assessment & Plan Note (Signed)
S/p skin bx

## 2012-10-25 NOTE — Progress Notes (Signed)
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 Patient c/o a dry cough for the past 2-3 months. For 4 days she had pain to the right ribs worsening when lying down with occasional sharp shooting pains down her right arm. No associated injury or trauma. No complaints of fever, chills, productive cough, hemoptysis, dyspnea on exertion, abdominal pain, back pain, nausea, vomiting, diarrhea. Patient stated Dr. Margo Aye (Dermatology) verified vasculitis by skin biopsy in March 2014 (records not available). Seen by Dr. [name] in Feb 2014 for pulm infiltrates that cleared w/steroids and abx.  Hospital Course:  Pulmonary nodules / Rheumatoid vasculitis / Rash and nonspecific skin eruption  - CT scan showing multiple nodules - oral prednisone to continue at 40mg /day  - per Pulm "may in fact need escalation of other immune suppressant meds (cytoxan etc), pending Bx results"  - VATS on 10/18/2012 - path results pending at time of D/C  - Quantiferon gold negative  - ANCA negative - myeloperoxidase negative - RF <10  - to f/u w/ Dr. [name] in 1 week  Sinus Tachycardia  developed over last 24hs of hospital stay - pt had no other new accompanying complaints - at risk for PE though has been on lovenox - d-dimer elevated, but pt had dopplers and CT angio in early hospitalization that were negative for clot - no lower extremity swelling or chest pain/sob - TSH/FT4/FT3 were all normal/essentially normal via check - pt not anxious - no acute resp distress - improved markedly with use of BB - pt advised to seek medical attention via 911  should she develop chest pain, severe palpitations, or SOB  Arthralgia  - Morning stiffness with joint pain - improved since she has been on new dose of steroids  Mild hypokalemia  Due to poor intake - improved w/ supplementation  HYPERTENSION:  - stable   Lately overall doing ok,  Pt has chest pain L, no increased sob or doe, wheezing, orthopnea, PND, no increased LE swelling, palpitations, dizziness or syncope.  Pt denies polydipsia, polyuria, or low sugar symptoms   Pt denies new neurological symptoms such as new headache, or facial or extremity weakness or numbness.   Pt states overall good compliance with meds but may have some confusion about her meds and is taking the edarbi (stopped the bystolic as she thought this was what was intended).   Has had several BP elevated recently at opthomology and rheumatology, asked to f/u here. In retrospect, HCTZ likely did not cause the rash. 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 She would like to start seeing a different rheumatologist Dr Ang   Past Medical History  Diagnosis Date  . Hypertension   . Multiple thyroid nodules   . History of cold sores   . 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 Photosensitivity 04/17/2012    3/14 new   . Rheumatoid arthritis   . Pulmonary nodules/lesions, multiple 10/13/2012   Past Surgical History  Procedure Laterality Date  . Abdominal  hysterectomy    . Video bronchoscopy Left 10/18/2012    Procedure: VIDEO BRONCHOSCOPY;  Surgeon: Purcell Nails, MD;  Location: [facility] OR;  Service: Thoracic;  Laterality: Left;  . Video assisted thoracoscopy Left 10/18/2012    Procedure: VIDEO ASSISTED THORACOSCOPY;  Surgeon: Purcell Nails, MD;  Location: [facility] OR;  Service: Thoracic;  Laterality: Left;  . Lung biopsy Left 10/18/2012    Procedure: LUNG BIOPSY;  Surgeon: Purcell Nails, MD;  Location: [facility] OR;  Service:  Thoracic;  Laterality: Left;    reports that she has never smoked. She has never used smokeless tobacco. She reports that she does not drink alcohol or use illicit drugs. family history includes Cancer (age of onset: 31) in her father; Hypertension in her mother and other. Allergies  Allergen Reactions  . Benazepril Hcl     REACTION: cough  . Ciprofloxacin     REACTION: Rhabdomyolysis  . Tramadol     n/v   Current Outpatient Prescriptions on File Prior to Visit  Medication Sig Dispense Refill  . metoprolol tartrate (LOPRESSOR) 25 MG tablet Take 1 tablet (25 mg total) by mouth 2 (two) times daily.  60 tablet  0  . Olmesartan-Amlodipine-HCTZ 40-10-25 MG TABS Take 1 tablet by mouth daily.  90 tablet  3  . oxyCODONE-acetaminophen (PERCOCET/ROXICET) 5-325 MG per tablet Take 1-2 tablets by mouth every 4 (four) hours as needed.  30 tablet  0  . predniSONE (DELTASONE) 20 MG tablet Take 2 tablets (40 mg total) by mouth daily with breakfast.  60 tablet  0  . valACYclovir (VALTREX) 500 MG tablet Take 500 mg by mouth daily as needed.       No current facility-administered medications on file prior to visit.       Review of Systems  Constitutional: Positive for fatigue. Negative for fever, chills, activity change, appetite change and unexpected weight change.  HENT: Negative for congestion, mouth sores and sinus pressure.   Eyes: Negative for visual disturbance.  Respiratory: Negative for cough and chest tightness.   Gastrointestinal: Negative for nausea and abdominal pain.  Genitourinary: Negative for frequency, difficulty urinating and vaginal pain.  Musculoskeletal: Positive for back pain and arthralgias. Negative for gait problem.  Skin: Positive for rash. Negative for color change, pallor and wound.  Neurological: Negative for dizziness, tremors, weakness, numbness and headaches.  Psychiatric/Behavioral: Negative for suicidal ideas, confusion and sleep disturbance. The patient is not  nervous/anxious.        Objective:   Physical Exam  Constitutional: She appears well-developed. No distress.  HENT:  Head: Normocephalic.  Right Ear: External ear normal.  Left Ear: External ear normal.  Nose: Nose normal.  Mouth/Throat: Oropharynx is clear and moist.  Eyes: Conjunctivae are normal. Pupils are equal, round, and reactive to light. Right eye exhibits no discharge. Left eye exhibits no discharge.  Neck: Normal range of motion. Neck supple. No JVD present. No tracheal deviation present. No thyromegaly present.  Cardiovascular: Normal rate, regular rhythm and normal heart sounds.   Pulmonary/Chest: No stridor. No respiratory distress. She has no wheezes.  Abdominal: Soft. Bowel sounds are normal. She exhibits no distension and no mass. There is no tenderness. There is no rebound and no guarding.  Musculoskeletal: She exhibits no edema and no tenderness.  Lymphadenopathy:    She has no cervical adenopathy.  Neurological: She displays normal reflexes. No cranial nerve deficit. She exhibits normal muscle tone. Coordination normal.  Skin: Rash noted. No erythema.  Small necrotic ulcers  on palms and fingers  Psychiatric: She has a normal mood and affect. Her behavior is normal. Judgment and thought content normal.   Ginette Pitman is present      Assessment & Plan:

## 2012-10-25 NOTE — Assessment & Plan Note (Signed)
L VATS - Path pending Rhem cons at [facility] Dr Ang On Prednisone

## 2012-10-25 NOTE — Assessment & Plan Note (Signed)
Diflucan

## 2012-10-25 NOTE — Assessment & Plan Note (Signed)
Rhem cons at [facility] Dr Ang to sch On Prednisone

## 2012-10-26 ENCOUNTER — Encounter: Payer: Self-pay | Admitting: Internal Medicine

## 2012-10-28 ENCOUNTER — Ambulatory Visit (INDEPENDENT_AMBULATORY_CARE_PROVIDER_SITE_OTHER): Payer: 59 | Admitting: Internal Medicine

## 2012-10-28 ENCOUNTER — Encounter: Payer: Self-pay | Admitting: Internal Medicine

## 2012-10-28 VITALS — BP 102/72 | HR 106 | Temp 98.2°F | Ht 67.0 in | Wt 148.0 lb

## 2012-10-28 DIAGNOSIS — R05 Cough: Secondary | ICD-10-CM

## 2012-10-28 DIAGNOSIS — R918 Other nonspecific abnormal finding of lung field: Secondary | ICD-10-CM

## 2012-10-28 NOTE — Assessment & Plan Note (Signed)
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

## 2012-10-28 NOTE — Progress Notes (Signed)
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 Pna s/p vats bx 10/18/12 for nodular lung dz ? Etiology with no specific dx per [place]    History of Present Illness  03/08/2012 1st pulmonary eval cc new onset abrupt dry cough and sob 02/27/11  in setting of polyarthralgia x 6 weeks already on prednisone and was being tapered down because arthritis was getting some better  when resp symptoms flare and no better with symbicort.  Started on augmentin 1/23 with chills 1/19  And temp to 101 nothing since then.   rec GERD diet Use tussionex for cough Finish antibiotics   04/04/2012 f/u ov/Wert cc much better, no fever, cough some better but still very hoarse and looses breath when talking but otherwise feeling fine, finished pred x one week with no flare of cough, doe or arthritis rec Stop micardis Start bystolic 5 mg twice daily  If cough worse:  Delsym cough syrup Try prilosec 20mg   Take 30-60 min before first meal of the day and Pepcid 20 mg one bedtime until cough and hoarsenss is completely gone for at least a week without the need for cough suppression Pulmonary is as needed if cough not improving or breathing worse   Arthritis assoc with rash, better with prednisone, Dr Kellie Simmering thought it was RA with rec mtx , then abrupt R CP > admit with MPN's and bx 9/5/014 on L > no dx per [facility] > rx 40mg  per day > referred for [facility] eval in Rheumatology     10/28/2012 post hosp f/u re ? ILD has not been seen at [facility], still on 20 mg per day Chief Complaint  Patient presents with  . Hospitalization Follow-up    States feels better since leaving the hospital but is not at 100%.  cough is variable, mostly dry, worse when bend over,   R pleuritic cp much better, rash and arthritis  improving but mostly in shoulders and hips, sparing hands and ankles and feet.  No obvious daytime variabilty or assoc purulent sputum or cp or chest tightness, subjective wheeze overt sinus or hb symptoms. No unusual exp hx or h/o childhood pna/ asthma or premature birth to her knowledge.   Sleeping ok without nocturnal  or early am exacerbation  of respiratory  c/o's or need for noct saba. Also denies any obvious fluctuation of symptoms with weather or environmental changes or other aggravating or alleviating factors except as outlined above.  Current Medications, Allergies, Complete Past Medical History, Past Surgical History, Family History, and Social History were reviewed in [employer] record.  ROS  The following are not active complaints unless bolded sore throat, dysphagia, dental problems, itching, sneezing,  nasal congestion or excess/ purulent secretions, ear ache,   fever, chills, sweats, unintended wt loss, pleuritic cp or exertional cp, hemoptysis,  orthopnea pnd or leg swelling, presyncope, palpitations, heartburn, abdominal pain, anorexia, nausea, vomiting, diarrhea  or change in bowel or urinary habits, change in stools or urine, dysuria,hematuria,  rash, arthralgias, visual complaints, headache, numbness weakness or ataxia or problems with walking or coordination,  change in mood/affect or memory.              Objective:   Physical Exam  10/28/2012  148   Wt Readings from Last 3 Encounters:  03/08/12 159 lb 12.8 oz (72.485 kg)  03/06/12 162 lb (73.483 kg)  02/13/12 165 lb 4 oz (74.957 kg)    amb bf with dry cough and throat clearing   HEENT: nl dentition, turbinates, and orophanx. Nl external ear canals without cough reflex   NECK :  without JVD/Nodes/TM/ nl carotid upstrokes bilaterally   LUNGS: no acc muscle use, clear to A and P bilaterally without cough on insp or exp maneuvers   CV:  RRR  no s3 or murmur or increase in P2, no  edema   ABD:  soft and nontender with nl excursion in the supine position. No bruits or organomegaly, bowel sounds nl  MS:  warm without deformities, calf tenderness, cyanosis or clubbing  SKIN: warm and dry without lesions    NEURO:  alert, approp, no deficits      . cxr 10/20/12 No change and mild bilateral patchy airspace density and coarse  increased interstitial markings. Slight worsening of a small right  pleural effusion. Likely small amount of left pleural fluid.  Tiny left apical pneumothorax unchanged       Assessment & Plan:

## 2012-10-28 NOTE — Assessment & Plan Note (Signed)
Flare bending over is classic for gerd related cough > rec max gerd rx/ diet esp while still on sterids, which increase gastric acidity.  See instructions for specific recommendations which were reviewed directly with the patient who was given a copy with highlighter outlining the key components.

## 2012-10-28 NOTE — Patient Instructions (Addendum)
Keep Wake appt to evaluated arthrititis and rash  Stay on 40 mg prednisone daily until seen   Keep appt for cxr with [name]  If cough worse:  Delsym cough syrup Try prilosec 20mg   Take 30-60 min before first meal of the day and Pepcid 20 mg one bedtime until cough and hoarsenss is completely gone for at least a week without the need for cough suppression  Pulmonary is as needed if cough not improving or breathing worse

## 2012-10-29 ENCOUNTER — Other Ambulatory Visit: Payer: Self-pay | Admitting: *Deleted

## 2012-11-06 ENCOUNTER — Other Ambulatory Visit: Payer: Self-pay | Admitting: *Deleted

## 2012-11-11 ENCOUNTER — Other Ambulatory Visit: Payer: Self-pay | Admitting: *Deleted

## 2012-11-11 ENCOUNTER — Encounter: Payer: Self-pay | Admitting: Thoracic Surgery (Cardiothoracic Vascular Surgery)

## 2012-11-11 ENCOUNTER — Ambulatory Visit (INDEPENDENT_AMBULATORY_CARE_PROVIDER_SITE_OTHER): Payer: 59 | Admitting: Thoracic Surgery (Cardiothoracic Vascular Surgery)

## 2012-11-11 ENCOUNTER — Ambulatory Visit
Admission: RE | Admit: 2012-11-11 | Discharge: 2012-11-11 | Disposition: A | Discharge status: Home / Self Care | Payer: 59 | Source: Ambulatory Visit | Attending: Thoracic Surgery (Cardiothoracic Vascular Surgery) | Admitting: Thoracic Surgery (Cardiothoracic Vascular Surgery)

## 2012-11-11 VITALS — BP 129/92 | HR 103 | Resp 16 | Ht 67.0 in | Wt 148.0 lb

## 2012-11-11 DIAGNOSIS — R918 Other nonspecific abnormal finding of lung field: Secondary | ICD-10-CM

## 2012-11-11 DIAGNOSIS — Z09 Encounter for follow-up examination after completed treatment for conditions other than malignant neoplasm: Secondary | ICD-10-CM | POA: Insufficient documentation

## 2012-11-11 NOTE — Progress Notes (Signed)
      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  Postoperatively the patient has done quite well.  Final pathology on the lung biopsy after outside review by Dr. Clovia Cuff was only descriptive and characterized as "multiple areas of parenchymal scarring, mainly beneath the pleura."   No other specific diagnosis could be made.  There were no signs of microscopic thromboemboli nor other evidence for vasculitis. There were no signs of granulomatous inflammation to suggest either atypical infection or Wegener's granulomatosis. There was no signs of eosinophilic pneumonia.  Subsequently the pathologic findings are felt most likely to be related to the patient's underlying collagen vascular disease which has not been clearly defined.  The patient states that since hospital discharge she has done quite well. She still has persistent dry nonproductive cough which has been present for several months. She has had no pain related to her recent surgery. She has not had any fevers or chills. She denies any shortness of breath. She has been seen in followup by Dr. [name] and she now has made plans to be seen by Dr. Maurine Minister Ang from the division of rheumatology at [facility].    Current Outpatient Prescriptions  Medication Sig Dispense Refill  . fluconazole (DIFLUCAN) 100 MG tablet Take 2 tabs on day#1, then 1 tab daily on Days #2-10  11 tablet  1  . metoprolol tartrate (LOPRESSOR) 25 MG tablet Take 1 tablet (25 mg total) by mouth 2 (two) times daily.  60 tablet  0  . Olmesartan-Amlodipine-HCTZ 40-10-25 MG TABS Take 1  tablet by mouth daily.  90 tablet  3  . predniSONE (DELTASONE) 20 MG tablet Take 2 tablets (40 mg total) by mouth daily with breakfast.  60 tablet  0  . valACYclovir (VALTREX) 500 MG tablet Take 500 mg by mouth daily as needed.       No current facility-administered medications for this visit.      Physical Exam:   BP 129/92  Pulse 103  Resp 16  Ht 5\' 7"  (1.702 m)  Wt 148 lb (67.132 kg)  BMI 23.17 kg/m2  SpO2 100%  General:  Well-appearing  Chest:   Clear to auscultation with symmetrical breath sounds  CV:   Regular rate and rhythm  Incisions:  Healing nicely  Abdomen:  Soft nontender  Extremities:  Warm and well-perfused  Diagnostic Tests:  n/a   Impression:  The patient has recovered uneventfully from her recent lung biopsy. Unfortunately, final pathology was not definitively diagnostic.  Plan:  We will send the patient for routine chest x-ray today. She will call and return to see [place] here in the future as needed.   Salvatore Decent. [name], MD 11/11/2012 4:21 PM

## 2012-11-11 NOTE — Patient Instructions (Signed)
The patient may return to driving an automobile as long as they are no longer requiring oral narcotic pain relievers during the daytime.  It would be wise to start driving only short distances during the daylight and gradually increase from there as they feel comfortable.  Patient may resume normal physical activity without any particular limitations at this time, and return to our office only as needed should any further problems or questions arise.

## 2012-11-15 DIAGNOSIS — Z87898 Personal history of other specified conditions: Secondary | ICD-10-CM | POA: Insufficient documentation

## 2012-11-15 DIAGNOSIS — M21612 Bunion of left foot: Secondary | ICD-10-CM | POA: Insufficient documentation

## 2012-11-15 LAB — FUNGUS CULTURE W SMEAR: Fungal Smear: NONE SEEN

## 2012-11-19 ENCOUNTER — Ambulatory Visit
Admission: RE | Admit: 2012-11-19 | Discharge: 2012-11-19 | Disposition: A | Discharge status: Home / Self Care | Payer: 59 | Source: Ambulatory Visit | Attending: Obstetrics and Gynecology | Admitting: Obstetrics and Gynecology

## 2012-11-19 DIAGNOSIS — R921 Mammographic calcification found on diagnostic imaging of breast: Secondary | ICD-10-CM

## 2012-11-20 ENCOUNTER — Other Ambulatory Visit: Payer: Self-pay | Admitting: *Deleted

## 2012-12-01 LAB — AFB CULTURE WITH SMEAR (NOT AT ARMC)
Acid Fast Smear: NONE SEEN
Acid Fast Smear: NONE SEEN

## 2012-12-03 ENCOUNTER — Telehealth: Payer: Self-pay | Admitting: Internal Medicine

## 2012-12-03 ENCOUNTER — Ambulatory Visit (INDEPENDENT_AMBULATORY_CARE_PROVIDER_SITE_OTHER): Payer: 59 | Admitting: Internal Medicine

## 2012-12-03 ENCOUNTER — Ambulatory Visit (INDEPENDENT_AMBULATORY_CARE_PROVIDER_SITE_OTHER)
Admission: RE | Admit: 2012-12-03 | Discharge: 2012-12-03 | Disposition: A | Discharge status: Home / Self Care | Payer: 59 | Source: Ambulatory Visit | Attending: Internal Medicine | Admitting: Internal Medicine

## 2012-12-03 ENCOUNTER — Encounter: Payer: Self-pay | Admitting: Internal Medicine

## 2012-12-03 VITALS — BP 120/90 | HR 103 | Temp 98.2°F | Ht 67.0 in | Wt 151.0 lb

## 2012-12-03 DIAGNOSIS — R918 Other nonspecific abnormal finding of lung field: Secondary | ICD-10-CM

## 2012-12-03 DIAGNOSIS — R05 Cough: Secondary | ICD-10-CM

## 2012-12-03 DIAGNOSIS — R059 Cough, unspecified: Secondary | ICD-10-CM

## 2012-12-03 MED ORDER — FAMOTIDINE 20 MG PO TABS: ORAL_TABLET | ORAL | Status: DC

## 2012-12-03 MED ORDER — OXYCODONE-ACETAMINOPHEN 7.5-325 MG PO TABS: 1.0000 | ORAL_TABLET | ORAL | Status: DC | PRN

## 2012-12-03 MED ORDER — PANTOPRAZOLE SODIUM 40 MG PO TBEC: 40.0000 mg | DELAYED_RELEASE_TABLET | Freq: Every day | ORAL | Status: DC

## 2012-12-03 NOTE — Patient Instructions (Addendum)
Take delsym two tsp every 12 hours and supplement if needed with  Percocet 1  every 4 hours to suppress the urge to cough. Swallowing water or using ice chips/non mint and menthol containing candies (such as lifesavers or sugarless jolly ranchers) are also effective.  You should rest your voice and avoid activities that you know make you cough.  Once you have eliminated the cough for 3 straight days percocet ,  then the delsym as tolerated.    Please remember to go to the  x-ray department downstairs for your tests - we will call you with the results when they are available.  Pantoprazole (protonix) 40 mg   Take 30-60 min before first meal of the day and Pepcid 20 mg one bedtime until return to office - this is the best way to tell whether stomach acid is contributing to your problem.    Please schedule a follow up office visit in 6 weeks, call sooner if needed

## 2012-12-03 NOTE — Progress Notes (Signed)
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 Pna s/p vats bx 10/18/12 for nodular lung dz ? Etiology with no specific dx per Katzentstein    History of Present Illness  03/08/2012 1st pulmonary eval cc new onset abrupt dry cough and sob 02/27/11  in setting of polyarthralgia x 6 weeks already on prednisone and was being tapered down because arthritis was getting some better  when resp symptoms flare and no better with symbicort.  Started on augmentin 1/23 with chills 1/19  And temp to 101 nothing since then.   rec GERD diet Use tussionex for cough Finish antibiotics   04/04/2012 f/u ov/Nirvana [name] cc much better, no fever, cough some better but still very hoarse and looses breath when talking but otherwise feeling fine, finished pred x one week with no flare of cough, doe or arthritis rec Stop micardis Start bystolic 5 mg twice daily If  cough worse:  Delsym cough syrup Try prilosec 20mg   Take 30-60 min before first meal of the day and Pepcid 20 mg one bedtime until cough and hoarsenss is completely gone for at least a week without the need for cough suppression Pulmonary is as needed if cough not improving or breathing worse   Arthritis assoc with rash, better with prednisone, Dr Kellie Simmering thought it was RA with rec mtx , then abrupt R CP > admit with MPN's and bx 9/5/014 on L > no dx per LKatzenstein > rx 40mg  per day > referred for [facility] eval in Rheumatology     10/28/2012 post hosp f/u re ? ILD has not been seen at [facility], still on 20 mg per day Chief Complaint  Patient presents with  . Hospitalization Follow-up    States feels better since leaving the hospital but is not at 100%.  cough is variable, mostly dry, worse when bend over,   R pleuritic cp much better, rash and  arthritis improving but mostly in shoulders and hips, sparing hands and ankles and feet. rec Keep Wake appt to evaluated arthrititis and rash Stay on 40 mg prednisone daily until seen > tapered to 10 mg per days     12/03/2012 f/u ov/[name] re: cough/ nodular ILD /arthritis now followed at [facility] > tapered pred to 10/day Chief Complaint  Patient presents with  . Follow-up    Pt c/o cough progressively worse since last visit. She states cough seems worse with talking.  Cough is non prod.       No obvious daytime variabilty or assoc purulent sputum or cp or chest tightness, subjective wheeze overt sinus or hb symptoms. No unusual exp hx or h/o childhood pna/ asthma or premature birth to her knowledge.   Sleeping ok without nocturnal  or early am exacerbation  of respiratory  c/o's or need for noct saba. Also denies any obvious fluctuation of symptoms with weather or environmental changes or other aggravating or alleviating factors except as outlined above.  Current Medications, Allergies, Complete Past Medical History, Past Surgical History, Family History, and Social History were reviewed in [employer] record.  ROS  The following are not active complaints unless bolded sore throat, dysphagia, dental problems, itching, sneezing,  nasal congestion or excess/ purulent secretions, ear ache,   fever,  chills, sweats, unintended wt loss, pleuritic cp or exertional cp, hemoptysis,  orthopnea pnd or leg swelling, presyncope, palpitations, heartburn, abdominal pain, anorexia, nausea, vomiting, diarrhea  or change in bowel or urinary habits, change in stools or urine, dysuria,hematuria,  Rash and  Arthralgias both hands worse on lower dose of prednsione, visual complaints, headache, numbness weakness or ataxia or problems with walking or coordination,  change in mood/affect or memory.              Objective:   Physical Exam  10/28/2012     148   >  12/03/2012  151  Wt  Readings from Last 3 Encounters:  03/08/12 159 lb 12.8 oz (72.485 kg)  03/06/12 162 lb (73.483 kg)  02/13/12 165 lb 4 oz (74.957 kg)    amb bf with dry cough and throat clearing   HEENT: nl dentition, turbinates, and orophanx. Nl external ear canals without cough reflex   NECK :  without JVD/Nodes/TM/ nl carotid upstrokes bilaterally   LUNGS: no acc muscle use, clear to A and P bilaterally without cough on insp or exp maneuvers   CV:  RRR  no s3 or murmur or increase in P2, no edema   ABD:  soft and nontender with nl excursion in the supine position. No bruits or organomegaly, bowel sounds nl  MS:  warm without deformities, calf tenderness, cyanosis or clubbing  SKIN: warm and dry with punctate hyperpigmented ulcerations both hands  NEURO:  alert, approp, no deficits      . CXR  12/03/2012 :  Grossly unchanged extensive ill-defined nodularity throughout both lungs, somewhat cavitary on prior CT. Recent biopsy was apparently inconclusive. No acute findings identified        Assessment & Plan:

## 2012-12-03 NOTE — Telephone Encounter (Signed)
RX was sent today after her visit. I called and made pt aware. She will check with her pharmacy. Will sign off message

## 2012-12-03 NOTE — Assessment & Plan Note (Signed)
Not typical of ild as occurs bending over and talking typical of upper airway/ cyclical cough  rec max gerd rx and cyclical cough regimen, reviewed

## 2012-12-03 NOTE — Assessment & Plan Note (Signed)
-   VATs Bx 10/18/2012 > non specific per Adrienne Mocha  No worse as prednisone tapered though the cough seems if anything worse > rec rx symptomatically and await rx of underlying ? Connective tissue dz per rheum derm  See instructions for specific recommendations which were reviewed directly with the patient who was given a copy with highlighter outlining the key components.

## 2012-12-04 ENCOUNTER — Other Ambulatory Visit: Payer: Self-pay | Admitting: *Deleted

## 2012-12-04 MED ORDER — METOPROLOL TARTRATE 25 MG PO TABS: 25.0000 mg | ORAL_TABLET | Freq: Two times a day (BID) | ORAL | Status: DC

## 2012-12-04 NOTE — Progress Notes (Signed)
Quick Note:  Advised pt of CXR results per MW. Pt verbalized understanding and has no further questions at this time ______ 

## 2012-12-10 ENCOUNTER — Observation Stay (HOSPITAL_COMMUNITY)
Admission: EM | Admit: 2012-12-10 | Discharge: 2012-12-11 | Disposition: A | Discharge status: Home / Self Care | Payer: 59 | Attending: Pulmonary Disease | Admitting: Pulmonary Disease

## 2012-12-10 ENCOUNTER — Inpatient Hospital Stay (HOSPITAL_COMMUNITY): Payer: 59

## 2012-12-10 ENCOUNTER — Other Ambulatory Visit (HOSPITAL_COMMUNITY): Payer: Self-pay | Admitting: Internal Medicine

## 2012-12-10 ENCOUNTER — Encounter (HOSPITAL_COMMUNITY): Payer: Self-pay | Admitting: Emergency Medicine

## 2012-12-10 ENCOUNTER — Ambulatory Visit (HOSPITAL_COMMUNITY)
Admission: RE | Admit: 2012-12-10 | Discharge: 2012-12-10 | Disposition: A | Discharge status: Home / Self Care | Payer: 59 | Source: Ambulatory Visit | Attending: Internal Medicine | Admitting: Internal Medicine

## 2012-12-10 DIAGNOSIS — J982 Interstitial emphysema: Secondary | ICD-10-CM | POA: Diagnosis present

## 2012-12-10 DIAGNOSIS — IMO0002 Reserved for concepts with insufficient information to code with codable children: Secondary | ICD-10-CM | POA: Insufficient documentation

## 2012-12-10 DIAGNOSIS — Z79899 Other long term (current) drug therapy: Secondary | ICD-10-CM | POA: Insufficient documentation

## 2012-12-10 DIAGNOSIS — L988 Other specified disorders of the skin and subcutaneous tissue: Secondary | ICD-10-CM

## 2012-12-10 DIAGNOSIS — L723 Sebaceous cyst: Secondary | ICD-10-CM

## 2012-12-10 DIAGNOSIS — M069 Rheumatoid arthritis, unspecified: Secondary | ICD-10-CM | POA: Insufficient documentation

## 2012-12-10 DIAGNOSIS — R05 Cough: Secondary | ICD-10-CM | POA: Insufficient documentation

## 2012-12-10 DIAGNOSIS — L568 Other specified acute skin changes due to ultraviolet radiation: Secondary | ICD-10-CM

## 2012-12-10 DIAGNOSIS — I10 Essential (primary) hypertension: Secondary | ICD-10-CM | POA: Insufficient documentation

## 2012-12-10 DIAGNOSIS — L959 Vasculitis limited to the skin, unspecified: Secondary | ICD-10-CM

## 2012-12-10 DIAGNOSIS — K5909 Other constipation: Secondary | ICD-10-CM

## 2012-12-10 DIAGNOSIS — D751 Secondary polycythemia: Secondary | ICD-10-CM

## 2012-12-10 DIAGNOSIS — R21 Rash and other nonspecific skin eruption: Secondary | ICD-10-CM

## 2012-12-10 DIAGNOSIS — R059 Cough, unspecified: Secondary | ICD-10-CM | POA: Insufficient documentation

## 2012-12-10 DIAGNOSIS — M0520 Rheumatoid vasculitis with rheumatoid arthritis of unspecified site: Secondary | ICD-10-CM

## 2012-12-10 DIAGNOSIS — R51 Headache: Secondary | ICD-10-CM

## 2012-12-10 DIAGNOSIS — I498 Other specified cardiac arrhythmias: Secondary | ICD-10-CM | POA: Insufficient documentation

## 2012-12-10 DIAGNOSIS — R918 Other nonspecific abnormal finding of lung field: Secondary | ICD-10-CM | POA: Insufficient documentation

## 2012-12-10 DIAGNOSIS — E041 Nontoxic single thyroid nodule: Secondary | ICD-10-CM

## 2012-12-10 DIAGNOSIS — Z09 Encounter for follow-up examination after completed treatment for conditions other than malignant neoplasm: Secondary | ICD-10-CM

## 2012-12-10 DIAGNOSIS — Z0000 Encounter for general adult medical examination without abnormal findings: Secondary | ICD-10-CM

## 2012-12-10 DIAGNOSIS — B370 Candidal stomatitis: Secondary | ICD-10-CM

## 2012-12-10 DIAGNOSIS — M2550 Pain in unspecified joint: Secondary | ICD-10-CM

## 2012-12-10 DIAGNOSIS — I776 Arteritis, unspecified: Secondary | ICD-10-CM | POA: Insufficient documentation

## 2012-12-10 DIAGNOSIS — H9209 Otalgia, unspecified ear: Secondary | ICD-10-CM

## 2012-12-10 HISTORY — DX: Interstitial emphysema: J98.2

## 2012-12-10 HISTORY — DX: Cardiac arrhythmia, unspecified: I49.9

## 2012-12-10 HISTORY — DX: Pneumonia, unspecified organism: J18.9

## 2012-12-10 MED ORDER — HYDROCHLOROTHIAZIDE 25 MG PO TABS
25.0000 mg | ORAL_TABLET | Freq: Every day | ORAL | Status: DC
  Administered 2012-12-10 – 2012-12-11 (×2): 25 mg via ORAL
  Filled 2012-12-10 (×2): qty 1

## 2012-12-10 MED ORDER — AMLODIPINE BESYLATE 10 MG PO TABS
10.0000 mg | ORAL_TABLET | Freq: Every day | ORAL | Status: DC
  Administered 2012-12-10 – 2012-12-11 (×2): 10 mg via ORAL
  Filled 2012-12-10 (×3): qty 1

## 2012-12-10 MED ORDER — HYDROCOD POLST-CHLORPHEN POLST 10-8 MG/5ML PO LQCR
5.0000 mL | Freq: Two times a day (BID) | ORAL | Status: DC | PRN
  Administered 2012-12-11: 5 mL via ORAL
  Filled 2012-12-10: qty 5

## 2012-12-10 MED ORDER — IOHEXOL 300 MG/ML  SOLN
150.0000 mL | Freq: Once | INTRAMUSCULAR | Status: AC | PRN
  Administered 2012-12-10: 75 mL via ORAL

## 2012-12-10 MED ORDER — PREDNISONE 5 MG PO TABS
5.0000 mg | ORAL_TABLET | Freq: Every day | ORAL | Status: DC
  Administered 2012-12-11: 5 mg via ORAL
  Filled 2012-12-10 (×3): qty 1

## 2012-12-10 MED ORDER — IRBESARTAN 300 MG PO TABS
300.0000 mg | ORAL_TABLET | Freq: Every day | ORAL | Status: DC
  Administered 2012-12-10 – 2012-12-11 (×2): 300 mg via ORAL
  Filled 2012-12-10 (×3): qty 1

## 2012-12-10 MED ORDER — DEXTROMETHORPHAN POLISTIREX 30 MG/5ML PO LQCR
30.0000 mg | Freq: Two times a day (BID) | ORAL | Status: DC | PRN
  Filled 2012-12-10: qty 5

## 2012-12-10 MED ORDER — FAMOTIDINE 20 MG PO TABS: 20.0000 mg | ORAL_TABLET | Freq: Two times a day (BID) | ORAL | Status: DC | PRN

## 2012-12-10 MED ORDER — OLMESARTAN-AMLODIPINE-HCTZ 40-10-25 MG PO TABS: 1.0000 | ORAL_TABLET | Freq: Every day | ORAL | Status: DC

## 2012-12-10 MED ORDER — ACETAMINOPHEN 325 MG PO TABS: 650.0000 mg | ORAL_TABLET | Freq: Four times a day (QID) | ORAL | Status: DC | PRN

## 2012-12-10 MED ORDER — FAMOTIDINE 20 MG PO TABS
20.0000 mg | ORAL_TABLET | Freq: Two times a day (BID) | ORAL | Status: DC
  Administered 2012-12-10 – 2012-12-11 (×2): 20 mg via ORAL
  Filled 2012-12-10 (×3): qty 1

## 2012-12-10 MED ORDER — METOPROLOL TARTRATE 25 MG PO TABS
25.0000 mg | ORAL_TABLET | Freq: Two times a day (BID) | ORAL | Status: DC
  Administered 2012-12-10 – 2012-12-11 (×3): 25 mg via ORAL
  Filled 2012-12-10 (×3): qty 1

## 2012-12-10 NOTE — ED Notes (Addendum)
Pt reports she felt like her heart was racing last night when she went to sleep, sts she has felt like that pretty much the whole morning. sts she has had this in the past but never be evaluated for it.

## 2012-12-10 NOTE — Progress Notes (Signed)
Patient was received from ED. Assessed per flow sheet. Denies chest pain or shortness of breath. Call bell near.Jamie [name]

## 2012-12-10 NOTE — H&P (Signed)
PULMONARY  / CRITICAL CARE MEDICINE  Name: Jamie [name] MRN: [id number] DOB: 03/30/1965    ADMISSION DATE:  12/10/2012  REFERRING MD :  Eber Hong  CHIEF COMPLAINT:  Abnormal CT chest  BRIEF PATIENT DESCRIPTION:  47 yo female with undefined vasculitis with dermatologic involvement, and progressive nodular pulmonary infiltrates was directed to ER after f/u outpt CT chest showed new pneumomediastinum.  She is followed by Dr. [name] in pulmonary and Dr. Maurine Minister Ang with rheumatology at [facility].  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  She has chronic cough.  She was found to have pneumomediastinum on CT chest and advised to come to ER.  She denies sputum, fever, chest pain.  She has chronic tachycardia, but felt her heart going faster than usually last night.  She has chronic hoarseness, but no worse than usually.  She has progressive changes with skin erosions on her hands and Rt foot.  She has been on prednisone, and recently had dose decreased to 5 mg daily.  She had VATS Lt lung biopsy in Sept 2014 which was non-specific as to a diagnosis.  She has previously serology assessment done which has been negative except for report of positive rheumatoid factor.  Most recent serology at [facility] from 11/15/12 was unrevealing.  She also had u/a at [facility] which showed mild increase in protein.  PAST MEDICAL HISTORY :  Past Medical History  Diagnosis Date  . Hypertension   . Multiple thyroid nodules   . History of cold sores   . 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 Photosensitivity 04/17/2012    3/14  new   . Rheumatoid arthritis   . Pulmonary nodules/lesions, multiple 10/13/2012   Past Surgical History  Procedure Laterality Date  . Abdominal hysterectomy    . Video bronchoscopy Left 10/18/2012    Procedure: VIDEO BRONCHOSCOPY;  Surgeon: Purcell Nails, MD;  Location: [facility] OR;  Service: Thoracic;  Laterality: Left;  . Video assisted thoracoscopy Left 10/18/2012    Procedure: VIDEO ASSISTED THORACOSCOPY;  Surgeon: Purcell Nails, MD;  Location: [facility] OR;  Service: Thoracic;  Laterality: Left;  . Lung biopsy Left 10/18/2012    Procedure: LUNG BIOPSY;  Surgeon: Purcell Nails, MD;  Location: [facility] OR;  Service: Thoracic;  Laterality: Left;   Prior to Admission medications   Medication Sig Start Date End Date Taking? Authorizing Provider  famotidine (PEPCID) 20 MG tablet Take 20 mg by mouth 2 (two) times daily as needed for heartburn. One at bedtime 12/03/12  Yes Nyoka Cowden, MD  metoprolol tartrate (LOPRESSOR) 25 MG tablet Take 1 tablet (25 mg total) by mouth 2 (two) times daily. 12/04/12  Yes Georgina Quint Plotnikov, MD  naproxen sodium (ANAPROX) 220 MG tablet Take 220 mg by mouth 2 (two) times daily with a meal.   Yes Historical Provider, MD  Olmesartan-Amlodipine-HCTZ 40-10-25 MG TABS Take 1 tablet by mouth daily. 10/25/12  Yes Georgina Quint Plotnikov, MD  OVER THE COUNTER MEDICATION Take 1 tablet by mouth every 6 (six) hours as needed (arthritis). [employer] brand arthritis pain medication   Yes Historical  Provider, MD  pantoprazole (PROTONIX) 40 MG tablet Take 1 tablet (40 mg total) by mouth daily. Take 30-60 min before first meal of the day 12/03/12  Yes Nyoka Cowden, MD  predniSONE (DELTASONE) 10 MG tablet Take 10 mg by mouth daily.   Yes Historical Provider, MD  valACYclovir (VALTREX) 500 MG tablet Take 500 mg by mouth daily as needed. 01/15/12  Yes Tresa Garter, MD   Allergies  Allergen Reactions  . Benazepril Hcl     REACTION: cough  . Ciprofloxacin     REACTION: Rhabdomyolysis  .  Tramadol     n/v    FAMILY HISTORY:  Family History  Problem Relation Age of Onset  . Hypertension Other   . Hypertension Mother   . Cancer Father 24    prostate ca   SOCIAL HISTORY:  reports that she has never smoked. She has never used smokeless tobacco. She reports that she does not drink alcohol or use illicit drugs.  REVIEW OF SYSTEMS:   Negative except above.  SUBJECTIVE:   VITAL SIGNS: Temp:  [97.4 F (36.3 C)] 97.4 F (36.3 C) (10/28 1047) Pulse Rate:  [118-135] 118 (10/28 1145) Resp:  [17-28] 22 (10/28 1145) BP: (134-155)/(93-97) 134/97 mmHg (10/28 1145) SpO2:  [96 %-99 %] 99 % (10/28 1145) Weight:  [151 lb (68.493 kg)] 151 lb (68.493 kg) (10/28 1047)  PHYSICAL EXAMINATION: General: No distress Neuro:  Alert, normal strength, moves all extremities HEENT:  Pupils reactive, no sinus tenderness, no oral exudate, no LAN Cardiovascular:  Regular, tachycardic, no murmur Lungs:  Scattered rhonchi, normal respiratory excursion, no wheeze Abdomen:  Soft, non tender, normal bowel sounds Musculoskeletal:  No edema Skin:  Erosions/ulcerations over fingers b/l  Ct Chest Wo Contrast  12/10/2012   CLINICAL DATA:  Probable autoimmune disorder. Known pulmonary nodules. Persistent dry cough. Evaluate for interstitial lung disease.  EXAM: CT CHEST WITHOUT CONTRAST  TECHNIQUE: Multidetector CT imaging of the chest was performed following the standard protocol without IV contrast.  COMPARISON:  Chest CT 10/11/2012.  FINDINGS: Mediastinum: Pneumomediastinum tracking into the cervical regions bilaterally. Heart size is normal. There is no significant pericardial fluid, thickening or pericardial calcification. No pathologically enlarged mediastinal or hilar lymph nodes. Please note that accurate exclusion of hilar adenopathy is limited on noncontrast CT scans. Esophagus is unremarkable in appearance.  Lungs/Pleura: Patchy multifocal nodular opacities are again noted throughout the lungs  bilaterally. These are generally increase in number and size compared to the prior study. Specific examples include a 2.4 x 1.5 cm nodule in the anterior aspect of the right lower lobe (image 27 of series 5) which previously measured only 1.5 x 1.0 cm, as well as a peripheral nodule in the right upper lobe (image 23 of series 5) which currently measures 1.4 x 1.1 cm (previously only 7 x 8 mm). Some of these nodules appear partially cavitary, including a cavitary lesion in the periphery of the left lower lobe (image 25 of series 5). There is a slight upper lung predominance and peripheral predominance of these nodules. High-resolution images demonstrate no definite peripheral subpleural reticulation, frank honeycombing or typical traction bronchiectasis to strongly suggest the presence of an underlying interstitial lung disease. Status post wedge resection in the left upper lobe.  Upper Abdomen: Multiple low-attenuation hepatic lesions, similar to the prior study, largest of which measures approximately 3.3 x 2.0 cm in the posterior aspect of segment 7 of the liver. These are incompletely characterized on today's non contrast CT examination.  Musculoskeletal: There are no aggressive appearing lytic or blastic lesions noted in the visualized portions of the skeleton.  IMPRESSION: 1. Pneumomediastinum. No associated pneumothorax. 2. Interval increase in number and size of numerous pulmonary nodules scattered throughout the lungs bilaterally, some of which are cavitary. These have a upper and peripheral lung predominance. The overall appearance is nonspecific, however, given the concern for underlying on of the disease, differential considerations would include entities such as Wegener's granulomatosis and rheumatoid lung. Alternatively, these findings could be seen in the setting of atypical infection, including fungal and mycobacterial etiologies. Clinical correlation is recommended. 3. No definite findings to suggest  underlying interstitial lung disease at this time. Critical Value/emergent results were called by telephone at the time of interpretation on 12/10/2012 at 10:04 AM to Dr.Yasmin, who verbally acknowledged these results.   Electronically Signed   By: Trudie Reed M.D.   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 P: -oxygen as needed to keep SpO2 > 92% -f/u PA/Lateral CXR -defer thoracic surgery evaluation for now -continue 5 mg prednisone daily -f/u esophagram -defer further serology evaluation at this time  A: Hx of sinus tachycardia. HTN. P: -monitor on tele -continue olmesartan, HCTZ, lopressor  A: Pain. P: -prn tylenol  Summary: Will monitor in hospital overnight.  If CXR on 10/29 stable, then possible d/c home in AM.  Coralyn Helling, MD [facility] Pulmonary/Critical Care 12/10/2012, 2:09 PM Pager:  [phone] After 3pm call: [phone]

## 2012-12-10 NOTE — ED Notes (Signed)
Spoke with floor nurse on 2100, sts she spoke with doctor saying pt not needing ICU bed and they will put in new bed assignment.

## 2012-12-10 NOTE — ED Notes (Signed)
Pt reports she went to get a CT of her chest for repeat after seeing some scar tissue in lungs. sts her physician called her and told her to go straight to the ED. Denies pain/sob. Only complaint of dry cough, sts it started months ago. Nad, skin warm and dry, resp e/u.

## 2012-12-10 NOTE — ED Provider Notes (Signed)
CSN: [id number]     Arrival date & time 12/10/12  1038 History   First MD Initiated Contact with Patient 12/10/12 1046     Chief Complaint  Patient presents with  . Follow-up   (Consider location/radiation/quality/duration/timing/severity/associated sxs/prior Treatment) HPI Comments: 47 year old female with a recent history of diagnosis of lung nodules thought to be inflammatory or scar tissue, has had a nonproductive nagging cough for over one month. She has been admitted to the hospital and worked up for this and found no obvious definitive etiologies of these symptoms, she has followed up with her rheumatologist who ordered a repeat CT scan this morning, she was told to come to the emergency department immediately. She admits that on Sunday night she was coughing excessively and felt chest pain as well as neck pain and has had ongoing symptoms since that time though they are mild. No fevers vomiting or swelling  The history is provided by the patient and medical records.    Past Medical History  Diagnosis Date  . Hypertension   . Multiple thyroid nodules   . History of cold sores   . 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 Photosensitivity 04/17/2012    3/14 new   . Rheumatoid arthritis   . Pulmonary nodules/lesions, multiple 10/13/2012   Past Surgical History  Procedure Laterality Date  . Abdominal hysterectomy    . Video bronchoscopy Left 10/18/2012    Procedure: VIDEO BRONCHOSCOPY;  Surgeon: Purcell Nails, MD;  Location: [facility] OR;  Service: Thoracic;  Laterality: Left;  . Video assisted thoracoscopy Left 10/18/2012    Procedure: VIDEO ASSISTED THORACOSCOPY;  Surgeon: Purcell Nails, MD;  Location: [facility] OR;  Service: Thoracic;  Laterality: Left;  . Lung biopsy Left 10/18/2012    Procedure: LUNG BIOPSY;  Surgeon: Purcell Nails, MD;  Location: [facility] OR;  Service: Thoracic;  Laterality: Left;   Family History   Problem Relation Age of Onset  . Hypertension Other   . Hypertension Mother   . Cancer Father 52    prostate ca   History  Substance Use Topics  . Smoking status: Never Smoker   . Smokeless tobacco: Never Used  . Alcohol Use: No   OB History   Grav Para Term Preterm Abortions TAB SAB Ect Mult Living                 Review of Systems  All other systems reviewed and are negative.    Allergies  Benazepril hcl; Ciprofloxacin; and Tramadol  Home Medications   Current Outpatient Rx  Name  Route  Sig  Dispense  Refill  . famotidine (PEPCID) 20 MG tablet      One at bedtime   30 tablet   11   . metoprolol tartrate (LOPRESSOR) 25 MG tablet   Oral   Take 1 tablet (25 mg total) by mouth 2 (two) times daily.   60 tablet   0   . Olmesartan-Amlodipine-HCTZ 40-10-25 MG TABS   Oral   Take 1 tablet by mouth daily.   90 tablet   3   . oxyCODONE-acetaminophen (PERCOCET) 7.5-325 MG per tablet   Oral   Take 1 tablet by mouth every 4 (four) hours as needed for pain.   30 tablet   0   . pantoprazole (PROTONIX) 40 MG tablet   Oral   Take 1 tablet (40 mg total) by mouth  daily. Take 30-60 min before first meal of the day   30 tablet   11   . predniSONE (DELTASONE) 20 MG tablet   Oral   Take 10 mg by mouth daily with breakfast.         . valACYclovir (VALTREX) 500 MG tablet   Oral   Take 500 mg by mouth daily as needed.          BP 155/93  Pulse 129  Temp(Src) 97.4 F (36.3 C) (Oral)  Resp 17  Ht 5\' 7"  (1.702 m)  Wt 151 lb (68.493 kg)  BMI 23.64 kg/m2  SpO2 98% Physical Exam  Nursing note and vitals reviewed. Constitutional: She appears well-developed and well-nourished. No distress.  HENT:  Head: Normocephalic and atraumatic.  Mouth/Throat: Oropharynx is clear and moist. No oropharyngeal exudate.  Eyes: Conjunctivae and EOM are normal. Pupils are equal, round, and reactive to light. Right eye exhibits no discharge. Left eye exhibits no discharge. No  scleral icterus.  Neck: Normal range of motion. Neck supple. No JVD present. No thyromegaly present.  Subcutaneous emphysema present in the neck on palpation  Cardiovascular: Regular rhythm, normal heart sounds and intact distal pulses.  Exam reveals no gallop and no friction rub.   No murmur heard. Tachycardic, no Hamman's crunch  Pulmonary/Chest: Effort normal and breath sounds normal. No respiratory distress. She has no wheezes. She has no rales.  Abdominal: Soft. Bowel sounds are normal. She exhibits no distension and no mass. There is no tenderness.  Musculoskeletal: Normal range of motion. She exhibits no edema and no tenderness.  Lymphadenopathy:    She has no cervical adenopathy.  Neurological: She is alert. Coordination normal.  Skin: Skin is warm and dry. No rash noted. No erythema.  Psychiatric: She has a normal mood and affect. Her behavior is normal.    ED Course  Procedures (including critical care time) Labs Review Labs Reviewed - No data to display Imaging Review Ct Chest Wo Contrast  12/10/2012   CLINICAL DATA:  Probable autoimmune disorder. Known pulmonary nodules. Persistent dry cough. Evaluate for interstitial lung disease.  EXAM: CT CHEST WITHOUT CONTRAST  TECHNIQUE: Multidetector CT imaging of the chest was performed following the standard protocol without IV contrast.  COMPARISON:  Chest CT 10/11/2012.  FINDINGS: Mediastinum: Pneumomediastinum tracking into the cervical regions bilaterally. Heart size is normal. There is no significant pericardial fluid, thickening or pericardial calcification. No pathologically enlarged mediastinal or hilar lymph nodes. Please note that accurate exclusion of hilar adenopathy is limited on noncontrast CT scans. Esophagus is unremarkable in appearance.  Lungs/Pleura: Patchy multifocal nodular opacities are again noted throughout the lungs bilaterally. These are generally increase in number and size compared to the prior study. Specific  examples include a 2.4 x 1.5 cm nodule in the anterior aspect of the right lower lobe (image 27 of series 5) which previously measured only 1.5 x 1.0 cm, as well as a peripheral nodule in the right upper lobe (image 23 of series 5) which currently measures 1.4 x 1.1 cm (previously only 7 x 8 mm). Some of these nodules appear partially cavitary, including a cavitary lesion in the periphery of the left lower lobe (image 25 of series 5). There is a slight upper lung predominance and peripheral predominance of these nodules. High-resolution images demonstrate no definite peripheral subpleural reticulation, frank honeycombing or typical traction bronchiectasis to strongly suggest the presence of an underlying interstitial lung disease. Status post wedge resection in the left upper lobe.  Upper Abdomen: Multiple low-attenuation hepatic lesions, similar to the prior study, largest of which measures approximately 3.3 x 2.0 cm in the posterior aspect of segment 7 of the liver. These are incompletely characterized on today's non contrast CT examination.  Musculoskeletal: There are no aggressive appearing lytic or blastic lesions noted in the visualized portions of the skeleton.  IMPRESSION: 1. Pneumomediastinum. No associated pneumothorax. 2. Interval increase in number and size of numerous pulmonary nodules scattered throughout the lungs bilaterally, some of which are cavitary. These have a upper and peripheral lung predominance. The overall appearance is nonspecific, however, given the concern for underlying on of the disease, differential considerations would include entities such as Wegener's granulomatosis and rheumatoid lung. Alternatively, these findings could be seen in the setting of atypical infection, including fungal and mycobacterial etiologies. Clinical correlation is recommended. 3. No definite findings to suggest underlying interstitial lung disease at this time. Critical Value/emergent results were called by  telephone at the time of interpretation on 12/10/2012 at 10:04 AM to Dr.Yasmin, who verbally acknowledged these results.   Electronically Signed   By: Trudie Reed M.D.   On: 12/10/2012 10:05    EKG Interpretation   None       MDM   1. Pneumomediastinum   2. Pulmonary nodules    I have reviewed the CT scan with the patient, she is tachycardic, (probably from anxiety but unsure of the exact etiology. Her CT scan is grossly abnormal and has worsened in the last month, will need to discuss this with pulmonology. Again the patient has been seen by Dr. [name] with pulmonology as well as by Dr. [name] who performed the biopsy.  Discussed with Dr. Molli Knock, who will see the patient in consultation probably for admission. The patient persistently tachycardic, she is not hypoxic, I've reviewed the CT scan findings with the patient, the patient's rheumatologist as well as the pulmonary critical care physician.    Vida Roller, MD 12/10/12 1153

## 2012-12-10 NOTE — ED Notes (Signed)
Pt returned from radiology.

## 2012-12-10 NOTE — ED Notes (Signed)
Pt placed on 2L [place]

## 2012-12-11 ENCOUNTER — Inpatient Hospital Stay (HOSPITAL_COMMUNITY): Payer: 59

## 2012-12-11 ENCOUNTER — Other Ambulatory Visit: Payer: Self-pay

## 2012-12-11 DIAGNOSIS — I379 Nonrheumatic pulmonary valve disorder, unspecified: Secondary | ICD-10-CM

## 2012-12-11 MED ORDER — HYDROCOD POLST-CHLORPHEN POLST 10-8 MG/5ML PO LQCR: 5.0000 mL | Freq: Two times a day (BID) | ORAL | Status: DC | PRN

## 2012-12-11 MED ORDER — PREDNISONE 10 MG PO TABS: 5.0000 mg | ORAL_TABLET | Freq: Every day | ORAL | Status: DC

## 2012-12-11 MED ORDER — OLMESARTAN-AMLODIPINE-HCTZ 20-5-12.5 MG PO TABS: 1.0000 | ORAL_TABLET | Freq: Every day | ORAL | Status: DC

## 2012-12-11 NOTE — Progress Notes (Signed)
*  PRELIMINARY RESULTS* Echocardiogram 2D Echocardiogram has been performed.  Jamie [name] 12/11/2012, 2:32 PM

## 2012-12-11 NOTE — Discharge Summary (Signed)
Physician Discharge Summary  Patient ID: Jamie [name] MRN: [id number] DOB/AGE: [birth year] 47 y.o.  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  Cough  Discharge Plan: -continue 5 mg prednisone daily  -follow up with Rhe[facility]ology at [facility] as scheduled -tussinex PRN for cough  -patient instructed on changes in breathing, chest pain, increased subcutaneous air to report immediately to ER.   -f/u cxr in office with Dr. [name] as below   Hx of sinus tachycardia.  HTN.   Discharge Plan: -continue lopressor -continue Tribenzor at reduced dose (low - normotensive as inpatient), can be reevaluated as outpatient -assess ECHO, EKG prior to discharge -follow up with Dr. Melburn Popper for review of HTN, tachycardia in setting of vasculitis  Pain.  Discharge Plan: -prn tylenol / ibuprofen                   DISCHARGE SUMMARY   Jamie [name] is a 47 y.o. y/o female with a PMH of undefined vasculitis of skin and progressive nodular pulmonary infiltrates who had f/u outpt CT chest scheduled. She has chronic cough. She was found to have pneumomediastinum on CT chest and advised to come to ER. She denied sputum, fever, chest pain. Noted she coughed the day prior to admit with pain in her upper chest / neck that quickly resolved. She has chronic tachycardia, but felt her heart was going faster than usual prior to admit. She has chronic hoarseness, but no worse than usually. She has progressive changes with skin erosions on her hands and Rt foot. She has been on prednisone, and recently had dose decreased to 5 mg daily. She had VATS Lt lung biopsy in Sept  2014 which was non-specific as to a diagnosis. She has previously serology assessment done which has been negative except for report of positive rheumatoid factor. Most recent serology at [facility] from 11/15/12 was unrevealing. She also had u/a at [facility] which showed mild increase in protein. She is followed by Dr. [name] in pulmonary and Dr. Maurine Minister Ang with rheumatology at [facility].  Patient was admitted for observation overnight.  She underwent an esophagram to evaluate for changes consistent with scleroderma and was found to be grossly normal esophageal caliber with no evidence of esophageal perforation / explanation for pneumomediastinum.    Currently, she is pending ECHO & EKG to further evaluate patient in the setting of tachycardia & unspecified vasculitis.  She will follow up with Cardiology as an outpatient for review.    SIGNIFICANT DIAGNOSTIC STUDIES 10/28 CT chest >> pneumomediastinum, patchy multifocal nodular opacities increased in size with some cavitation  10/29 Esophagram >>grossly normal esophageal caliber with no evidence of esophageal perforation / cause of pneumomediastinum. 10/29 ECHO>>> 10/29 EKG>>>   Discharge Exam: General: No distress  Neuro: Alert, normal strength, moves all extremities  HEENT: Pupils reactive, no sinus tenderness, no oral exudate, no LAN  Cardiovascular: Regular,  tachycardic, no murmur  Lungs: Scattered rhonchi, normal respiratory excursion, no wheeze  Abdomen: Soft, non tender, normal bowel sounds  Musculoskeletal: No edema  Skin: Erosions/ulcerations over fingers b/l   Filed Vitals:   12/10/12 2013 12/11/12 0428 12/11/12 0924 12/11/12 1042  BP: 124/90 114/75 116/78 114/75  Pulse: 107 100 97 96  Temp: 98.3 F (36.8 C) 98.1 F (36.7 C) 99.1 F (37.3 C)   TempSrc: Oral Oral Oral   Resp: 18 18    Height:      Weight:      SpO2: 98% 96% 96%      Future Appointments Provider Department Dept Phone   12/18/2012 10:30 AM Nyoka Cowden, MD  [facility] [phone]   12/25/2012 9:45 AM Vesta Mixer, MD [facility] [place] Office [phone]   01/14/2013 4:30 PM Nyoka Cowden, MD  [facility] [phone]          Follow-up Information   Follow up with Sandrea Hughs, MD On 01/14/2013. (Appt at 4:30)    Specialty:  Pulmonary Disease   Contact information:   [address] [place] [address] [phone]       Follow up with Elyn Aquas., MD On 12/25/2012. (Appt at 9:45 am.  Arrive at 9:30.  Cardiology. )    Specialty:  Cardiology   Contact information:   [address] [place] [address] [phone]       Follow up with Sandrea Hughs, MD On 12/18/2012. (Appt at 10:30 AM.  Chest Xray before appointment. )    Specialty:  Pulmonary Disease   Contact information:   [address] [place] [address] [phone]          Medication List    STOP taking these medications       Olmesartan-Amlodipine-HCTZ 40-10-25 MG Tabs  Replaced by:  Olmesartan-Amlodipine-HCTZ 20-5-12.5 MG Tabs      TAKE these medications       chlorpheniramine-HYDROcodone 10-8 MG/5ML Lqcr  Commonly known as:  TUSSIONEX  Take 5 mLs by mouth every 12 (twelve) hours as needed.     famotidine 20 MG tablet  Commonly known as:  PEPCID  Take 20 mg by mouth 2 (two) times daily as needed for heartburn. One at bedtime     metoprolol tartrate 25 MG tablet  Commonly known as:  LOPRESSOR  Take 1 tablet (25 mg total) by mouth 2 (two) times daily.     naproxen sodium 220 MG tablet  Commonly known as:  ANAPROX  Take 220 mg by mouth 2 (two) times daily with a meal.     Olmesartan-Amlodipine-HCTZ 20-5-12.5 MG Tabs  Commonly known as:  TRIBENZOR  Take 1 tablet by mouth daily.     OVER THE COUNTER MEDICATION  Take 1 tablet by mouth every 6 (six) hours as needed (arthritis). [employer] brand arthritis pain medication     pantoprazole 40 MG tablet  Commonly known as:  PROTONIX  Take 1  tablet (40 mg total) by mouth daily. Take 30-60 min before first meal of the day     predniSONE 10 MG tablet  Commonly known as:  DELTASONE  Take 0.5 tablets (5 mg total) by mouth daily.     valACYclovir 500 MG tablet  Commonly known as:  VALTREX  Take 500 mg by mouth daily as needed.          Disposition: Home.    Discharged Condition: Jamie [name] has met maximum  benefit of inpatient care and is medically stable and cleared for discharge.  Patient is pending follow up as above.      Time spent on disposition:  Greater than 35 minutes.   Signed: Canary Brim, NP-C [facility] Pgr: [phone] Office: [phone]    Agree  Billy Fischer, MD ; [facility] [place] service Mobile [phone].  After 5:30 PM or weekends, call [phone]

## 2012-12-11 NOTE — Care Management Note (Signed)
    Page 1 of 1   12/11/2012     4:32:51 PM   CARE MANAGEMENT NOTE 12/11/2012  Patient:  Jamie [name], Jamie [name]   Account Number:  [id number]  Date Initiated:  12/11/2012  Documentation initiated by:  Beckem Tomberlin  Subjective/Objective Assessment:   PT ADM ON 12/10/12 WITH PNEUMOMEDIASTINUM.  PTA, PT INDEPENDENT, LIVES WITH SPOUSE.     Action/Plan:   WILL FOLLOW FOR DISCHARGE NEEDS AS PT PROGRESSES.   Anticipated DC Date:  12/11/2012   Anticipated DC Plan:  HOME/SELF CARE      DC Planning Services  CM consult      Choice offered to / List presented to:             Status of service:  Completed, signed off Medicare Important Message given?   (If response is "NO", the following Medicare IM given date fields will be blank) Date Medicare IM given:   Date Additional Medicare IM given:    Discharge Disposition:  HOME/SELF CARE  Per UR Regulation:  Reviewed for med. necessity/level of care/duration of stay  If discussed at Long Length of Stay Meetings, dates discussed:    Comments:

## 2012-12-17 ENCOUNTER — Ambulatory Visit (INDEPENDENT_AMBULATORY_CARE_PROVIDER_SITE_OTHER): Payer: 59 | Admitting: Cardiovascular Disease

## 2012-12-17 ENCOUNTER — Encounter: Payer: Self-pay | Admitting: Cardiovascular Disease

## 2012-12-17 VITALS — BP 140/88 | HR 100 | Ht 67.0 in | Wt 147.0 lb

## 2012-12-17 DIAGNOSIS — R000 Tachycardia, unspecified: Secondary | ICD-10-CM

## 2012-12-17 DIAGNOSIS — I498 Other specified cardiac arrhythmias: Secondary | ICD-10-CM

## 2012-12-17 NOTE — Progress Notes (Signed)
History of Present Illness: 47 yo female with undefined vasculitis with dermatologic involvement, and progressive nodular pulmonary infiltrates who is here today for evaluation of tachycardia. First diagnosed with pneumonia in January 2014 and then with further testing was found to have lung nodules.Recent lung biopsy. She was admitted to [facility] 12/10/12 with pneumomediastinum and managed by Pulmonary. She was noted to be tachycardic. Echo and EKG arranged with plans for "follow up results of testing in cardiology". She is followed by Dr. [name] in pulmonary and Dr. Maurine Minister Ang with rheumatology at [facility]. EKG from 12/11/11 with NSR, rate 91 bpm, non-specific T wave abnormalities. Echo 12/11/12 with normal LV size and function, mild LVH, no significant valve abnormalities.  She has been on Prednisone since January 2014.   She tells me today that she is dyspneic with walking up stairs. She if very fatigued at night. She occasionally feels her heart racing. No palpitations. No dizziness, near syncope or syncope. No chest pain.   Primary Care Physician: Plotnikov  Last Lipid Profile:Lipid Panel     Component Value Date/Time   CHOL 152 10/27/2010 0734   TRIG 35.0 10/27/2010 0734   HDL 50.80 10/27/2010 0734   CHOLHDL 3 10/27/2010 0734   VLDL 7.0 10/27/2010 0734   LDLCALC 94 10/27/2010 0734     Past Medical History  Diagnosis Date  . Hypertension   . Multiple thyroid nodules   . History of cold sores   . 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 Photosensitivity 04/17/2012    3/14 new   . Pulmonary nodules/lesions, multiple 10/13/2012  . 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 Dysrhythmia     ST/notes 12/10/2012  . Pneumonia 02/2012    "once" (12/10/2012)  . 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 Rheumatoid arthritis     "legs; arms" (12/10/2012)      Past Surgical History  Procedure Laterality Date  . Video bronchoscopy Left 10/18/2012    Procedure: VIDEO BRONCHOSCOPY;  Surgeon: Purcell Nails, MD;  Location: [facility] OR;  Service: Thoracic;  Laterality: Left;  . Video assisted thoracoscopy Left 10/18/2012    Procedure: VIDEO ASSISTED THORACOSCOPY;  Surgeon: Purcell Nails, MD;  Location: [facility] OR;  Service: Thoracic;  Laterality: Left;  . Lung biopsy Left 10/18/2012    Procedure: LUNG BIOPSY;  Surgeon: Purcell Nails, MD;  Location: [facility] OR;  Service: Thoracic;  Laterality: Left;  . Abdominal hysterectomy  ~ 2006    Current Outpatient Prescriptions  Medication Sig Dispense Refill  . chlorpheniramine-HYDROcodone (TUSSIONEX) 10-8 MG/5ML LQCR Take 5 mLs by mouth every 12 (twelve) hours as needed.  115 mL  0  . famotidine (PEPCID) 20 MG tablet Take 20 mg by mouth 2 (two) times daily as needed for heartburn. One at bedtime      . metoprolol tartrate (LOPRESSOR) 25 MG tablet Take 1 tablet (25 mg total) by mouth 2 (two) times daily.  60 tablet  0  . naproxen sodium (ANAPROX) 220 MG tablet Take 220 mg by mouth 2 (two) times daily with a meal.      . Olmesartan-Amlodipine-HCTZ (TRIBENZOR) 20-5-12.5 MG TABS Take 1 tablet by mouth daily.  30 tablet  3  . OVER THE COUNTER MEDICATION Take 1 tablet by mouth every 6 (six) hours as needed (arthritis). [employer] brand arthritis pain medication      .  pantoprazole (PROTONIX) 40 MG tablet Take 1 tablet (40 mg total) by mouth daily. Take 30-60 min before first meal of the day  30 tablet  11  . predniSONE (DELTASONE) 10 MG tablet Take 0.5 tablets (5 mg total) by mouth daily.      . valACYclovir (VALTREX) 500 MG tablet Take 500 mg by mouth daily as needed.       No current facility-administered medications for this visit.    Allergies  Allergen Reactions  . Benazepril Hcl     REACTION: cough  . Tramadol     n/v  . Ciprofloxacin Rash    Joint pain REACTION: Rhabdomyolysis    History   Social History  .  Marital Status: Married    Spouse Name: N/A    Number of Children: 1  . Years of Education: N/A   Occupational History  . Admitting at [employer] [facility] [place]   Social History Main Topics  . Smoking status: Never Smoker   . Smokeless tobacco: Never Used  . Alcohol Use: No  . Drug Use: No  . Sexual Activity: Yes   Other Topics Concern  . Not on file   Social History Narrative  . No narrative on file    Family History  Problem Relation Age of Onset  . Hypertension Other   . Hypertension Mother   . Cancer Father 100    prostate ca    Review of Systems:  As stated in the HPI and otherwise negative.   BP 140/88  Pulse 100  Ht 5\' 7"  (1.702 m)  Wt 147 lb (66.679 kg)  BMI 23.02 kg/m2  Physical Examination: General: Well developed, well nourished, NAD HEENT: OP clear, mucus membranes moist SKIN: warm, dry. No rashes. Neuro: No focal deficits Musculoskeletal: Muscle strength 5/5 all ext Psychiatric: Mood and affect normal Neck: No JVD, no carotid bruits, no thyromegaly, no lymphadenopathy. Lungs:Clear bilaterally, no wheezes, rhonci, crackles Cardiovascular: Regular rate and rhythm. No murmurs, gallops or rubs. Abdomen:Soft. Bowel sounds present. Non-tender.  Extremities: No lower extremity edema. Pulses are 2 + in the bilateral DP/PT.  EKG: Sinus tach, rate 111 bpm.   Echo 12/11/12: Left ventricle: The cavity size was normal. Wall thickness was increased in a pattern of mild LVH. Systolic function was normal. The estimated ejection fraction was in the range of 50% to 55%. Wall motion was normal; there were no regional wall motion abnormalities. Doppler parameters are consistent with abnormal left ventricular relaxation (grade 1 diastolic dysfunction).   Assessment and Plan:   1. Sinus Tachycardia: She has normal LV function with no evidence of pericardial effusion. No evidence of PE on CTA August 2014. Recent normal thyroid studies. Her sinus tachycardia is likely a  normal response to some other metabolic abnormality. She is also on steroids. No further cardiac testing. Titrate metoprolol to 50 mg po BID if tachycardia persists.

## 2012-12-17 NOTE — Patient Instructions (Signed)
Your physician recommends that you schedule a follow-up appointment  As needed with Dr. McAlhany  

## 2012-12-18 ENCOUNTER — Ambulatory Visit (INDEPENDENT_AMBULATORY_CARE_PROVIDER_SITE_OTHER): Payer: 59 | Admitting: Internal Medicine

## 2012-12-18 ENCOUNTER — Encounter: Payer: Self-pay | Admitting: Internal Medicine

## 2012-12-18 VITALS — BP 136/82 | HR 116 | Ht 67.0 in | Wt 147.4 lb

## 2012-12-18 DIAGNOSIS — J982 Interstitial emphysema: Secondary | ICD-10-CM

## 2012-12-18 DIAGNOSIS — R918 Other nonspecific abnormal finding of lung field: Secondary | ICD-10-CM

## 2012-12-18 MED ORDER — HYDROCOD POLST-CHLORPHEN POLST 10-8 MG/5ML PO LQCR: 5.0000 mL | Freq: Two times a day (BID) | ORAL | Status: DC | PRN

## 2012-12-18 NOTE — Patient Instructions (Addendum)
tussionex one half tsp in am and full tsp  at bedtime  We may need to consider an open biopsy on one of the cavitary nodules on the Right side if your rheumatologist is not able to shed any light on why you have them   Please schedule a follow up office visit in 2 weeks, sooner if needed with cxr on return  Late add : consider [facility] second opinion

## 2012-12-18 NOTE — Progress Notes (Signed)
Subjective:    Patient ID: Jamie [name], female    DOB: April 12, 1965   MRN: [id number]    Brief patient profile:  28 yobf never smoker works Pharmacist, hospital at [employer] [facility] new onset respiratory problems just starting Jan 2014 referred by Dr Alain Marion for eval of ? Pna s/p vats bx 10/18/12 for nodular lung dz ? Etiology with no specific dx per [facility]    History of Present Illness  03/08/2012 1st pulmonary eval cc new onset abrupt dry cough and sob 02/27/11  in setting of polyarthralgia x 6 weeks already on prednisone and was being tapered down because arthritis was getting some better  when resp symptoms flare and no better with symbicort.  Started on augmentin 1/23 with chills 1/19  And temp to 101 nothing since then.   rec GERD diet Use tussionex for cough Finish antibiotics   04/04/2012 f/u ov/Jamie [name] cc much better, no fever, cough some better but still very hoarse and looses breath when talking but otherwise feeling fine, finished pred x one week with no flare of cough, doe or arthritis rec Stop micardis Start bystolic 5 mg twice daily If  cough worse:  Delsym cough syrup Try prilosec 20mg   Take 30-60 min before first meal of the day and Pepcid 20 mg one bedtime until cough and hoarsenss is completely gone for at least a week without the need for cough suppression Pulmonary is as needed if cough not improving or breathing worse   Arthritis assoc with rash, better with prednisone, Dr Charlestine Night thought it was RA with rec mtx , then abrupt R CP > admit with MPN's and bx 9/5/014 on L > no dx per LKatzenstein > rx 40mg  per day > referred for [facility] eval in Rheumatology     10/28/2012 post hosp f/u re ? ILD has not been seen at [facility], still on 20 mg per day Chief Complaint  Patient presents with  . Hospitalization Follow-up    States feels better since leaving the hospital but is not at 100%.  cough is variable, mostly dry, worse when bend over,   R pleuritic cp much better, rash and  arthritis improving but mostly in shoulders and hips, sparing hands and ankles and feet. rec Keep Wake appt to evaluated arthrititis and rash Stay on 40 mg prednisone daily until seen > tapered to 10 mg per days     12/03/2012 f/u ov/Jamie [name] re: cough/ nodular ILD /arthritis now followed at [facility] > tapered pred to 10/day Chief Complaint  Patient presents with  . Follow-up    Pt c/o cough progressively worse since last visit. She states cough seems worse with talking.  Cough is non prod.   rec Take delsym two tsp every 12 hours and supplement if needed with  Percocet 1  every 4 hours to suppress the urge to cough.   Once you have eliminated the cough for 3 straight days percocet ,  then the delsym as tolerated.  Pantoprazole (protonix) 40 mg   Take 30-60 min before first meal of the day and Pepcid 20 mg one bedtime until return to office - this is the best way to tell whether stomach acid is contributing to your problem.    Admit date: 12/10/2012  Discharge date: 12/11/2012  Discharge Diagnoses:  Pneumomediastinum  Progressive Nodular Pulmonary Infiltrates  Chronic Cough  HTN  Sinus Tachycardia  Pain   12/18/2012 f/u ov/Jamie [name] re: PF/ ? Rheumatoid  Chief Complaint  Patient presents with  .  HFU    C/o dry cough, some hoarseness. No increase SOB, no wheezing, no chest tx  pred still at 10 mg daily  Cough even percocet no change  tussionex helping at hs   No obvious daytime variabilty or assoc purulent sputum or cp or chest tightness, subjective wheeze overt sinus or hb symptoms. No unusual exp hx or h/o childhood pna/ asthma or premature birth to her knowledge.   Sleeping ok without nocturnal  or early am exacerbation  of respiratory  c/o's or need for noct saba. Also denies any obvious fluctuation of symptoms with weather or environmental changes or other aggravating or alleviating factors except as outlined above.  Current Medications, Allergies, Complete Past Medical History,  Past Surgical History, Family History, and Social History were reviewed in [employer] record.  ROS  The following are not active complaints unless bolded sore throat, dysphagia, dental problems, itching, sneezing,  nasal congestion or excess/ purulent secretions, ear ache,   fever, chills, sweats, unintended wt loss, pleuritic cp or exertional cp, hemoptysis,  orthopnea pnd or leg swelling, presyncope, palpitations, heartburn, abdominal pain, anorexia, nausea, vomiting, diarrhea  or change in bowel or urinary habits, change in stools or urine, dysuria,hematuria,  Rash and  Arthralgias both hands worse on lower dose of prednsione, visual complaints, headache, numbness weakness or ataxia or problems with walking or coordination,  change in mood/affect or memory.              Objective:   Physical Exam  10/28/2012     148   >  12/03/2012  151 > 12/18/2012  147  Wt Readings from Last 3 Encounters:  03/08/12 159 lb 12.8 oz (72.485 kg)  03/06/12 162 lb (73.483 kg)  02/13/12 165 lb 4 oz (74.957 kg)    amb bf with dry cough and throat clearing   HEENT: nl dentition, turbinates, and orophanx. Nl external ear canals without cough reflex No crepitance in neck    NECK :  without JVD/Nodes/TM/ nl carotid upstrokes bilaterally   LUNGS: no acc muscle use, min insp and exp rhonchi bases   CV:  RRR  no s3 or murmur or increase in P2, no edema - neg Haman's  ABD:  soft and nontender with nl excursion in the supine position. No bruits or organomegaly, bowel sounds nl  MS:  warm without deformities, calf tenderness, cyanosis or clubbing  SKIN: warm and dry with punctate hyperpigmented ulcerations both hands  NEURO:  alert, approp, no deficits      .    cxr 08/11/12  1. Persistent pneumomediastinum with subcutaneous emphysema in the  left neck. No definite pneumothorax.  2. Persistent mixed interstitial opacification and airspace  nodularity bilaterally, better  evaluated on yesterday's  cross-sectional examination.        Assessment & Plan:

## 2012-12-20 NOTE — Assessment & Plan Note (Signed)
No crepitance in neck This is most likely from severe coughing > See instructions for specific recommendations which were reviewed directly with the patient who was given a copy with highlighter outlining the key components.

## 2012-12-20 NOTE — Assessment & Plan Note (Addendum)
10/18/12 s/p L VATS> reviewed by Adrienne Mocha PARENCHYMAL SCARS WITH SURROUNDING ACUTE LUNG INJURY, no specific dx rendered  It does appear that her dz is prednisone responsive, at least partially, in that the arthalgias/hand changes/lung dz improve on higher doses though we still don't have an apparent rheumatologic dx and I would welcome a second opinion by [facility] working directly with [facility] to sort this out   Late add: there is no evidence of any pulmonary infection in all the work up done to date including the open lung biopsy so it is ok to treat her for inflammatory sources for her vasculitis with immunosuppressives.

## 2012-12-25 ENCOUNTER — Telehealth: Payer: Self-pay | Admitting: Internal Medicine

## 2012-12-25 ENCOUNTER — Institutional Professional Consult (permissible substitution): Payer: 59 | Admitting: Cardiovascular Disease

## 2012-12-25 NOTE — Telephone Encounter (Signed)
I spoke with pt. She reports her rheumatologists  is wanting to make sure she has no lung infection before start her on the new arthritis medication as stated above. She is needing a note stating this and sent to her rheumatologists. Please advise MW thanks

## 2012-12-25 NOTE — Telephone Encounter (Signed)
Send ov 12/18/12 that addresses this issue

## 2012-12-26 NOTE — Telephone Encounter (Signed)
LMOM for pt that ov note from 12/18/12 was faxed to Rheumatologist per Dr [name]

## 2013-01-01 ENCOUNTER — Ambulatory Visit: Payer: 59 | Admitting: Internal Medicine

## 2013-01-14 ENCOUNTER — Ambulatory Visit: Payer: 59 | Admitting: Internal Medicine

## 2013-01-29 ENCOUNTER — Encounter: Payer: Self-pay | Admitting: Internal Medicine

## 2013-01-29 ENCOUNTER — Ambulatory Visit (INDEPENDENT_AMBULATORY_CARE_PROVIDER_SITE_OTHER): Payer: 59 | Admitting: Internal Medicine

## 2013-01-29 VITALS — BP 138/90 | HR 121 | Temp 97.6°F

## 2013-01-29 DIAGNOSIS — L959 Vasculitis limited to the skin, unspecified: Secondary | ICD-10-CM

## 2013-01-29 DIAGNOSIS — L988 Other specified disorders of the skin and subcutaneous tissue: Secondary | ICD-10-CM

## 2013-01-29 DIAGNOSIS — R21 Rash and other nonspecific skin eruption: Secondary | ICD-10-CM

## 2013-01-29 MED ORDER — PREDNISONE (PAK) 10 MG PO TABS: ORAL_TABLET | ORAL | Status: DC

## 2013-01-29 MED ORDER — METOPROLOL TARTRATE 25 MG PO TABS: 25.0000 mg | ORAL_TABLET | Freq: Two times a day (BID) | ORAL | Status: DC

## 2013-01-29 NOTE — Progress Notes (Signed)
Pre-visit discussion using our clinic review tool. No additional management support is needed unless otherwise documented below in the visit note.  

## 2013-01-29 NOTE — Progress Notes (Signed)
Subjective:    Patient ID: Jamie [name], female    DOB: August 18, 1965, 47 y.o.   MRN: [id number]  HPI  Concerned about rash on face below eyes Denies swelling, discharge, vision change Denies change in moisturizer or facial soap Onset 1 week ago, no significant change in past 48 hours Tapering down from prednisone, on current 5 mg dose for past week pending followup rheumatology appointment on Friday ( 48 hours from now)  Past Medical History  Diagnosis Date  . Hypertension   . Multiple thyroid nodules   . History of cold sores   . 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 Photosensitivity 04/17/2012    3/14 new   . Pulmonary nodules/lesions, multiple 10/13/2012  . 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 Dysrhythmia     ST/notes 12/10/2012  . Pneumonia 02/2012    "once" (12/10/2012)  . 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 Rheumatoid arthritis     "legs; arms" (12/10/2012)    Review of Systems  Constitutional: Negative for fever, fatigue and unexpected weight change.  Respiratory: Negative for cough and shortness of breath.   Cardiovascular: Negative for chest pain and leg swelling.  Musculoskeletal: Positive for arthralgias. Negative for gait problem, joint swelling and myalgias.  Skin: Positive for rash. Negative for wound.       Objective:   Physical Exam BP 138/90  Pulse 121  Temp(Src) 97.6 F (36.4 C) (Oral)  SpO2 98% Wt Readings from Last 3 Encounters:  12/18/12 147 lb 6.4 oz (66.86 kg)  12/17/12 147 lb (66.679 kg)  12/10/12 149 lb 0.5 oz (67.6 kg)   Constitutional: She appears well-developed and well-nourished. No distress. Eyes: Conjunctivae and EOM are normal. Pupils are equal, round, and reactive to light. No scleral icterus.  Neck: Normal range of motion. Neck supple. No JVD present. No thyromegaly present.   Cardiovascular: Normal rate, regular rhythm and normal heart sounds.  No murmur heard. No BLE edema. Pulmonary/Chest: Effort normal and breath sounds normal. No respiratory distress. She has no wheezes.  Musculoskeletal:  Mild synovitis changes bilateral MCPsof hand.Normal range of motion, no joint effusions.  Neurological: She is alert and oriented to person, place, and time. No cranial nerve deficit. Coordination, balance, strength, speech and gait are normal.  Skin: dark macular rash across  Bridge of nose and below both eyes. Chronic vasculitis changes bilateral hands, especially palms  Psychiatric: She has a normal mood and affect. Her behavior is normal. Judgment and thought content normal.   Lab Results  Component Value Date   WBC 5.8 10/21/2012   HGB 13.2 10/21/2012   HCT 37.7 10/21/2012   PLT 151 10/21/2012   GLUCOSE 88 10/21/2012   CHOL 152 10/27/2010   TRIG 35.0 10/27/2010   HDL 50.80 10/27/2010   LDLCALC 94 10/27/2010   ALT 46* 10/17/2012   AST 47* 10/17/2012   NA 135 10/21/2012   K 3.7 10/21/2012   CL 97 10/21/2012   CREATININE 0.59 10/21/2012   BUN 18 10/21/2012   CO2 26 10/21/2012   TSH 0.576 10/12/2012   INR 0.88 10/17/2012   HGBA1C 5.9* 10/14/2012        Assessment & Plan:   Malar rash with darkened discoloration below each eye and bridge of nose  Chronic vasculitis bilateral palms/hands  Suspect relationship to underlying not yet defined autoimmune disease  Reviewed recent wean down on prednisone in past one month pending evaluation by specialty rheumatologist on Friday at [facility].  Reassurance provided  Has no fever, pleurisy, headache or symptoms of acute vasculitis flare, no need for prednisone burst at this time and patient furthermore declines increase in steroids  However, prednisone taper given to patient to fill if needed for worsening symptoms prior to Friday's evaluation at rheumatology

## 2013-01-29 NOTE — Patient Instructions (Signed)
It was good to see you today.  I suspect this rash is related to your ongoing problem with vasculitis  Prednisone burst if needed - prescription prited and given to you today  follow up with rheumatology on Friday as planned

## 2013-02-04 ENCOUNTER — Telehealth: Payer: Self-pay | Admitting: *Deleted

## 2013-02-04 MED ORDER — FLUCONAZOLE 100 MG PO TABS: ORAL_TABLET | ORAL | Status: DC

## 2013-02-04 NOTE — Telephone Encounter (Signed)
Pt called requesting Diflucan refill.  Medication is not on current medication list.  Please advise

## 2013-02-04 NOTE — Telephone Encounter (Signed)
Ok. Is it for oral thrush? Thx

## 2013-02-05 NOTE — Telephone Encounter (Signed)
Yes it is for Oral Thrush

## 2013-02-11 ENCOUNTER — Other Ambulatory Visit: Payer: Self-pay | Admitting: Obstetrics and Gynecology

## 2013-02-11 DIAGNOSIS — R921 Mammographic calcification found on diagnostic imaging of breast: Secondary | ICD-10-CM

## 2013-02-25 ENCOUNTER — Ambulatory Visit
Admission: RE | Admit: 2013-02-25 | Discharge: 2013-02-25 | Disposition: A | Discharge status: Home / Self Care | Payer: 59 | Source: Ambulatory Visit | Attending: Obstetrics and Gynecology | Admitting: Obstetrics and Gynecology

## 2013-02-25 DIAGNOSIS — R921 Mammographic calcification found on diagnostic imaging of breast: Secondary | ICD-10-CM

## 2013-02-26 ENCOUNTER — Telehealth: Payer: Self-pay | Admitting: *Deleted

## 2013-02-26 DIAGNOSIS — R05 Cough: Secondary | ICD-10-CM

## 2013-02-26 DIAGNOSIS — R059 Cough, unspecified: Secondary | ICD-10-CM

## 2013-02-26 NOTE — Telephone Encounter (Signed)
Patient phoned requesting recent diagnostic results.  Please advise.  CB# [phone]

## 2013-02-27 NOTE — Telephone Encounter (Signed)
Last labs I have were done in Sept 2014 Thx

## 2013-02-28 NOTE — Telephone Encounter (Signed)
Patient called requesting to have chest x-rays done stated when she cough she is having chest pains And she had pneumonia around this time last year  Please advise

## 2013-02-28 NOTE — Telephone Encounter (Signed)
OK CXR THx

## 2013-02-28 NOTE — Telephone Encounter (Signed)
Pt informed

## 2013-03-03 ENCOUNTER — Ambulatory Visit (INDEPENDENT_AMBULATORY_CARE_PROVIDER_SITE_OTHER)
Admission: RE | Admit: 2013-03-03 | Discharge: 2013-03-03 | Disposition: A | Discharge status: Home / Self Care | Payer: 59 | Source: Ambulatory Visit | Attending: Internal Medicine | Admitting: Internal Medicine

## 2013-03-03 ENCOUNTER — Emergency Department (HOSPITAL_COMMUNITY): Payer: 59

## 2013-03-03 ENCOUNTER — Encounter (HOSPITAL_COMMUNITY): Payer: Self-pay | Admitting: Emergency Medicine

## 2013-03-03 ENCOUNTER — Observation Stay (HOSPITAL_COMMUNITY)
Admission: EM | Admit: 2013-03-03 | Discharge: 2013-03-05 | Disposition: A | Discharge status: Home / Self Care | Payer: 59 | Attending: Internal Medicine | Admitting: Internal Medicine

## 2013-03-03 DIAGNOSIS — M3390 Dermatopolymyositis, unspecified, organ involvement unspecified: Secondary | ICD-10-CM | POA: Insufficient documentation

## 2013-03-03 DIAGNOSIS — M0520 Rheumatoid vasculitis with rheumatoid arthritis of unspecified site: Secondary | ICD-10-CM | POA: Diagnosis present

## 2013-03-03 DIAGNOSIS — R059 Cough, unspecified: Secondary | ICD-10-CM

## 2013-03-03 DIAGNOSIS — R21 Rash and other nonspecific skin eruption: Secondary | ICD-10-CM

## 2013-03-03 DIAGNOSIS — L988 Other specified disorders of the skin and subcutaneous tissue: Secondary | ICD-10-CM

## 2013-03-03 DIAGNOSIS — R079 Chest pain, unspecified: Secondary | ICD-10-CM

## 2013-03-03 DIAGNOSIS — R918 Other nonspecific abnormal finding of lung field: Secondary | ICD-10-CM

## 2013-03-03 DIAGNOSIS — Z09 Encounter for follow-up examination after completed treatment for conditions other than malignant neoplasm: Secondary | ICD-10-CM

## 2013-03-03 DIAGNOSIS — K5909 Other constipation: Secondary | ICD-10-CM

## 2013-03-03 DIAGNOSIS — E042 Nontoxic multinodular goiter: Secondary | ICD-10-CM | POA: Insufficient documentation

## 2013-03-03 DIAGNOSIS — R51 Headache: Secondary | ICD-10-CM

## 2013-03-03 DIAGNOSIS — R05 Cough: Secondary | ICD-10-CM

## 2013-03-03 DIAGNOSIS — D751 Secondary polycythemia: Secondary | ICD-10-CM

## 2013-03-03 DIAGNOSIS — M359 Systemic involvement of connective tissue, unspecified: Secondary | ICD-10-CM | POA: Insufficient documentation

## 2013-03-03 DIAGNOSIS — IMO0002 Reserved for concepts with insufficient information to code with codable children: Secondary | ICD-10-CM | POA: Insufficient documentation

## 2013-03-03 DIAGNOSIS — H9209 Otalgia, unspecified ear: Secondary | ICD-10-CM

## 2013-03-03 DIAGNOSIS — M2550 Pain in unspecified joint: Secondary | ICD-10-CM

## 2013-03-03 DIAGNOSIS — J982 Interstitial emphysema: Principal | ICD-10-CM

## 2013-03-03 DIAGNOSIS — L723 Sebaceous cyst: Secondary | ICD-10-CM

## 2013-03-03 DIAGNOSIS — Z0000 Encounter for general adult medical examination without abnormal findings: Secondary | ICD-10-CM

## 2013-03-03 DIAGNOSIS — M069 Rheumatoid arthritis, unspecified: Secondary | ICD-10-CM | POA: Insufficient documentation

## 2013-03-03 DIAGNOSIS — B370 Candidal stomatitis: Secondary | ICD-10-CM

## 2013-03-03 DIAGNOSIS — I776 Arteritis, unspecified: Secondary | ICD-10-CM

## 2013-03-03 DIAGNOSIS — M3313 Other dermatomyositis without myopathy: Secondary | ICD-10-CM | POA: Insufficient documentation

## 2013-03-03 DIAGNOSIS — L568 Other specified acute skin changes due to ultraviolet radiation: Secondary | ICD-10-CM

## 2013-03-03 DIAGNOSIS — M79606 Pain in leg, unspecified: Secondary | ICD-10-CM

## 2013-03-03 DIAGNOSIS — I00 Rheumatic fever without heart involvement: Secondary | ICD-10-CM | POA: Insufficient documentation

## 2013-03-03 DIAGNOSIS — E041 Nontoxic single thyroid nodule: Secondary | ICD-10-CM

## 2013-03-03 DIAGNOSIS — L959 Vasculitis limited to the skin, unspecified: Secondary | ICD-10-CM

## 2013-03-03 DIAGNOSIS — I10 Essential (primary) hypertension: Secondary | ICD-10-CM

## 2013-03-03 HISTORY — DX: Interstitial emphysema: J98.2

## 2013-03-03 LAB — HEPATIC FUNCTION PANEL
ALT: 54 U/L — AB (ref 0–35)
AST: 56 U/L — AB (ref 0–37)
Albumin: 3.3 g/dL — ABNORMAL LOW (ref 3.5–5.2)
Alkaline Phosphatase: 64 U/L (ref 39–117)
BILIRUBIN TOTAL: 0.3 mg/dL (ref 0.3–1.2)
Bilirubin, Direct: <0.2 mg/dL (ref 0.0–0.3)
Total Protein: 7.4 g/dL (ref 6.0–8.3)

## 2013-03-03 LAB — POCT I-STAT, CHEM 8
BUN: 21 mg/dL (ref 6–23)
CREATININE: 0.8 mg/dL (ref 0.50–1.10)
Calcium, Ion: 1.14 mmol/L (ref 1.12–1.23)
Chloride: 100 mEq/L (ref 96–112)
Glucose, Bld: 93 mg/dL (ref 70–99)
HCT: 40 % (ref 36.0–46.0)
Hemoglobin: 13.6 g/dL (ref 12.0–15.0)
Potassium: 3.6 mEq/L — ABNORMAL LOW (ref 3.7–5.3)
Sodium: 139 mEq/L (ref 137–147)
TCO2: 28 mmol/L (ref 0–100)

## 2013-03-03 LAB — POCT I-STAT TROPONIN I: Troponin i, poc: 0 ng/mL (ref 0.00–0.08)

## 2013-03-03 LAB — CBC WITH DIFFERENTIAL/PLATELET
Basophils Absolute: 0 10*3/uL (ref 0.0–0.1)
Basophils Relative: 0 % (ref 0–1)
Eosinophils Absolute: 0 10*3/uL (ref 0.0–0.7)
Eosinophils Relative: 0 % (ref 0–5)
HEMATOCRIT: 37.9 % (ref 36.0–46.0)
Hemoglobin: 13.1 g/dL (ref 12.0–15.0)
Lymphocytes Relative: 4 % — ABNORMAL LOW (ref 12–46)
Lymphs Abs: 0.4 10*3/uL — ABNORMAL LOW (ref 0.7–4.0)
MCH: 32.3 pg (ref 26.0–34.0)
MCHC: 34.6 g/dL (ref 30.0–36.0)
MCV: 93.6 fL (ref 78.0–100.0)
MONO ABS: 0.6 10*3/uL (ref 0.1–1.0)
Monocytes Relative: 6 % (ref 3–12)
Neutro Abs: 9.6 10*3/uL — ABNORMAL HIGH (ref 1.7–7.7)
Neutrophils Relative %: 90 % — ABNORMAL HIGH (ref 43–77)
Platelets: 177 10*3/uL (ref 150–400)
RBC: 4.05 MIL/uL (ref 3.87–5.11)
RDW: 15.2 % (ref 11.5–15.5)
WBC: 10.7 10*3/uL — AB (ref 4.0–10.5)

## 2013-03-03 MED ORDER — SODIUM CHLORIDE 0.9 % IV SOLN: INTRAVENOUS | Status: DC

## 2013-03-03 MED ORDER — IOHEXOL 350 MG/ML SOLN
80.0000 mL | Freq: Once | INTRAVENOUS | Status: AC | PRN
  Administered 2013-03-03: 80 mL via INTRAVENOUS

## 2013-03-03 MED ORDER — ONDANSETRON HCL 4 MG/2ML IJ SOLN
4.0000 mg | Freq: Once | INTRAMUSCULAR | Status: AC
  Administered 2013-03-03: 4 mg via INTRAVENOUS
  Filled 2013-03-03: qty 2

## 2013-03-03 MED ORDER — ONDANSETRON HCL 4 MG/2ML IJ SOLN
4.0000 mg | Freq: Four times a day (QID) | INTRAMUSCULAR | Status: DC | PRN
  Administered 2013-03-03: 4 mg via INTRAVENOUS
  Filled 2013-03-03: qty 2

## 2013-03-03 MED ORDER — OLMESARTAN-AMLODIPINE-HCTZ 20-5-12.5 MG PO TABS: 1.0000 | ORAL_TABLET | Freq: Every day | ORAL | Status: DC

## 2013-03-03 MED ORDER — PREDNISONE 20 MG PO TABS
40.0000 mg | ORAL_TABLET | Freq: Every day | ORAL | Status: DC
  Administered 2013-03-04 – 2013-03-05 (×2): 40 mg via ORAL
  Filled 2013-03-03 (×3): qty 2

## 2013-03-03 MED ORDER — SODIUM CHLORIDE 0.9 % IJ SOLN
3.0000 mL | Freq: Two times a day (BID) | INTRAMUSCULAR | Status: DC
  Administered 2013-03-03 – 2013-03-05 (×4): 3 mL via INTRAVENOUS

## 2013-03-03 MED ORDER — AMLODIPINE BESYLATE 5 MG PO TABS
5.0000 mg | ORAL_TABLET | Freq: Every day | ORAL | Status: DC
  Administered 2013-03-04 – 2013-03-05 (×2): 5 mg via ORAL
  Filled 2013-03-03 (×2): qty 1

## 2013-03-03 MED ORDER — HYDROMORPHONE HCL PF 1 MG/ML IJ SOLN
1.0000 mg | Freq: Once | INTRAMUSCULAR | Status: AC
  Administered 2013-03-03: 1 mg via INTRAVENOUS
  Filled 2013-03-03: qty 1

## 2013-03-03 MED ORDER — AZATHIOPRINE 50 MG PO TABS
50.0000 mg | ORAL_TABLET | Freq: Every day | ORAL | Status: DC
  Administered 2013-03-04: 50 mg via ORAL
  Filled 2013-03-03 (×3): qty 1

## 2013-03-03 MED ORDER — METOPROLOL TARTRATE 25 MG PO TABS
25.0000 mg | ORAL_TABLET | Freq: Two times a day (BID) | ORAL | Status: DC
  Administered 2013-03-03 – 2013-03-04 (×3): 25 mg via ORAL
  Filled 2013-03-03 (×5): qty 1

## 2013-03-03 MED ORDER — HYDROMORPHONE HCL PF 1 MG/ML IJ SOLN
0.5000 mg | INTRAMUSCULAR | Status: DC | PRN
  Administered 2013-03-03: 1 mg via INTRAVENOUS
  Filled 2013-03-03: qty 1

## 2013-03-03 MED ORDER — IRBESARTAN 150 MG PO TABS
150.0000 mg | ORAL_TABLET | Freq: Every day | ORAL | Status: DC
  Administered 2013-03-04 – 2013-03-05 (×2): 150 mg via ORAL
  Filled 2013-03-03 (×2): qty 1

## 2013-03-03 MED ORDER — HYDROCHLOROTHIAZIDE 12.5 MG PO CAPS
12.5000 mg | ORAL_CAPSULE | Freq: Every day | ORAL | Status: DC
  Administered 2013-03-04 – 2013-03-05 (×2): 12.5 mg via ORAL
  Filled 2013-03-03 (×2): qty 1

## 2013-03-03 MED ORDER — SODIUM CHLORIDE 0.9 % IV BOLUS (SEPSIS)
1000.0000 mL | Freq: Once | INTRAVENOUS | Status: AC
  Administered 2013-03-03: 1000 mL via INTRAVENOUS

## 2013-03-03 NOTE — ED Provider Notes (Signed)
CSN: [id number]     Arrival date & time 03/03/13  1830 History   First MD Initiated Contact with Patient 03/03/13 1910     Chief Complaint  Patient presents with  . Chest Pain   (Consider location/radiation/quality/duration/timing/severity/associated sxs/prior Treatment) Patient is a 48 y.o. female presenting with chest pain. The history is provided by the patient and the spouse.  Chest Pain Pain location:  L chest Pain quality: sharp and stabbing   Pain radiates to:  Does not radiate Pain radiates to the back: no   Pain severity:  Severe Onset quality:  Sudden Duration:  1 day Timing:  Constant Progression:  Worsening Chronicity:  Recurrent Context: breathing and raising an arm   Relieved by:  Nothing Worsened by:  Nothing tried Ineffective treatments:  None tried Associated symptoms: no altered mental status, no anorexia, no anxiety, no dizziness, no fever, no headache, no lower extremity edema, no nausea, no palpitations, no PND, no shortness of breath, no syncope and not vomiting   Risk factors: no aortic disease, no high cholesterol, no immobilization, not obese and not pregnant     Past Medical History  Diagnosis Date  . Hypertension   . Multiple thyroid nodules   . History of cold sores   . 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 Photosensitivity 04/17/2012    3/14 new   . Pulmonary nodules/lesions, multiple 10/13/2012  . 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 Dysrhythmia     ST/notes 12/10/2012  . Pneumonia 02/2012    "once" (12/10/2012)  . 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 Rheumatoid arthritis     "legs; arms" (12/10/2012)   Past Surgical History  Procedure Laterality Date  . Video bronchoscopy Left 10/18/2012    Procedure: VIDEO BRONCHOSCOPY;  Surgeon: Rexene Alberts, MD;  Location: [place];  Service: Thoracic;   Laterality: Left;  . Video assisted thoracoscopy Left 10/18/2012    Procedure: VIDEO ASSISTED THORACOSCOPY;  Surgeon: Rexene Alberts, MD;  Location: [place];  Service: Thoracic;  Laterality: Left;  . Lung biopsy Left 10/18/2012    Procedure: LUNG BIOPSY;  Surgeon: Rexene Alberts, MD;  Location: [place];  Service: Thoracic;  Laterality: Left;  . Abdominal hysterectomy  ~ 2006   Family History  Problem Relation Age of Onset  . Hypertension Other   . Hypertension Mother   . Cancer Father 30    prostate ca   History  Substance Use Topics  . Smoking status: Never Smoker   . Smokeless tobacco: Never Used  . Alcohol Use: No   OB History   Grav Para Term Preterm Abortions TAB SAB Ect Mult Living                 Review of Systems  Constitutional: Negative for fever and chills.  HENT: Negative for congestion and rhinorrhea.   Eyes: Negative for redness and visual disturbance.  Respiratory: Negative for shortness of breath and wheezing.   Cardiovascular: Positive for chest pain (chest wall pain). Negative for palpitations, syncope and PND.  Gastrointestinal: Negative for nausea, vomiting and anorexia.  Genitourinary: Negative for dysuria and urgency.  Musculoskeletal: Negative for arthralgias and myalgias.  Skin: Negative for pallor and wound.  Neurological: Negative for dizziness and headaches.    Allergies  Benazepril hcl; Tramadol; and Ciprofloxacin  Home Medications   No current outpatient  prescriptions on file. BP 147/101  Pulse 115  Temp(Src) 98.3 F (36.8 C) (Oral)  Resp 18  Ht 5\' 7"  (1.702 m)  Wt 140 lb 11.2 oz (63.821 kg)  BMI 22.03 kg/m2  SpO2 97% Physical Exam  Constitutional: She is oriented to person, place, and time. She appears well-developed and well-nourished. No distress.  HENT:  Head: Normocephalic and atraumatic.  Eyes: EOM are normal. Pupils are equal, round, and reactive to light.  Neck: Normal range of motion. Neck supple.  Cardiovascular: Normal rate  and regular rhythm.  Exam reveals no gallop and no friction rub.   No murmur heard. Pulmonary/Chest: Effort normal. No respiratory distress. She has no wheezes. She has no rales. She exhibits tenderness (TTP about the L mid axillary line, just under the armpit.).  Abdominal: Soft. She exhibits no distension. There is no tenderness.  Musculoskeletal: She exhibits no edema and no tenderness.  Neurological: She is alert and oriented to person, place, and time.  Skin: Skin is warm and dry. She is not diaphoretic.  Psychiatric: She has a normal mood and affect. Her behavior is normal.    ED Course  Procedures (including critical care time) Labs Review Labs Reviewed  CBC WITH DIFFERENTIAL - Abnormal; Notable for the following:    WBC 10.7 (*)    Neutrophils Relative % 90 (*)    Neutro Abs 9.6 (*)    Lymphocytes Relative 4 (*)    Lymphs Abs 0.4 (*)    All other components within normal limits  HEPATIC FUNCTION PANEL - Abnormal; Notable for the following:    Albumin 3.3 (*)    AST 56 (*)    ALT 54 (*)    All other components within normal limits  POCT I-STAT, CHEM 8 - Abnormal; Notable for the following:    Potassium 3.6 (*)    All other components within normal limits  CBC  BASIC METABOLIC PANEL  POCT I-STAT TROPONIN I   Imaging Review Dg Chest 2 View  03/03/2013   CLINICAL DATA:  Cough, chest pain  EXAM: CHEST  2 VIEW  COMPARISON:  12/11/2012  FINDINGS: Subpleural reticulation/fibrosis with patchy nodularity, most prominent in the left mid lung, chronic. No focal consolidation suspicious for pneumonia. No pleural effusion or pneumothorax.  Heart is normal in size.  Mild degenerative changes of the visualized thoracolumbar spine.  IMPRESSION: Chronic subpleural reticulation/fibrosis with patchy nodularity, unchanged.  No evidence of acute cardiopulmonary disease.   Electronically Signed   By: Julian Hy M.D.   On: 03/03/2013 16:18   Ct Angio Chest Pe W/cm &/or Wo Cm  03/03/2013    CLINICAL DATA:  Worsening left-sided chest pain since this morning. History of rheumatoid vasculitis and pulmonary nodules.  EXAM: CT ANGIOGRAPHY CHEST WITH CONTRAST  TECHNIQUE: Multidetector CT imaging of the chest was performed using the standard protocol during bolus administration of intravenous contrast. Multiplanar CT image reconstructions including MIPs were obtained to evaluate the vascular anatomy.  CONTRAST:  80 mL OMNIPAQUE IOHEXOL 350 MG/ML SOLN  COMPARISON:  CT chest 10/11/2012 in 11/2018 09/2002.  FINDINGS: No pulmonary embolus is identified. There is pneumomediastinum as seen on the most recent chest CT scan. Volume of pneumomediastinum appears slightly increased. A trace amount of pleural fluid is present bilaterally.  Innumerable foci of airspace disease are identified appear worst on the prior study. Index area of airspace opacity in the right upper lobe on image 44 measures 1.9 cm by 1.2 cm in the axial plane compared to  1.0 x 1.5 cm on the prior study. Previously seen index opacity in the right lower lobe which had measured 2.4 x 1.5 cm today measures 2.7 x 1.8 cm. As on the prior examination, some of these nodular opacities are cavitary. A new prominent area cavitation is seen along the posterior margin of the right upper lobe.  Imaged upper abdomen again demonstrates a low-attenuation lesion in the posterior right hepatic lobe which today measures approximately 2.5 by 1.6 cm compared at 3.3 x 2.0 cm. Difference in measurement may be due to technical factors. Imaged upper abdomen is otherwise unremarkable. No focal bony abnormality is identified.  Review of the MIP images confirms the above findings.  IMPRESSION: Negative for pulmonary embolus.  Interval progression of multifocal nodular opacities some of which demonstrate and crease cavitation.  Pneumomediastinum.  Critical Value/emergent results were called by telephone at the time of interpretation on 03/03/2013 at 8:06 PM to Dr. Deno Etienne ,  who verbally acknowledged these results.   Electronically Signed   By: Inge Rise M.D.   On: 03/03/2013 20:09    EKG Interpretation    Date/Time:  Monday March 03 2013 18:48:36 EST Ventricular Rate:  120 PR Interval:  117 QRS Duration: 78 QT Interval:  326 QTC Calculation: 461 R Axis:   20 Text Interpretation:  Sinus tachycardia Probable left atrial enlargement Borderline repolarization abnormality Baseline wander in lead(s) V3 When compared with ECG of 12/11/2012, No significant change was found Confirmed by [facility]  MD, DAVID ([id number]) on 03/03/2013 6:55:08 PM            MDM   1. Pneumomediastinum   2. Pulmonary infiltrates   3. Pulmonary nodules/lesions, multiple   4. Rheumatoid vasculitis   5. Vasculitis of skin    Patient is a 48 y.o. female who presents with chest pain.  This started this mornig.  Patient felt it happen after she coughed.  Hx of pneumopertinuem, states this feels just like when she had that.  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  Changed treatment due to liver injury.    CTA of the chest negative for PE, but with recurrent pneumoperitonuem, and increased lung nodules.  Spoke with pulm, who recommend medicine admit.       Deno Etienne, MD 03/04/13 [phone]

## 2013-03-03 NOTE — ED Provider Notes (Signed)
48 year old female with history of rheumatoid arthritis and past history of pneumomediastinum comes in with onset today of left-sided chest pain which is sharp and pleuritic. This pain is similar to what she had in the past with pneumomediastinum. On exam, lungs are clear and heart is tachycardic but without murmur or rub. She was sent for a CT angiogram to look for evidence of pulmonary embolism and also evidence of pneumomediastinum. CT angiogram shows evidence of pneumomediastinum but no pulmonary emboli. She'll need to be admitted for careful observation.  I saw and evaluated the patient, reviewed the resident's note and I agree with the findings and plan.  EKG Interpretation    Date/Time:  Monday March 03 2013 18:48:36 EST Ventricular Rate:  120 PR Interval:  117 QRS Duration: 78 QT Interval:  326 QTC Calculation: 461 R Axis:   20 Text Interpretation:  Sinus tachycardia Probable left atrial enlargement Borderline repolarization abnormality Baseline wander in lead(s) V3 When compared with ECG of 12/11/2012, No significant change was found Confirmed by Roxanne Mins  MD, Gracielynn Birkel ([id number]) on 03/03/2013 6:55:08 PM              Delora Fuel, MD 42/35/36 [id number]

## 2013-03-03 NOTE — ED Notes (Signed)
Pt to ED via POV for eval of left side chest/rib pain. States she thought she slept wrong due to mild pain throughout the day. Pt went to PMD's office this afternoon and had CXR but PMD was unavailable for eval. Pain increased this evening. Pt tearful. Unable to move left arm.

## 2013-03-03 NOTE — H&P (Signed)
[facility] History and Physical  [name] [id number] DOB: 1965-10-20 DOA: 03/03/2013  Referring physician: EDP PCP: Walker Kehr, MD   Chief Complaint: Chest pain   HPI: Jamie [name] is a 48 y.o. female with PMH of dermatomyositis and pulmonary vasculitis.  Also has history of pneumomediastinum 10/28 felt to be due to bleb rupture as her esophagram was negative at that time.  Patient presents to the ED with new onset chest pain.  Symptoms onset this morning, pain is pleruitic and nearly identical to past pneumomediastinum pain.  She presented to the ED as a result.  In ED, CT chest shows progression of her pulmonary disease and possibly slight increase in her pneumomediastinum from October.  Hospitalist asked to admit and pulm consulted.  Review of Systems: Systems reviewed.  As above, otherwise negative  Past Medical History  Diagnosis Date  . Hypertension   . Multiple thyroid nodules   . History of cold sores   . 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 Photosensitivity 04/17/2012    3/14 new   . Pulmonary nodules/lesions, multiple 10/13/2012  . 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 Dysrhythmia     ST/notes 12/10/2012  . Pneumonia 02/2012    "once" (12/10/2012)  . 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 Rheumatoid arthritis     "legs; arms" (12/10/2012)   Past Surgical History  Procedure Laterality Date  . Video bronchoscopy Left 10/18/2012    Procedure: VIDEO BRONCHOSCOPY;  Surgeon: Rexene Alberts, MD;  Location: [place];  Service: Thoracic;  Laterality: Left;  . Video assisted thoracoscopy Left 10/18/2012    Procedure: VIDEO ASSISTED THORACOSCOPY;  Surgeon: Rexene Alberts, MD;  Location: [place];  Service: Thoracic;  Laterality: Left;  . Lung biopsy Left 10/18/2012    Procedure: LUNG BIOPSY;  Surgeon: Rexene Alberts, MD;  Location: [place];  Service: Thoracic;  Laterality: Left;  . Abdominal hysterectomy  ~ 2006   Social History:  reports that she has never smoked. She has never used smokeless tobacco. She reports that she does not drink alcohol or use illicit drugs.  Allergies  Allergen Reactions  . Benazepril Hcl     REACTION: cough  . Tramadol     n/v  . Ciprofloxacin Rash    Joint pain REACTION: Rhabdomyolysis    Family History  Problem Relation Age of Onset  . Hypertension Other   . Hypertension Mother   . Cancer Father 40    prostate ca     Prior to Admission medications   Medication Sig Start Date End Date Taking? Authorizing Provider  azaTHIOprine (IMURAN) 50 MG tablet Take 50 mg by mouth daily.  01/31/13  Yes Historical Provider, MD  metoprolol tartrate (LOPRESSOR) 25 MG tablet Take 1 tablet (25 mg total) by mouth 2 (two) times daily. 01/29/13  Yes Rowe Clack, MD  Nutritional Supplements (JUICE PLUS FIBRE PO) Take 1 capsule by mouth daily.   Yes Historical Provider, MD  Olmesartan-Amlodipine-HCTZ (TRIBENZOR) 20-5-12.5 MG TABS Take 1 tablet by mouth daily. 12/11/12  Yes Donita Brooks, NP  predniSONE (DELTASONE) 20 MG tablet Take 40 mg by mouth daily with breakfast.   Yes Historical Provider, MD   Physical Exam: Filed Vitals:   03/03/13 2100  BP: 127/79  Pulse: 109  Temp:   Resp: 17    BP  127/79  Pulse 109  Temp(Src) 99.4 F (37.4 C) (Oral)  Resp 17  SpO2 94%  General Appearance:    Alert, oriented, no distress, appears stated age  Head:    Normocephalic, atraumatic  Eyes:    PERRL, EOMI, sclera non-icteric        Nose:   Nares without drainage or epistaxis. Mucosa, turbinates normal  Throat:   Moist mucous membranes. Oropharynx without erythema or exudate.  Neck:   Supple. No carotid bruits.  No thyromegaly.  No lymphadenopathy.   Back:     No CVA tenderness, no spinal tenderness  Lungs:     Clear to auscultation bilaterally, without wheezes, rhonchi or  rales  Chest wall:    No tenderness to palpitation  Heart:    Regular rate and rhythm without murmurs, gallops, rubs  Abdomen:     Soft, non-tender, nondistended, normal bowel sounds, no organomegaly  Genitalia:    deferred  Rectal:    deferred  Extremities:   No clubbing, cyanosis or edema.  Pulses:   2+ and symmetric all extremities  Skin:   Skin color, texture, turgor normal, no rashes or lesions  Lymph nodes:   Cervical, supraclavicular, and axillary nodes normal  Neurologic:   CNII-XII intact. Normal strength, sensation and reflexes      throughout    Labs on Admission:  Basic Metabolic Panel:  Recent Labs Lab 03/03/13 1930  NA 139  K 3.6*  CL 100  GLUCOSE 93  BUN 21  CREATININE 0.80   Liver Function Tests:  Recent Labs Lab 03/03/13 1905  AST 56*  ALT 54*  ALKPHOS 64  BILITOT 0.3  PROT 7.4  ALBUMIN 3.3*   No results found for this basename: LIPASE, AMYLASE,  in the last 168 hours No results found for this basename: AMMONIA,  in the last 168 hours CBC:  Recent Labs Lab 03/03/13 1905 03/03/13 1930  WBC 10.7*  --   NEUTROABS 9.6*  --   HGB 13.1 13.6  HCT 37.9 40.0  MCV 93.6  --   PLT 177  --    Cardiac Enzymes: No results found for this basename: CKTOTAL, CKMB, CKMBINDEX, TROPONINI,  in the last 168 hours  BNP (last 3 results) No results found for this basename: PROBNP,  in the last 8760 hours CBG: No results found for this basename: GLUCAP,  in the last 168 hours  Radiological Exams on Admission: Dg Chest 2 View  03/03/2013   CLINICAL DATA:  Cough, chest pain  EXAM: CHEST  2 VIEW  COMPARISON:  12/11/2012  FINDINGS: Subpleural reticulation/fibrosis with patchy nodularity, most prominent in the left mid lung, chronic. No focal consolidation suspicious for pneumonia. No pleural effusion or pneumothorax.  Heart is normal in size.  Mild degenerative changes of the visualized thoracolumbar spine.  IMPRESSION: Chronic subpleural reticulation/fibrosis with  patchy nodularity, unchanged.  No evidence of acute cardiopulmonary disease.   Electronically Signed   By: Julian Hy M.D.   On: 03/03/2013 16:18   Ct Angio Chest Pe W/cm &/or Wo Cm  03/03/2013   CLINICAL DATA:  Worsening left-sided chest pain since this morning. History of rheumatoid vasculitis and pulmonary nodules.  EXAM: CT ANGIOGRAPHY CHEST WITH CONTRAST  TECHNIQUE: Multidetector CT imaging of the chest was performed using the standard protocol during bolus administration of intravenous contrast. Multiplanar CT image reconstructions including MIPs were obtained to evaluate the vascular anatomy.  CONTRAST:  80 mL OMNIPAQUE IOHEXOL 350 MG/ML SOLN  COMPARISON:  CT  chest 10/11/2012 in 11/2018 09/2002.  FINDINGS: No pulmonary embolus is identified. There is pneumomediastinum as seen on the most recent chest CT scan. Volume of pneumomediastinum appears slightly increased. A trace amount of pleural fluid is present bilaterally.  Innumerable foci of airspace disease are identified appear worst on the prior study. Index area of airspace opacity in the right upper lobe on image 44 measures 1.9 cm by 1.2 cm in the axial plane compared to 1.0 x 1.5 cm on the prior study. Previously seen index opacity in the right lower lobe which had measured 2.4 x 1.5 cm today measures 2.7 x 1.8 cm. As on the prior examination, some of these nodular opacities are cavitary. A new prominent area cavitation is seen along the posterior margin of the right upper lobe.  Imaged upper abdomen again demonstrates a low-attenuation lesion in the posterior right hepatic lobe which today measures approximately 2.5 by 1.6 cm compared at 3.3 x 2.0 cm. Difference in measurement may be due to technical factors. Imaged upper abdomen is otherwise unremarkable. No focal bony abnormality is identified.  Review of the MIP images confirms the above findings.  IMPRESSION: Negative for pulmonary embolus.  Interval progression of multifocal nodular  opacities some of which demonstrate and crease cavitation.  Pneumomediastinum.  Critical Value/emergent results were called by telephone at the time of interpretation on 03/03/2013 at 8:06 PM to Dr. Deno Etienne , who verbally acknowledged these results.   Electronically Signed   By: Inge Rise M.D.   On: 03/03/2013 20:09    EKG: Independently reviewed.  Assessment/Plan Principal Problem:   Pneumomediastinum Active Problems:   Pulmonary infiltrates   Rheumatoid vasculitis   Pulmonary nodules/lesions, multiple   1. Pneumomediastinum - after talking with pulmonology Dr. Nelda Marseille, this is almost certainly a small bleb rupture due to her progressive vasculitis associated with her autoimmune disease.  He does not feel that she needs to be kept NPO at this time as given past work up and progression of lung disease it is felt to be unlikely to be related to a perforated esophagus.  Unfortunately her disease is ultimately likely to continue to progress despite immunosuppressive treatment and she will ultimately likely require lung transplant according to Dr. Nelda Marseille, he states they will send someone to put this in writing.  Uncertain if the time to requiring transplant could be increased by other immunosuppressives, but as it is clear that 40mg  prednisone and imuran are not controlling her disease she does need to follow up with rheumatology (sees rheum at [facility]).    Code Status: Full Code  Family Communication: Family at bedside, all questions answered including likely eventual need for lung transplantation. Disposition Plan: Admit to obs   Time spent: 70 min  Dariel Pellecchia M. [facility] Pager [phone]  If 7AM-7PM, please contact the day team taking care of the patient [email or web address] Password [facility] 03/03/2013, 9:45 PM

## 2013-03-03 NOTE — Consult Note (Signed)
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    SIGNIFICANT EVENTS / STUDIES:  10/18/12 - VATS bx (reviewed by Katzenstein)>>> multiple areas of parenchyma scarring.  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 In other places there are small foci of organizing pneumonia. Both of these latter features likely represent nonspecific changes of acute lung injury surrounding the process.  Specifically, I do not see thromboemboli that would support scarred infarcts nor do I see evidence of a vasculitis. 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 Some venules occluded by hyalinized fibrosis are seen within the scarred areas, and I wonder whether that finding may be a cause of the scarring or perhaps only a secondary finding. I do not see changes of eosinophilic pneumonia, nor is there granulomatous inflammation that would suggest either infection or Wegener's . I am afraid, therefore, that we are left with only a descriptive diagnosis".   1/15 - Admit with pneumomediastinum.  CT Chest: neg for PE, Interval progression of multifocal nodular opacities some of which demonstrate increase cavitation.  CULTURES:  ANTIBIOTICS:  HISTORY OF PRESENT ILLNESS:  48 y.o. y/o female with a PMH of amyopathic dermatomyositis and progressive nodular pulmonary infiltrates who began with symptoms in Jan of 2014 and has undergone a L VATS biopsy (10/2012) which was non-specific in regards to  diagnosis and had recent admission in 11/2012 for incidental finding of pneumomediastinum on a scheduled CT.  At that time, she noted an increase in her dry cough. She was discharged on 10/28 and had follow up with Cardiology in office regarding ECHO & EKG in the setting of tachycardia. Cardiology work up notedthe following:   normal LV function with no evidence of pericardial effusion. No evidence of PE on CTA August 2014. Recent normal thyroid studies. Her sinus tachycardia is likely a normal response to some other metabolic abnormality. She is also on steroids. No further cardiac testing. Titrate metoprolol to 50 mg po BID if tachycardia persists.  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  She was placed on prednisone 40 mg and imuran 50 mg BID.  Since that time, she had follow up lab work and was instructed to stop taking imuran but patient reduced to 50 mg daily because "it helped so much with the joint pain".    Patient presented to [facility] ER on 1/19 with complaints of sudden left sided chest pain while driving home from work.  She reports an increase in her dry cough over the past week.  Denies sputum production, hemoptysis, shortness of breath.  She has had recent thrush and was treated with diflucan.  She denies fevers, chills, n/v/d, weight loss.    PAST MEDICAL HISTORY :  Past Medical History  Diagnosis Date  . Hypertension   . Multiple thyroid nodules   . History of cold sores   . 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  RA   . Photosensitivity 04/17/2012    3/14 new   . Pulmonary nodules/lesions, multiple 10/13/2012  . 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 Dysrhythmia      ST/notes 12/10/2012  . Pneumonia 02/2012    "once" (12/10/2012)  . 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 Rheumatoid arthritis     "legs; arms" (12/10/2012)   Past Surgical History  Procedure Laterality Date  . Video bronchoscopy Left 10/18/2012    Procedure: VIDEO BRONCHOSCOPY;  Surgeon: Rexene Alberts, MD;  Location: [place];  Service: Thoracic;  Laterality: Left;  . Video assisted thoracoscopy Left 10/18/2012    Procedure: VIDEO ASSISTED THORACOSCOPY;  Surgeon: Rexene Alberts, MD;  Location: [place];  Service: Thoracic;  Laterality: Left;  . Lung biopsy Left 10/18/2012    Procedure: LUNG BIOPSY;  Surgeon: Rexene Alberts, MD;  Location: [place];  Service: Thoracic;  Laterality: Left;  . Abdominal hysterectomy  ~ 2006   Prior to Admission medications   Medication Sig Start Date End Date Taking? Authorizing Provider  azaTHIOprine (IMURAN) 50 MG tablet Take 50 mg by mouth daily.  01/31/13  Yes Historical Provider, MD  metoprolol tartrate (LOPRESSOR) 25 MG tablet Take 1 tablet (25 mg total) by mouth 2 (two) times daily. 01/29/13  Yes Rowe Clack, MD  Nutritional Supplements (JUICE PLUS FIBRE PO) Take 1 capsule by mouth daily.   Yes Historical Provider, MD  Olmesartan-Amlodipine-HCTZ (TRIBENZOR) 20-5-12.5 MG TABS Take 1 tablet by mouth daily. 12/11/12  Yes Donita Brooks, NP  predniSONE (DELTASONE) 20 MG tablet Take 40 mg by mouth daily with breakfast.   Yes Historical Provider, MD   Allergies  Allergen Reactions  . Benazepril Hcl     REACTION: cough  . Tramadol     n/v  . Ciprofloxacin Rash    Joint pain REACTION: Rhabdomyolysis    FAMILY HISTORY:  Family History  Problem Relation Age of Onset  . Hypertension Other   . Hypertension Mother   . Cancer Father 3    prostate ca   SOCIAL HISTORY:  reports that she has never smoked. She has never used smokeless tobacco. She reports that she does not drink alcohol or use illicit drugs.  REVIEW OF SYSTEMS:   Constitutional:  Negative for fever, chills, weight loss, malaise/fatigue and diaphoresis.  HENT: Negative for hearing loss, ear pain, nosebleeds, congestion, sore throat, neck pain, tinnitus and ear discharge.  Positive for intermittent thrush. Eyes: Negative for blurred vision, double vision, photophobia, pain, discharge and redness.  Respiratory: Negative for hemoptysis, sputum production, shortness of breath, wheezing and stridor.  Positive for dry cough x 1 wwek and abrupt onset chest pain 1/15.   Cardiovascular: Negative for chest pain, palpitations, orthopnea, claudication, leg swelling and PND.  Gastrointestinal: Negative for heartburn, nausea, vomiting, abdominal pain, diarrhea, constipation, blood in stool and melena.  Genitourinary: Negative for dysuria, urgency, frequency, hematuria and flank pain.  Musculoskeletal: Negative for myalgias, back pain, and falls.  Positive for joint pain, improved on Imuran.  Skin: Negative for itching and rash.  Neurological: Negative for dizziness, tingling, tremors, sensory change, speech change, focal weakness, seizures, loss of consciousness, weakness and headaches.  Endo/Heme/Allergies: Negative for environmental allergies and polydipsia. Does not bruise/bleed easily.  SUBJECTIVE:   VITAL SIGNS: Temp:  [98.4 F (36.9 C)-99.4 F (37.4 C)] 99.4 F (37.4 C) (01/19 1915) Pulse Rate:  [96-136] 109 (01/19 2100) Resp:  [17-27]  17 (01/19 2100) BP: (127-146)/(79-100) 127/79 mmHg (01/19 2100) SpO2:  [94 %-97 %] 94 % (01/19 2100)  PHYSICAL EXAMINATION: General:  wdwn adult female in NAD Neuro:  AAOx4, speech clear, MAE HEENT:  Mm pink/moist, no jvd, infraorbital erythema  Cardiovascular:  s1s2 rrr, tachy Lungs:  resp's even/non-labored, lungs bilaterally with fine crackles lower posterior bilaterally.  No crepitus noted to palpation.  Abdomen:  Round/soft, bsx4 active Musculoskeletal:  No acute deformities Skin:  Warm/dry, scattered ulcerated, hyperpigmented  nodules of varying degree on hands  CBC  Recent Labs Lab 03/03/13 1905 03/03/13 1930 03/04/13 0136  WBC 10.7*  --  9.6  HGB 13.1 13.6 12.1  HCT 37.9 40.0 36.3  PLT 177  --  150   Coag's No results found for this basename: APTT, INR,  in the last 168 hours BMET  Recent Labs Lab 03/03/13 1930 03/04/13 0136  NA 139 141  K 3.6* 3.5*  CL 100 102  CO2  --  26  BUN 21 15  CREATININE 0.80 0.71  GLUCOSE 93 143*   Electrolytes  Recent Labs Lab 03/04/13 0136  CALCIUM 8.4   Sepsis Markers No results found for this basename: LATICACIDVEN, PROCALCITON, O2SATVEN,  in the last 168 hours ABG No results found for this basename: PHART, PCO2ART, PO2ART,  in the last 168 hours Liver Enzymes  Recent Labs Lab 03/03/13 1905  AST 56*  ALT 54*  ALKPHOS 64  BILITOT 0.3  ALBUMIN 3.3*   Cardiac Enzymes  Recent Labs Lab 03/04/13 0136  TROPONINI <0.30   Glucose No results found for this basename: GLUCAP,  in the last 168 hours  Dg Chest 2 View  03/03/2013   CLINICAL DATA:  Cough, chest pain  EXAM: CHEST  2 VIEW  COMPARISON:  12/11/2012  FINDINGS: Subpleural reticulation/fibrosis with patchy nodularity, most prominent in the left mid lung, chronic. No focal consolidation suspicious for pneumonia. No pleural effusion or pneumothorax.  Heart is normal in size.  Mild degenerative changes of the visualized thoracolumbar spine.  IMPRESSION: Chronic subpleural reticulation/fibrosis with patchy nodularity, unchanged.  No evidence of acute cardiopulmonary disease.   Electronically Signed   By: Julian Hy M.D.   On: 03/03/2013 16:18   Ct Angio Chest Pe W/cm &/or Wo Cm  03/03/2013   CLINICAL DATA:  Worsening left-sided chest pain since this morning. History of rheumatoid vasculitis and pulmonary nodules.  EXAM: CT ANGIOGRAPHY CHEST WITH CONTRAST  TECHNIQUE: Multidetector CT imaging of the chest was performed using the standard protocol during bolus administration of intravenous  contrast. Multiplanar CT image reconstructions including MIPs were obtained to evaluate the vascular anatomy.  CONTRAST:  80 mL OMNIPAQUE IOHEXOL 350 MG/ML SOLN  COMPARISON:  CT chest 10/11/2012 in 11/2018 09/2002.  FINDINGS: No pulmonary embolus is identified. There is pneumomediastinum as seen on the most recent chest CT scan. Volume of pneumomediastinum appears slightly increased. A trace amount of pleural fluid is present bilaterally.  Innumerable foci of airspace disease are identified appear worst on the prior study. Index area of airspace opacity in the right upper lobe on image 44 measures 1.9 cm by 1.2 cm in the axial plane compared to 1.0 x 1.5 cm on the prior study. Previously seen index opacity in the right lower lobe which had measured 2.4 x 1.5 cm today measures 2.7 x 1.8 cm. As on the prior examination, some of these nodular opacities are cavitary. A new prominent area cavitation is seen along the posterior margin of the right upper  lobe.  Imaged upper abdomen again demonstrates a low-attenuation lesion in the posterior right hepatic lobe which today measures approximately 2.5 by 1.6 cm compared at 3.3 x 2.0 cm. Difference in measurement may be due to technical factors. Imaged upper abdomen is otherwise unremarkable. No focal bony abnormality is identified.  Review of the MIP images confirms the above findings.  IMPRESSION: Negative for pulmonary embolus.  Interval progression of multifocal nodular opacities some of which demonstrate and crease cavitation.  Pneumomediastinum.  Critical Value/emergent results were called by telephone at the time of interpretation on 03/03/2013 at 8:06 PM to Dr. Deno Etienne , who verbally acknowledged these results.   Electronically Signed   By: Inge Rise M.D.   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 Melvyn Novas [facility]) -rheumatology follow-up with Dr. Veneta [facility]) -repeat LFT -hold Imuran due to elevated LFT's -continue prednisone 40 mg daily -cough suppression   Noe Gens, NP-C [facility] Pgr: [phone] or [phone]  03/03/2013, 10:01 PM  I have personally obtained history, examined patient, evaluated and interpreted laboratory and imaging results, reviewed medical records, formulated assessment / plan and placed orders.  Doree Fudge, MD [facility] [place] Pager: [phone]  03/04/2013, 7:51 AM

## 2013-03-04 ENCOUNTER — Encounter (HOSPITAL_COMMUNITY): Payer: Self-pay | Admitting: General Practice

## 2013-03-04 DIAGNOSIS — R05 Cough: Secondary | ICD-10-CM

## 2013-03-04 DIAGNOSIS — R059 Cough, unspecified: Secondary | ICD-10-CM

## 2013-03-04 DIAGNOSIS — R079 Chest pain, unspecified: Secondary | ICD-10-CM

## 2013-03-04 DIAGNOSIS — R21 Rash and other nonspecific skin eruption: Secondary | ICD-10-CM

## 2013-03-04 LAB — CBC
HCT: 36.3 % (ref 36.0–46.0)
Hemoglobin: 12.1 g/dL (ref 12.0–15.0)
MCH: 31.7 pg (ref 26.0–34.0)
MCHC: 33.3 g/dL (ref 30.0–36.0)
MCV: 95 fL (ref 78.0–100.0)
Platelets: 150 10*3/uL (ref 150–400)
RBC: 3.82 MIL/uL — AB (ref 3.87–5.11)
RDW: 15.3 % (ref 11.5–15.5)
WBC: 9.6 10*3/uL (ref 4.0–10.5)

## 2013-03-04 LAB — BASIC METABOLIC PANEL
BUN: 15 mg/dL (ref 6–23)
CALCIUM: 8.4 mg/dL (ref 8.4–10.5)
CHLORIDE: 102 meq/L (ref 96–112)
CO2: 26 meq/L (ref 19–32)
Creatinine, Ser: 0.71 mg/dL (ref 0.50–1.10)
GFR calc Af Amer: >90 mL/min (ref >90)
GFR calc non Af Amer: >90 mL/min (ref >90)
Glucose, Bld: 143 mg/dL — ABNORMAL HIGH (ref 70–99)
Potassium: 3.5 mEq/L — ABNORMAL LOW (ref 3.7–5.3)
SODIUM: 141 meq/L (ref 137–147)

## 2013-03-04 LAB — TROPONIN I

## 2013-03-04 MED ORDER — ENOXAPARIN SODIUM 40 MG/0.4ML ~~LOC~~ SOLN
40.0000 mg | SUBCUTANEOUS | Status: DC
  Administered 2013-03-04: 40 mg via SUBCUTANEOUS
  Filled 2013-03-04 (×2): qty 0.4

## 2013-03-04 MED ORDER — HYDROMORPHONE HCL PF 1 MG/ML IJ SOLN: 0.5000 mg | INTRAMUSCULAR | Status: DC | PRN

## 2013-03-04 MED ORDER — OXYCODONE HCL 5 MG PO TABS
5.0000 mg | ORAL_TABLET | ORAL | Status: DC | PRN
  Administered 2013-03-04 – 2013-03-05 (×3): 5 mg via ORAL
  Filled 2013-03-04 (×3): qty 1

## 2013-03-04 MED ORDER — HYDROCOD POLST-CHLORPHEN POLST 10-8 MG/5ML PO LQCR
5.0000 mL | Freq: Two times a day (BID) | ORAL | Status: DC
Start: 2013-03-04 — End: 2013-03-05
  Administered 2013-03-04 – 2013-03-05 (×4): 5 mL via ORAL
  Filled 2013-03-04 (×4): qty 5

## 2013-03-04 NOTE — Progress Notes (Signed)
[facility] PROGRESS NOTE  Jamie [name] VHQ:[id number] DOB: 1965-07-27 DOA: 03/03/2013 PCP: Walker Kehr, MD  Assessment/Plan: 1. Pneumomediastinum. Likely due to rupture small bleb from multiple episodes of coughing she had last week. She has a history of vasculitis and is on chronic steroids. She was seen and evaluated by pulmonary critical care. This was felt to be unlikely related to perforated esophagus. This morning she complained of ongoing chest pain. Will continue as needed IV narcotic analgesics for pain management. Plan to repeat chest x-ray in a.m. 2. Pulmonary vasculitis. Patient will need pulmonary medicine followup with Dr Melvyn Novas as an outpatient. We will continue Imuran and prednisone. 3. Hypertension. Continue Norvasc 5 mg by mouth daily and metoprolol 25 mg by mouth twice a day  Code Status: Full Code Family Communication: I spoke with her husband present at bedside.  Disposition Plan: Continue supportive care, IV narcotic analgesics for pain control, repeat CXR in AM.    Consultants:  Pulmonary critical care  Antibiotics:  None  HPI/Subjective: Patient is a pleasant 48 year old female with a past medical history of dermatomyositis and pulmonary vasculitis, hospitalized in August of 2014 where CT scan of lungs then showed bilateral subpleural pulmonary nodules with nodular left-sided felt to have small degree of cavitary change. She was seen and evaluated by pulmonary critical care who recommended cardiothoracic surgical consultation for lung biopsy. On 10/18/12 she underwent VATS procedure performed by Dr Roxy Manns, with pathology non diagnostic. She  Was diagnosed with pneumomediastinum based on CT scan of the lungs performed on 12/10/2012. She remains on Imuran and prednisone therapy. Presented to the emergency department on 03/03/2013 with complaints of chest pain. A CT scan of lungs with contrast showed progression of pulmonary disease with increase in  pneumomediastinum from October of 2014. Patient reported having flu like illness last week, for which she had been coughing much. She was seen and evaluated by pulmonary critical care, recommending prednisone 40 mg daily, possible depression, repeat chest x-ray.   Objective: Filed Vitals:   03/04/13 1400  BP: 139/93  Pulse: 107  Temp: 98.2 F (36.8 C)  Resp: 20    Intake/Output Summary (Last 24 hours) at 03/04/13 1605 Last data filed at 03/04/13 1300  Gross per 24 hour  Intake    360 ml  Output    900 ml  Net   -540 ml   Filed Weights   03/03/13 2306  Weight: 63.821 kg (140 lb 11.2 oz)    Exam:   General:  Patient is in no acute distress, this morning complains of ongoing chest pain.  Cardiovascular: Regular rate and rhythm normal S1-S2 no murmurs rubs or gallop  Respiratory: Lungs overall clear to auscultation bilaterally no wheezing rhonchi or rales, reports chest pain with deep inspiration  Abdomen: Soft nontender nondistended  Musculoskeletal: No edema  Data Reviewed: Basic Metabolic Panel:  Recent Labs Lab 03/03/13 1930 03/04/13 0136  NA 139 141  K 3.6* 3.5*  CL 100 102  CO2  --  26  GLUCOSE 93 143*  BUN 21 15  CREATININE 0.80 0.71  CALCIUM  --  8.4   Liver Function Tests:  Recent Labs Lab 03/03/13 1905  AST 56*  ALT 54*  ALKPHOS 64  BILITOT 0.3  PROT 7.4  ALBUMIN 3.3*   No results found for this basename: LIPASE, AMYLASE,  in the last 168 hours No results found for this basename: AMMONIA,  in the last 168 hours CBC:  Recent Labs Lab 03/03/13 1905 03/03/13 1930  03/04/13 0136  WBC 10.7*  --  9.6  NEUTROABS 9.6*  --   --   HGB 13.1 13.6 12.1  HCT 37.9 40.0 36.3  MCV 93.6  --  95.0  PLT 177  --  150   Cardiac Enzymes:  Recent Labs Lab 03/04/13 0136  TROPONINI <0.30   BNP (last 3 results) No results found for this basename: PROBNP,  in the last 8760 hours CBG: No results found for this basename: GLUCAP,  in the last 168  hours  No results found for this or any previous visit (from the past 240 hour(s)).   Studies: Dg Chest 2 View  03/03/2013   CLINICAL DATA:  Cough, chest pain  EXAM: CHEST  2 VIEW  COMPARISON:  12/11/2012  FINDINGS: Subpleural reticulation/fibrosis with patchy nodularity, most prominent in the left mid lung, chronic. No focal consolidation suspicious for pneumonia. No pleural effusion or pneumothorax.  Heart is normal in size.  Mild degenerative changes of the visualized thoracolumbar spine.  IMPRESSION: Chronic subpleural reticulation/fibrosis with patchy nodularity, unchanged.  No evidence of acute cardiopulmonary disease.   Electronically Signed   By: Julian Hy M.D.   On: 03/03/2013 16:18   Ct Angio Chest Pe W/cm &/or Wo Cm  03/03/2013   CLINICAL DATA:  Worsening left-sided chest pain since this morning. History of rheumatoid vasculitis and pulmonary nodules.  EXAM: CT ANGIOGRAPHY CHEST WITH CONTRAST  TECHNIQUE: Multidetector CT imaging of the chest was performed using the standard protocol during bolus administration of intravenous contrast. Multiplanar CT image reconstructions including MIPs were obtained to evaluate the vascular anatomy.  CONTRAST:  80 mL OMNIPAQUE IOHEXOL 350 MG/ML SOLN  COMPARISON:  CT chest 10/11/2012 in 11/2018 09/2002.  FINDINGS: No pulmonary embolus is identified. There is pneumomediastinum as seen on the most recent chest CT scan. Volume of pneumomediastinum appears slightly increased. A trace amount of pleural fluid is present bilaterally.  Innumerable foci of airspace disease are identified appear worst on the prior study. Index area of airspace opacity in the right upper lobe on image 44 measures 1.9 cm by 1.2 cm in the axial plane compared to 1.0 x 1.5 cm on the prior study. Previously seen index opacity in the right lower lobe which had measured 2.4 x 1.5 cm today measures 2.7 x 1.8 cm. As on the prior examination, some of these nodular opacities are cavitary. A new  prominent area cavitation is seen along the posterior margin of the right upper lobe.  Imaged upper abdomen again demonstrates a low-attenuation lesion in the posterior right hepatic lobe which today measures approximately 2.5 by 1.6 cm compared at 3.3 x 2.0 cm. Difference in measurement may be due to technical factors. Imaged upper abdomen is otherwise unremarkable. No focal bony abnormality is identified.  Review of the MIP images confirms the above findings.  IMPRESSION: Negative for pulmonary embolus.  Interval progression of multifocal nodular opacities some of which demonstrate and crease cavitation.  Pneumomediastinum.  Critical Value/emergent results were called by telephone at the time of interpretation on 03/03/2013 at 8:06 PM to Dr. Deno Etienne , who verbally acknowledged these results.   Electronically Signed   By: Inge Rise M.D.   On: 03/03/2013 20:09    Scheduled Meds: . irbesartan  150 mg Oral Daily   And  . hydrochlorothiazide  12.5 mg Oral Daily   And  . amLODipine  5 mg Oral Daily  . azaTHIOprine  50 mg Oral Daily  . chlorpheniramine-HYDROcodone  5 mL  Oral Q12H  . enoxaparin (LOVENOX) injection  40 mg Subcutaneous Q24H  . metoprolol tartrate  25 mg Oral BID  . predniSONE  40 mg Oral Q breakfast  . sodium chloride  3 mL Intravenous Q12H   Continuous Infusions:   Principal Problem:   Pneumomediastinum Active Problems:   Pulmonary infiltrates   Rheumatoid vasculitis   Pulmonary nodules/lesions, multiple    Time spent: 35 minutes    Kelvin Cellar  [facility] Pager [phone]. If 7PM-7AM, please contact night-coverage at [email or web address], password [facility] 03/04/2013, 4:05 PM  LOS: 1 day

## 2013-03-04 NOTE — Care Management Note (Unsigned)
    Page 1 of 1   03/04/2013     2:26:00 PM   CARE MANAGEMENT NOTE 03/04/2013  Patient:  Jamie [name], Jamie [name]   Account Number:  [id number]  Date Initiated:  03/04/2013  Documentation initiated by:  Elynn Patteson  Subjective/Objective Assessment:   PT ADM ON 1/19 WITH PNEMOMEDIASTINUM.  PTA, PT INDEPENDENT, LIVES AT HOME WITH SPOUSE.     Action/Plan:   WILL FOLLOW FOR DC NEEDS AS PT PROGRESSES.   Anticipated DC Date:  03/05/2013   Anticipated DC Plan:  [place]  CM consult      Choice offered to / List presented to:             Status of service:  In process, will continue to follow Medicare Important Message given?   (If response is "NO", the following Medicare IM given date fields will be blank) Date Medicare IM given:   Date Additional Medicare IM given:    Discharge Disposition:    Per UR Regulation:  Reviewed for med. necessity/level of care/duration of stay  If discussed at [place] of Stay Meetings, dates discussed:    Comments:

## 2013-03-04 NOTE — Progress Notes (Signed)
UR completed 

## 2013-03-04 NOTE — Progress Notes (Signed)
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    SIGNIFICANT EVENTS / STUDIES:  10/18/12 - VATS bx (reviewed by Katzenstein)>>> multiple areas of parenchyma scarring.  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 In other places there are small foci of organizing pneumonia. Both of these latter features likely represent nonspecific changes of acute lung injury surrounding the process.  Specifically, I do not see thromboemboli that would support scarred infarcts nor do I see evidence of a vasculitis. 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 Some venules occluded by hyalinized fibrosis are seen within the scarred areas, and I wonder whether that finding may be a cause of the scarring or perhaps only a secondary finding. I do not see changes of eosinophilic pneumonia, nor is there granulomatous inflammation that would suggest either infection or Wegener's . I am afraid, therefore, that we are left with only a descriptive diagnosis".   1/15 - Admit with pneumomediastinum.  CT Chest: neg for PE, Interval progression of multifocal nodular opacities some of which demonstrate increase cavitation.  CULTURES:  ANTIBIOTICS:  HISTORY OF PRESENT ILLNESS:  48 y.o. y/o female with a PMH of amyopathic dermatomyositis and progressive nodular pulmonary infiltrates who began with symptoms in Jan of 2014 and has undergone a L VATS biopsy (10/2012) which was non-specific in regards to  diagnosis and had recent admission in 11/2012 for incidental finding of pneumomediastinum on a scheduled CT.  At that time, she noted an increase in her dry cough. She was discharged on 10/28 and had follow up with Cardiology in office regarding ECHO & EKG in the setting of tachycardia. Cardiology work up notedthe following:   normal LV function with no evidence of pericardial effusion. No evidence of PE on CTA August 2014. Recent normal thyroid studies. Her sinus tachycardia is likely a normal response to some other metabolic abnormality. She is also on steroids. No further cardiac testing. Titrate metoprolol to 50 mg po BID if tachycardia persists.  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  She was placed on prednisone 40 mg and imuran 50 mg BID.  Since that time, she had follow up lab work and was instructed to stop taking imuran but patient reduced to 50 mg daily because "it helped so much with the joint pain".    Patient presented to [facility] ER on 1/19 with complaints of sudden left sided chest pain while driving home from work.  She reports an increase in her dry cough over the past week.  Denies sputum production, hemoptysis, shortness of breath.  She has had recent thrush and was treated with diflucan.  She denies fevers, chills, n/v/d, weight loss.     SUBJECTIVE: No change today. chest pain persists, but pain management therapy has been adequate. She is maintaining good O2 sat on room air.   VITAL SIGNS: Temp:  [98.1 F (36.7 C)-99.4 F (37.4 C)] 98.8 F (37.1 C) (01/20 0433) Pulse Rate:  [  96-136] 120 (01/20 1011) Resp:  [17-27] 18 (01/20 0433) BP: (120-147)/(78-101) 129/78 mmHg (01/20 1011) SpO2:  [94 %-97 %] 96 % (01/20 0433) Weight:  [63.821 kg (140 lb 11.2 oz)] 63.821 kg (140 lb 11.2 oz) (01/19 2306)  PHYSICAL  EXAMINATION: General:  wdwn adult female in NAD Neuro:  AAOx4, speech clear, MAE HEENT:  Mm pink/moist, no jvd, infraorbital erythema  Cardiovascular:  s1s2 rrr, tachy Lungs:  resp's even/non-labored, lungs bilaterally with fine crackles lower posterior bilaterally. Abdomen:  Round/soft, bsx4 active Musculoskeletal:  No acute deformities Skin:  Warm/dry, scattered ulcerated, hyperpigmented nodules of varying degree on hands  CBC  Recent Labs Lab 03/03/13 1905 03/03/13 1930 03/04/13 0136  WBC 10.7*  --  9.6  HGB 13.1 13.6 12.1  HCT 37.9 40.0 36.3  PLT 177  --  150   Coag's No results found for this basename: APTT, INR,  in the last 168 hours BMET  Recent Labs Lab 03/03/13 1930 03/04/13 0136  NA 139 141  K 3.6* 3.5*  CL 100 102  CO2  --  26  BUN 21 15  CREATININE 0.80 0.71  GLUCOSE 93 143*   Electrolytes  Recent Labs Lab 03/04/13 0136  CALCIUM 8.4   Sepsis Markers No results found for this basename: LATICACIDVEN, PROCALCITON, O2SATVEN,  in the last 168 hours ABG No results found for this basename: PHART, PCO2ART, PO2ART,  in the last 168 hours Liver Enzymes  Recent Labs Lab 03/03/13 1905  AST 56*  ALT 54*  ALKPHOS 64  BILITOT 0.3  ALBUMIN 3.3*   Cardiac Enzymes  Recent Labs Lab 03/04/13 0136  TROPONINI <0.30   Glucose No results found for this basename: GLUCAP,  in the last 168 hours  Dg Chest 2 View  03/03/2013   CLINICAL DATA:  Cough, chest pain  EXAM: CHEST  2 VIEW  COMPARISON:  12/11/2012  FINDINGS: Subpleural reticulation/fibrosis with patchy nodularity, most prominent in the left mid lung, chronic. No focal consolidation suspicious for pneumonia. No pleural effusion or pneumothorax.  Heart is normal in size.  Mild degenerative changes of the visualized thoracolumbar spine.  IMPRESSION: Chronic subpleural reticulation/fibrosis with patchy nodularity, unchanged.  No evidence of acute cardiopulmonary disease.   Electronically Signed   By:  Julian Hy M.D.   On: 03/03/2013 16:18   Ct Angio Chest Pe W/cm &/or Wo Cm  03/03/2013   CLINICAL DATA:  Worsening left-sided chest pain since this morning. History of rheumatoid vasculitis and pulmonary nodules.  EXAM: CT ANGIOGRAPHY CHEST WITH CONTRAST  TECHNIQUE: Multidetector CT imaging of the chest was performed using the standard protocol during bolus administration of intravenous contrast. Multiplanar CT image reconstructions including MIPs were obtained to evaluate the vascular anatomy.  CONTRAST:  80 mL OMNIPAQUE IOHEXOL 350 MG/ML SOLN  COMPARISON:  CT chest 10/11/2012 in 11/2018 09/2002.  FINDINGS: No pulmonary embolus is identified. There is pneumomediastinum as seen on the most recent chest CT scan. Volume of pneumomediastinum appears slightly increased. A trace amount of pleural fluid is present bilaterally.  Innumerable foci of airspace disease are identified appear worst on the prior study. Index area of airspace opacity in the right upper lobe on image 44 measures 1.9 cm by 1.2 cm in the axial plane compared to 1.0 x 1.5 cm on the prior study. Previously seen index opacity in the right lower lobe which had measured 2.4 x 1.5 cm today measures 2.7 x 1.8 cm. As on the prior examination, some of these nodular opacities are  cavitary. A new prominent area cavitation is seen along the posterior margin of the right upper lobe.  Imaged upper abdomen again demonstrates a low-attenuation lesion in the posterior right hepatic lobe which today measures approximately 2.5 by 1.6 cm compared at 3.3 x 2.0 cm. Difference in measurement may be due to technical factors. Imaged upper abdomen is otherwise unremarkable. No focal bony abnormality is identified.  Review of the MIP images confirms the above findings.  IMPRESSION: Negative for pulmonary embolus.  Interval progression of multifocal nodular opacities some of which demonstrate and crease cavitation.  Pneumomediastinum.  Critical Value/emergent results  were called by telephone at the time of interpretation on 03/03/2013 at 8:06 PM to Dr. Deno Etienne , who verbally acknowledged these results.   Electronically Signed   By: Inge Rise M.D.   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 Melvyn Novas [facility]) -rheumatology follow-up with Dr. Veneta Penton [facility]) -Unclear if Imuran should be held due to elevated LFT's -continue prednisone 40 mg daily -cough suppression  -She will make an appt with rheum (Dr Ang ) on discharge    Georgann Housekeeper, [place] will be available if needed  [facility] Pulmonology/Critical Care Pager [phone] or [phone]

## 2013-03-04 NOTE — Progress Notes (Signed)
Pt had an 11 beat run of Vtach. Vital signs WDL and pt asymptomatic. RN paged MD. Orders given for a stat troponin. Will continue to monitor.  Coolidge Breeze, RN

## 2013-03-05 ENCOUNTER — Observation Stay (HOSPITAL_COMMUNITY): Payer: 59

## 2013-03-05 DIAGNOSIS — I10 Essential (primary) hypertension: Secondary | ICD-10-CM

## 2013-03-05 LAB — CBC
HEMATOCRIT: 38.9 % (ref 36.0–46.0)
Hemoglobin: 13 g/dL (ref 12.0–15.0)
MCH: 32 pg (ref 26.0–34.0)
MCHC: 33.4 g/dL (ref 30.0–36.0)
MCV: 95.8 fL (ref 78.0–100.0)
Platelets: 175 10*3/uL (ref 150–400)
RBC: 4.06 MIL/uL (ref 3.87–5.11)
RDW: 15.1 % (ref 11.5–15.5)
WBC: 10.6 10*3/uL — ABNORMAL HIGH (ref 4.0–10.5)

## 2013-03-05 LAB — BASIC METABOLIC PANEL
BUN: 12 mg/dL (ref 6–23)
CHLORIDE: 99 meq/L (ref 96–112)
CO2: 27 mEq/L (ref 19–32)
Calcium: 8.7 mg/dL (ref 8.4–10.5)
Creatinine, Ser: 0.68 mg/dL (ref 0.50–1.10)
GFR calc non Af Amer: >90 mL/min (ref >90)
Glucose, Bld: 75 mg/dL (ref 70–99)
POTASSIUM: 3.5 meq/L — AB (ref 3.7–5.3)
Sodium: 139 mEq/L (ref 137–147)

## 2013-03-05 MED ORDER — IRBESARTAN 150 MG PO TABS: 150.0000 mg | ORAL_TABLET | Freq: Every day | ORAL | Status: DC

## 2013-03-05 MED ORDER — HYDROCHLOROTHIAZIDE 12.5 MG PO CAPS: 12.5000 mg | ORAL_CAPSULE | Freq: Every day | ORAL | Status: DC

## 2013-03-05 MED ORDER — METOPROLOL TARTRATE 50 MG PO TABS: 50.0000 mg | ORAL_TABLET | Freq: Two times a day (BID) | ORAL | Status: DC

## 2013-03-05 MED ORDER — METOPROLOL TARTRATE 50 MG PO TABS
50.0000 mg | ORAL_TABLET | Freq: Two times a day (BID) | ORAL | Status: DC
  Administered 2013-03-05: 50 mg via ORAL
  Filled 2013-03-05 (×2): qty 1

## 2013-03-05 MED ORDER — HYDROCOD POLST-CHLORPHEN POLST 10-8 MG/5ML PO LQCR: 5.0000 mL | Freq: Two times a day (BID) | ORAL | Status: DC

## 2013-03-05 MED ORDER — OXYCODONE HCL 5 MG PO TABS: 5.0000 mg | ORAL_TABLET | Freq: Four times a day (QID) | ORAL | Status: DC | PRN

## 2013-03-05 NOTE — Progress Notes (Signed)
NURSING PROGRESS NOTE  Jamie [name] [id number] Discharge Data: 03/05/2013 2:12 PM Attending Provider: Jonetta Osgood, MD NAT:FTDD Plotnikov, MD     Mora Appl to be D/C'd Home per MD order.  Discussed with the patient the After Visit Summary and all questions fully answered. All IV's discontinued with no bleeding noted. All belongings returned to patient for patient to take home.   Last Vital Signs:  Blood pressure 125/91, pulse 113, temperature 98.5 F (36.9 C), temperature source Oral, resp. rate 20, height 5\' 7"  (1.702 m), weight 63.821 kg (140 lb 11.2 oz), SpO2 96.00%.  Discharge Medication List   Medication List    STOP taking these medications       Olmesartan-Amlodipine-HCTZ 20-5-12.5 MG Tabs  Commonly known as:  TRIBENZOR      TAKE these medications       azaTHIOprine 50 MG tablet  Commonly known as:  IMURAN  Take 50 mg by mouth daily.     chlorpheniramine-HYDROcodone 10-8 MG/5ML Lqcr  Commonly known as:  TUSSIONEX  Take 5 mLs by mouth every 12 (twelve) hours.     hydrochlorothiazide 12.5 MG capsule  Commonly known as:  MICROZIDE  Take 1 capsule (12.5 mg total) by mouth daily.     irbesartan 150 MG tablet  Commonly known as:  AVAPRO  Take 1 tablet (150 mg total) by mouth daily.     JUICE PLUS FIBRE PO  Take 1 capsule by mouth daily.     metoprolol 50 MG tablet  Commonly known as:  LOPRESSOR  Take 1 tablet (50 mg total) by mouth 2 (two) times daily.     oxyCODONE 5 MG immediate release tablet  Commonly known as:  Oxy IR/ROXICODONE  Take 1 tablet (5 mg total) by mouth every 6 (six) hours as needed for severe pain.     predniSONE 20 MG tablet  Commonly known as:  DELTASONE  Take 40 mg by mouth daily with breakfast.

## 2013-03-05 NOTE — Discharge Summary (Signed)
PATIENT DETAILS Name: Jamie [name] Age: 48 y.o. Sex: female Date of Birth: 11-18-1965 MRN: [id number]. Admit Date: 03/03/2013 Admitting Physician: Etta Quill, DO [id number] Plotnikov, MD  Recommendations for Outpatient Follow-up:  1. Please repeat another chest x-ray or CT chest on followup with pulmonology to assess for pneumomediastinum.  PRIMARY DISCHARGE DIAGNOSIS:  Principal Problem:   Pneumomediastinum Active Problems:   Pulmonary infiltrates   Rheumatoid vasculitis   Pulmonary nodules/lesions, multiple      PAST MEDICAL HISTORY: Past Medical History  Diagnosis Date  . Hypertension   . Multiple thyroid nodules   . History of cold sores   . 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 Photosensitivity 04/17/2012    3/14 new   . Pulmonary nodules/lesions, multiple 10/13/2012  . 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 Dysrhythmia     ST/notes 12/10/2012  . Pneumonia 02/2012    "once" (12/10/2012)  . 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 Rheumatoid arthritis     "legs; arms" (12/10/2012)  . Pneumomediastinum 03/03/2013    DISCHARGE MEDICATIONS:   Medication List    STOP taking these medications       Olmesartan-Amlodipine-HCTZ 20-5-12.5 MG Tabs  Commonly known as:  TRIBENZOR      TAKE these medications       azaTHIOprine 50 MG tablet  Commonly known as:  IMURAN  Take 50 mg by mouth daily.     chlorpheniramine-HYDROcodone 10-8 MG/5ML Lqcr  Commonly known as:  TUSSIONEX  Take 5 mLs by mouth every 12 (twelve) hours.     hydrochlorothiazide 12.5 MG capsule  Commonly known as:  MICROZIDE  Take 1 capsule (12.5 mg total) by mouth daily.     irbesartan 150 MG tablet  Commonly known as:  AVAPRO  Take 1 tablet (150 mg total) by mouth daily.     JUICE PLUS FIBRE PO  Take 1 capsule by mouth daily.      metoprolol 50 MG tablet  Commonly known as:  LOPRESSOR  Take 1 tablet (50 mg total) by mouth 2 (two) times daily.     oxyCODONE 5 MG immediate release tablet  Commonly known as:  Oxy IR/ROXICODONE  Take 1 tablet (5 mg total) by mouth every 6 (six) hours as needed for severe pain.     predniSONE 20 MG tablet  Commonly known as:  DELTASONE  Take 40 mg by mouth daily with breakfast.        ALLERGIES:   Allergies  Allergen Reactions  . Benazepril Hcl     REACTION: cough  . Tramadol     n/v  . Ciprofloxacin Rash    Joint pain REACTION: Rhabdomyolysis    BRIEF HPI:  See H&P, Labs, Consult and Test reports for all details in brief, patient is a 48 year old female with a history of dermatomyositis with pulmonary vasculitis, who presented to the ED with evaluation of left-sided chest pain. Apparently for the past several days 5 admission, patient was having persistent coughs. CTA of the chest negative for pulmonary embolism, showed pneumomediastinum. She was then admitted for further evaluation and treatment  CONSULTATIONS:   pulmonary/intensive care  PERTINENT RADIOLOGIC STUDIES: Dg Chest 2 View  03/05/2013   CLINICAL DATA:  Chest pain.  EXAM: CHEST  2 VIEW  COMPARISON:  CT chest 03/03/2013.  Chest x-ray 03/03/2013.  FINDINGS: Persistent unchanged diffuse bilateral pulmonary  nodular infiltrates are present. Small left pleural effusion. No pneumothorax. Heart size and pulmonary vascularity normal. No acute osseous abnormality.  IMPRESSION: Persistent stable severe diffuse bilateral pulmonary nodular infiltrates. Small left pleural effusion.   Electronically Signed   By: Marcello Moores  Register   On: 03/05/2013 07:35   Dg Chest 2 View  03/03/2013   CLINICAL DATA:  Cough, chest pain  EXAM: CHEST  2 VIEW  COMPARISON:  12/11/2012  FINDINGS: Subpleural reticulation/fibrosis with patchy nodularity, most prominent in the left mid lung, chronic. No focal consolidation suspicious for pneumonia. No  pleural effusion or pneumothorax.  Heart is normal in size.  Mild degenerative changes of the visualized thoracolumbar spine.  IMPRESSION: Chronic subpleural reticulation/fibrosis with patchy nodularity, unchanged.  No evidence of acute cardiopulmonary disease.   Electronically Signed   By: Julian Hy M.D.   On: 03/03/2013 16:18   Ct Angio Chest Pe W/cm &/or Wo Cm  03/03/2013   CLINICAL DATA:  Worsening left-sided chest pain since this morning. History of rheumatoid vasculitis and pulmonary nodules.  EXAM: CT ANGIOGRAPHY CHEST WITH CONTRAST  TECHNIQUE: Multidetector CT imaging of the chest was performed using the standard protocol during bolus administration of intravenous contrast. Multiplanar CT image reconstructions including MIPs were obtained to evaluate the vascular anatomy.  CONTRAST:  80 mL OMNIPAQUE IOHEXOL 350 MG/ML SOLN  COMPARISON:  CT chest 10/11/2012 in 11/2018 09/2002.  FINDINGS: No pulmonary embolus is identified. There is pneumomediastinum as seen on the most recent chest CT scan. Volume of pneumomediastinum appears slightly increased. A trace amount of pleural fluid is present bilaterally.  Innumerable foci of airspace disease are identified appear worst on the prior study. Index area of airspace opacity in the right upper lobe on image 44 measures 1.9 cm by 1.2 cm in the axial plane compared to 1.0 x 1.5 cm on the prior study. Previously seen index opacity in the right lower lobe which had measured 2.4 x 1.5 cm today measures 2.7 x 1.8 cm. As on the prior examination, some of these nodular opacities are cavitary. A new prominent area cavitation is seen along the posterior margin of the right upper lobe.  Imaged upper abdomen again demonstrates a low-attenuation lesion in the posterior right hepatic lobe which today measures approximately 2.5 by 1.6 cm compared at 3.3 x 2.0 cm. Difference in measurement may be due to technical factors. Imaged upper abdomen is otherwise unremarkable. No  focal bony abnormality is identified.  Review of the MIP images confirms the above findings.  IMPRESSION: Negative for pulmonary embolus.  Interval progression of multifocal nodular opacities some of which demonstrate and crease cavitation.  Pneumomediastinum.  Critical Value/emergent results were called by telephone at the time of interpretation on 03/03/2013 at 8:06 PM to Dr. Deno Etienne , who verbally acknowledged these results.   Electronically Signed   By: Inge Rise M.D.   On: 03/03/2013 20:09   Mm Digital Diagnostic Bilat  02/25/2013   CLINICAL DATA:  Patient returns for short interval follow-up of calcifications in the right breast and annual exam on the left.  EXAM: DIGITAL DIAGNOSTIC  BILATERAL MAMMOGRAM WITH CAD  COMPARISON:  11/19/2012, 03/14/2012, 02/12/2012, 02/01/2011  ACR Breast Density Category c: The breast tissue is heterogeneously dense, which may obscure small masses.  FINDINGS: There is no suspicious dominant mass or architectural distortion to suggest malignancy. Calcifications in the right breast layer consistent with benign milk of calcium.  Mammographic images were processed with CAD.  IMPRESSION: No mammographic evidence of  malignancy.  RECOMMENDATION: Yearly screening mammography is suggested.  I have discussed the findings and recommendations with the patient. Results were also provided in writing at the conclusion of the visit. If applicable, a reminder letter will be sent to the patient regarding the next appointment.  BI-RADS CATEGORY  2: Benign Finding(s)   Electronically Signed   By: Ulyess Blossom M.D.   On: 02/25/2013 15:35     PERTINENT LAB RESULTS: CBC:  Recent Labs  03/04/13 0136 03/05/13 0445  WBC 9.6 10.6*  HGB 12.1 13.0  HCT 36.3 38.9  PLT 150 175   CMET CMP     Component Value Date/Time   NA 139 03/05/2013 0445   K 3.5* 03/05/2013 0445   CL 99 03/05/2013 0445   CO2 27 03/05/2013 0445   GLUCOSE 75 03/05/2013 0445   BUN 12 03/05/2013 0445    CREATININE 0.68 03/05/2013 0445   CALCIUM 8.7 03/05/2013 0445   PROT 7.4 03/03/2013 1905   ALBUMIN 3.3* 03/03/2013 1905   AST 56* 03/03/2013 1905   ALT 54* 03/03/2013 1905   ALKPHOS 64 03/03/2013 1905   BILITOT 0.3 03/03/2013 1905   GFRNONAA >90 03/05/2013 0445   GFRAA >90 03/05/2013 0445    GFR Estimated Creatinine Clearance: 83.6 ml/min (by C-G formula based on Cr of 0.68). No results found for this basename: LIPASE, AMYLASE,  in the last 72 hours  Recent Labs  03/04/13 0136  TROPONINI <0.30   No components found with this basename: POCBNP,  No results found for this basename: DDIMER,  in the last 72 hours No results found for this basename: HGBA1C,  in the last 72 hours No results found for this basename: CHOL, HDL, LDLCALC, TRIG, CHOLHDL, LDLDIRECT,  in the last 72 hours No results found for this basename: TSH, T4TOTAL, FREET3, T3FREE, THYROIDAB,  in the last 72 hours No results found for this basename: VITAMINB12, FOLATE, FERRITIN, TIBC, IRON, RETICCTPCT,  in the last 72 hours Coags: No results found for this basename: PT, INR,  in the last 72 hours Microbiology: No results found for this or any previous visit (from the past 240 hour(s)).   BRIEF HOSPITAL COURSE:   Principal Problem:   Pneumomediastinum - As noted on history of present illness, patient has had multiple coughing spells for almost a week prior to this admission. 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 She is on chronic steroids and Imuran therapy. A CT angiogram of the chest was negative for pulmonary embolism, however it was positive for pneumomediastinum. Given presentation with chest pain, she was admitted and monitored in the telemetry unit. Initial cardiac enzymes were negative. Pulmonology was consulted, apart from supportive care and repeat chest x-ray, they had no further input. Repeat chest x-ray did not show any significant pneumomediastinum. Her cough is significantly  better with supportive care. Suspect she is stable enough to be discharged home today, on discharge she will continue on Tussionex. She continues to have some mild left scapular area chest pain that is completely reproducible with palpation, suspect musculoskeletal etiology from significant coughing. - I have made her a followup appointment with pulmonology-see below. She has been asked to keep this appointment.  History of Amyopathic dermatomyositis and Rheumatoid arthritis - Continue with prednisone and Imuran. Apparently, patient has been asked by both her pulmonologist and rheumatologist to stop Imuran given mild LFT elevation. However patient has only decreased the dose to 50 mg, and wishes to continue on it because  it really helps her with her arthritis. I attempted to make her a followup appointment with Dr. Veneta Penton- rheumatologist at [facility], patient already has a previously scheduled appointment on 03/28/13, unfortunately the office is not able to make her earlier appointment, and he will call her if any response to come available.  Mild transaminitis - See above, suspected from Imuran. - Please check LFTs periodically as outpatient, defer rheumatology and patient whether or not to continue Imuran  History of sinus tachycardia - This is a chronic issue for the patient. On 1/21 heart rate was noted to be in the low 100s. CT chest on admission negative for PE. Recent echocardiogram done a few months ago was also negative. She has had cardiology evaluations for this in the past few months as well. I am increasing her metoprolol to 50 mg twice a day, and stopping amlodipine. TSH on 8/30 was within normal range, and was not repeated during this hospital stay.  Hypertension - Previously on a combination of amlodipine HCTZ and Olmesartan, because metoprolol was increased to 50 mg twice a day, we have discontinued amlodipine. Further optimization can be done in the outpatient setting.   TODAY-DAY OF  DISCHARGE:  Subjective:   Shala Kolasinski today has no headache,no chest abdominal pain,no new weakness tingling or numbness, feels much better wants to go home today.   Objective:   Blood pressure 123/80, pulse 105, temperature 99.6 F (37.6 C), temperature source Oral, resp. rate 20, height 5\' 7"  (1.702 m), weight 63.821 kg (140 lb 11.2 oz), SpO2 97.00%.  Intake/Output Summary (Last 24 hours) at 03/05/13 1203 Last data filed at 03/05/13 1040  Gross per 24 hour  Intake    600 ml  Output   1150 ml  Net   -550 ml   Filed Weights   03/03/13 2306  Weight: 63.821 kg (140 lb 11.2 oz)    Exam Awake Alert, Oriented *3, No new F.N deficits, Normal affect [place].AT,PERRAL Supple Neck,No JVD, No cervical lymphadenopathy appriciated.  Symmetrical Chest wall movement, Good air movement bilaterally, CTAB RRR,No Gallops,Rubs or new Murmurs, No Parasternal Heave +ve B.Sounds, Abd Soft, Non tender, No organomegaly appriciated, No rebound -guarding or rigidity. No Cyanosis, Clubbing or edema, No new Rash or bruise  DISCHARGE CONDITION: Stable  DISPOSITION: Home  DISCHARGE INSTRUCTIONS:    Activity:  As tolerated   Diet recommendation: Heart Healthy diet      Discharge Orders   Future Appointments Provider Department Dept Phone   03/13/2013 9:30 AM Tanda Rockers, MD [facility] [phone]   Future Orders Complete By Expires   Call MD for:  difficulty breathing, headache or visual disturbances  As directed    Call MD for:  severe uncontrolled pain  As directed    Diet - low sodium heart healthy  As directed    Increase activity slowly  As directed       Follow-up Information   Follow up with Ang, Abigail Butts, MD On 03/28/2013. (please keep this appointment for now, the clinic will call you for an earlier appointmentif one becomes available)    Specialty:  Internal Medicine   Contact information:   [place] [address] [phone]        Follow up with Walker Kehr, MD. Schedule an appointment as soon as possible for a visit in 1 week.   Specialty:  Internal Medicine   Contact information:   [address] [place]  [place] [address] [phone]       Follow up with Christinia Gully, MD. Schedule an appointment as soon as possible for a visit on 03/13/2013. (appointment at 9:30 am)    Specialty:  Pulmonary Disease   Contact information:   [address] [place] [address] [phone]         Total Time spent on discharge equals 45 minutes.  SignedOren Binet 03/05/2013 12:03 PM

## 2013-03-09 ENCOUNTER — Emergency Department (HOSPITAL_BASED_OUTPATIENT_CLINIC_OR_DEPARTMENT_OTHER)
Admission: EM | Admit: 2013-03-09 | Discharge: 2013-03-09 | Disposition: A | Discharge status: Home / Self Care | Payer: 59 | Attending: Emergency Medicine | Admitting: Emergency Medicine

## 2013-03-09 ENCOUNTER — Emergency Department (HOSPITAL_BASED_OUTPATIENT_CLINIC_OR_DEPARTMENT_OTHER): Payer: 59

## 2013-03-09 ENCOUNTER — Encounter (HOSPITAL_BASED_OUTPATIENT_CLINIC_OR_DEPARTMENT_OTHER): Payer: Self-pay | Admitting: Emergency Medicine

## 2013-03-09 DIAGNOSIS — Z79899 Other long term (current) drug therapy: Secondary | ICD-10-CM | POA: Insufficient documentation

## 2013-03-09 DIAGNOSIS — M069 Rheumatoid arthritis, unspecified: Secondary | ICD-10-CM | POA: Insufficient documentation

## 2013-03-09 DIAGNOSIS — Z862 Personal history of diseases of the blood and blood-forming organs and certain disorders involving the immune mechanism: Secondary | ICD-10-CM | POA: Insufficient documentation

## 2013-03-09 DIAGNOSIS — Z8639 Personal history of other endocrine, nutritional and metabolic disease: Secondary | ICD-10-CM | POA: Insufficient documentation

## 2013-03-09 DIAGNOSIS — I10 Essential (primary) hypertension: Secondary | ICD-10-CM | POA: Insufficient documentation

## 2013-03-09 DIAGNOSIS — Z8701 Personal history of pneumonia (recurrent): Secondary | ICD-10-CM | POA: Insufficient documentation

## 2013-03-09 DIAGNOSIS — I776 Arteritis, unspecified: Secondary | ICD-10-CM | POA: Insufficient documentation

## 2013-03-09 DIAGNOSIS — Z8619 Personal history of other infectious and parasitic diseases: Secondary | ICD-10-CM | POA: Insufficient documentation

## 2013-03-09 DIAGNOSIS — M0520 Rheumatoid vasculitis with rheumatoid arthritis of unspecified site: Secondary | ICD-10-CM

## 2013-03-09 DIAGNOSIS — L98499 Non-pressure chronic ulcer of skin of other sites with unspecified severity: Secondary | ICD-10-CM | POA: Insufficient documentation

## 2013-03-09 DIAGNOSIS — L98419 Non-pressure chronic ulcer of buttock with unspecified severity: Secondary | ICD-10-CM

## 2013-03-09 DIAGNOSIS — M0510 Rheumatoid lung disease with rheumatoid arthritis of unspecified site: Secondary | ICD-10-CM

## 2013-03-09 DIAGNOSIS — R0781 Pleurodynia: Secondary | ICD-10-CM

## 2013-03-09 DIAGNOSIS — R071 Chest pain on breathing: Secondary | ICD-10-CM | POA: Insufficient documentation

## 2013-03-09 DIAGNOSIS — IMO0002 Reserved for concepts with insufficient information to code with codable children: Secondary | ICD-10-CM | POA: Insufficient documentation

## 2013-03-09 MED ORDER — DOXYCYCLINE HYCLATE 100 MG PO CAPS: 100.0000 mg | ORAL_CAPSULE | Freq: Two times a day (BID) | ORAL | Status: DC

## 2013-03-09 NOTE — ED Provider Notes (Signed)
CSN: [id number]     Arrival date & time 03/09/13  1319 History   First MD Initiated Contact with Patient 03/09/13 1337     Chief Complaint  Patient presents with  . Chest Pain   (Consider location/radiation/quality/duration/timing/severity/associated sxs/prior Treatment) Patient is a 48 y.o. female presenting with chest pain.  Chest Pain  Pt with complex history of rheumatoid arthritis and vasculitis recently admitted for pneumomediastinum after a coughing spell thought to be from ruptured bleb, was discharged a few days ago, doing well until last night began to have moderate to severe aching pleuritic pain in L lower chest, radiating around to L scapula. Worse with deep breath, coughing has improved.   She also reports she has developed a weeping sore on her sacrum, moderate pain, worse with sitting.   She was taking Imuran with good symptom relief until recently when she began to have elevated liver enzymes.    Past Medical History  Diagnosis Date  . Hypertension   . Multiple thyroid nodules   . History of cold sores   . 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 Photosensitivity 04/17/2012    3/14 new   . Pulmonary nodules/lesions, multiple 10/13/2012  . 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 Dysrhythmia     ST/notes 12/10/2012  . Pneumonia 02/2012    "once" (12/10/2012)  . 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 Rheumatoid arthritis     "legs; arms" (12/10/2012)  . Pneumomediastinum 03/03/2013   Past Surgical History  Procedure Laterality Date  . Video bronchoscopy Left 10/18/2012    Procedure: VIDEO BRONCHOSCOPY;  Surgeon: Rexene Alberts, MD;  Location: [place];  Service: Thoracic;  Laterality: Left;  . Video assisted thoracoscopy Left 10/18/2012    Procedure: VIDEO ASSISTED THORACOSCOPY;  Surgeon: Rexene Alberts, MD;  Location: [place];  Service: Thoracic;  Laterality: Left;  . Lung biopsy Left 10/18/2012    Procedure: LUNG BIOPSY;  Surgeon: Rexene Alberts, MD;  Location: [facility];  Service: Thoracic;  Laterality: Left;  . Abdominal hysterectomy  ~ 2006   Family History  Problem Relation Age of Onset  . Hypertension Other   . Hypertension Mother   . Cancer Father 33    prostate ca   History  Substance Use Topics  . Smoking status: Never Smoker   . Smokeless tobacco: Never Used  . Alcohol Use: No   OB History   Grav Para Term Preterm Abortions TAB SAB Ect Mult Living                 Review of Systems  Cardiovascular: Positive for chest pain.   All other systems reviewed and are negative except as noted in HPI.   Allergies  Benazepril hcl; Tramadol; and Ciprofloxacin  Home Medications   Current Outpatient Rx  Name  Route  Sig  Dispense  Refill  . ondansetron (ZOFRAN) 4 MG tablet   Oral   Take 4 mg by mouth every 8 (eight) hours as needed for nausea or vomiting.         [name] azaTHIOprine (IMURAN) 50 MG tablet   Oral   Take 50 mg by mouth daily.          . chlorpheniramine-HYDROcodone (TUSSIONEX) 10-8 MG/5ML LQCR   Oral   Take 5 mLs by mouth every 12 (twelve) hours.   115 mL  0   . hydrochlorothiazide (MICROZIDE) 12.5 MG capsule   Oral   Take 1 capsule (12.5 mg total) by mouth daily.   30 capsule   0   . irbesartan (AVAPRO) 150 MG tablet   Oral   Take 1 tablet (150 mg total) by mouth daily.   30 tablet   0   . metoprolol tartrate (LOPRESSOR) 50 MG tablet   Oral   Take 1 tablet (50 mg total) by mouth 2 (two) times daily.   60 tablet   0   . Nutritional Supplements (JUICE PLUS FIBRE PO)   Oral   Take 1 capsule by mouth daily.         [name] oxyCODONE (OXY IR/ROXICODONE) 5 MG immediate release tablet   Oral   Take 1 tablet (5 mg total) by mouth every 6 (six) hours as needed for severe pain.   20 tablet   0   . predniSONE (DELTASONE) 20 MG tablet   Oral   Take 40 mg by mouth  daily with breakfast.          BP 113/85  Pulse 111  Temp(Src) 98.6 F (37 C) (Oral)  Resp 18  Ht 5\' 7"  (1.702 m)  Wt 140 lb (63.504 kg)  BMI 21.92 kg/m2  SpO2 98% Physical Exam  Nursing note and vitals reviewed. Constitutional: She is oriented to person, place, and time. She appears well-developed and well-nourished.  HENT:  Head: Normocephalic and atraumatic.  Eyes: EOM are normal. Pupils are equal, round, and reactive to light.  Neck: Normal range of motion. Neck supple.  Cardiovascular: Normal rate, normal heart sounds and intact distal pulses.   Pulmonary/Chest: Effort normal and breath sounds normal. She has no wheezes. She has no rales.  No Haman's crunch  Abdominal: Bowel sounds are normal. She exhibits no distension. There is no tenderness.  Musculoskeletal: Normal range of motion. She exhibits no edema and no tenderness.  Neurological: She is alert and oriented to person, place, and time. She has normal strength. No cranial nerve deficit or sensory deficit.  Skin: Skin is warm and dry.  Numerous dry vasculitis lesions on hands, also has a larger, weeping lesion on superior gluteal cleft, ?pus drainage and surrounding induration  Psychiatric: She has a normal mood and affect.    ED Course  Procedures (including critical care time) Labs Review Labs Reviewed - No data to display Imaging Review Dg Chest 2 View  03/09/2013   CLINICAL DATA:  Pain.  EXAM: CHEST  2 VIEW  COMPARISON:  Chest x-ray 03/05/2013 and 10/18/2012.  CT 03/03/2013.  FINDINGS: Mediastinum and hilar structures are normal. Persistent bilateral pulmonary opacities are noted. These have not improved. Heart size is stable. No pulmonary venous congestion. Stable elevation left hemidiaphragm. Small left pleural effusion may be present. No pneumothorax. Degenerative changes thoracic spine.  IMPRESSION: Persistent patchy/nodular severe bilateral pulmonary infiltrates. No interim clearing.   Electronically Signed    By: [place]   On: 03/09/2013 14:14    EKG Interpretation   None       MDM   1. Pulmonary vasculitis associated with rheumatoid arthritis   2. Pleuritic chest pain   3. Skin ulcer of buttock     CXR images reviewed and unchanged. Discussed briefly with Dr. Lamonte Sakai, on call for Pulm regarding expected course for pneumomediastinum, he states not uncommon to still be symptomatic. Also discussed with Dr. Pennie Banter, on call for Rheum at [facility], who does not recommend any change in  Prednisone today but will pass message to the patient's rheumatologist to facilitate close followup. Pt has pain medications at home. Advised to return for any worsening pain, SOB, fever, cough or for any other concerns.     Oluwademilade Kellett B. Karle Starch, MD 03/09/13 [phone]

## 2013-03-09 NOTE — Discharge Instructions (Signed)
Vasculitis Vasculitis is when your blood vessels are inflamed. There are many different blood vessels in the body, and vasculitis can affect any of them. This includes large (veins and arteries) and small (capillaries) vessels. With vasculitis,   Blood vessel walls can become thick.  Blood vessels can become narrow.  Blood vessels can become weak. Sometimes, it becomes so weak that the blood vessel bulges out like a balloon. This is called an aneurysm. Aneurysms are rare but can be life-threatening.  Scarring can occur.  Not enough blood can flow through the blood vessels. All of these things can damage many parts of the body, including the muscles, kidneys, lungs and brain. There are many types of vasculitis. Some types are short-term (acute), while others are long-term (chronic). Some types may go away without treatment, and others may need to be treated for a long time.  CAUSES  Vasculitis occurs when the body's immune system (which fights germs and disease) makes a mistake. It attacks its own blood vessels. This causes inflammation (the body's way of reacting to injury or infection).   Why this happens is usually not known. The condition is then called primary vasculitis.  Sometimes, something triggers the inflammation. This is called secondary vasculitis. Possible causes include:  Infections.  An immune system disease. Examples include lupus, rheumatoid arthritis and scleroderma.  An allergic reaction to a medicine.  Cancer that affects blood cells. This includes leukemia and lymphoma.  Males and females of all ages and races can develop vasculitis. Some risk factors make vasculitis more likely. These include:  Smoking.  Stress.  Physical injury. SYMPTOMS  There are more than 20 types of vasculitis. Symptoms of each type vary, but some symptoms are common.  Many people with vasculitis:  Have a fever.  Do not feel like eating.  Lose weight.  Feel very tired.  Have  aches and pains.  Feel weak.  Start to not have feeling (numbness) in an area.  Symptoms for some types of vasculitis also could be:  Sores in the mouth or eyes.  Skin problems. This could be sores, spots or rashes.  Trouble seeing.  Trouble breathing.  Blood in the urine.  Headaches.  Pain in the abdomen.  Stuffy or bloody nose. DIAGNOSIS  Vasculitis symptoms are similar to symptoms of many other conditions. That can make it hard to tell if you have vasculitis. To be sure, your caregiver will ask about your symptoms and do a physical exam. Certain tests may be necessary, such as:   A complete blood count (CBC). This test shows how many red blood cells are in your blood. Not having enough red blood cells (anemic) can result from vasculitis.  Erythrocyte sedimentation (also called sed rate test). It measures inflammation in the body.  C reactive protein (CRP). This also shows if there is inflammation.  Anti-neutrophil cytoplasmic antibodies (ANCA). This can tell if the immune system is reacting to certain cells in the blood.  A urine test. This checks for blood or protein in the urine. That could be a sign of kidney damage from vasculitis.  Imaging tests. These tests create pictures from inside the body. Options include:  X-rays.  Computed tomography (CT) scan. This uses X-rays guided by a computer.  Ultrasound. It creates an image using sound waves.  Magnetic resonance imaging (MRI). It uses radio waves, magnets and a computer.  Angiography. A dye is put into your blood vessels. Then, an X-ray is taken of them.  A biopsy of   a blood vessel. This means your caregiver will take out a small piece of a blood vessel. Then, it is checked under a microscope. This is an important test. It often is the best way to know for sure if you have vasculitis. TREATMENT   Treatment will depend on the type of vasculitis and how severe it is. Often, you will need to see a specialist in  immunologic diseases (rheumatologist).  Some types of vasculitis may go away without treatment.  Some types need only over-the-counter drugs.  Prescription medicines are used to treat many types of vasculitis. For example:  Corticosteroids. These are the drugs used most often. They are very powerful. Usually, a high dose is taken until symptoms improve. Then, the dose is gradually decreased. Using corticosteroids for a long time can cause problems. They can make muscles and bones weak. They can cause blood pressure to go up, and cause diabetes. Also, people often gain weight when they take corticosteroids.  Cytotoxic drugs. These kill cells that cause inflammation. Sometimes, they are used if corticosteroids do not help. Other times, both medications are taken.  Surgery. This may be needed to repair a blood vessel that has bulged out (aneurysm).  Treatment can sometimes cure your disease. Other times, it can put the disease in remission (no symptoms). Increased treatment and reevaluation might be necessary if your disease comes back or flares. HOME CARE INSTRUCTIONS   Take any medications that your caregiver prescribes. Follow the directions carefully.  Watch for any problems that can be caused by a drug (side effects). Tell your caregiver right away if you notice any changes or problems.  Keep all appointments for checkups. This is important to help your caregiver watch for side effects. Checkups may include:  Periodic blood tests.  Bone density testing. This checks how strong or weak your bones are.  Blood pressure checks. If your blood pressure rises, you may need to take a drug to control it while you are taking corticosteroids.  Blood sugar checks. This is to be sure you are not developing diabetes. If you have diabetes, corticosteroid medications may make it worse and require increased treatment.  Exercise. First, talk with your caregiver about what would be OK for you to do.  Aerobic exercise (which increases your heart rate) is often suggested. It includes walking. This type of exercise is good because it helps prevent bone loss. It also helps control your blood pressure.  Follow a healthy diet. Include good sources of protein in your diet. Also include fruits, vegetables and whole grains. Your caregiver can refer you to an expert on healthy eating (dietitian) for more detailed advice.  Learn as much as you can about vasculitis. Understanding your condition can help you cope with it. Coping can be hard because this may be something you will have to live with for years.  Consider joining a support group. It often helps to talk about your worries with others who have the same problems.  Tell your caregiver if you feel stressed, anxious or depressed. Your caregiver may refer you to a specialist, or recommend medication to relieve your symptoms. SEEK MEDICAL CARE IF:   The symptoms that led to your diagnosis return.  You develop worsening fever, fatigue, headache, weight loss or pain in your jaw.  You develop signs of infection. Infections can be worse if you are on corticosteroid medication.  You develop any new or unexplained symptoms of disease. SEEK IMMEDIATE MEDICAL CARE IF:   Your eyesight changes.    Pain does not go away, even after taking medication.  You feel pain in your chest or abdomen.  You have trouble breathing.  One side of your face or body becomes suddenly weak or numb.  Your nose bleeds.  There is blood in your urine.  You develop a fever of more than 102 F (38.9 C). Document Released: 11/26/2008 Document Revised: 04/24/2011 Document Reviewed: 11/26/2008 Exit[employer] Patient Information 2014 [employer].  

## 2013-03-09 NOTE — ED Notes (Addendum)
Patient c/o left thoracic pain, radiating to mid left back. Patient states that in Sept she had a nodule rupture in her right lung and had free air. Was admitted to [facility] overnight. Patient states that this feels the same. Pt has amyopathic dermatomyositis, dx at [facility]. Also has an open ulcer on her right buttock that she is concerned about.

## 2013-03-13 ENCOUNTER — Ambulatory Visit (INDEPENDENT_AMBULATORY_CARE_PROVIDER_SITE_OTHER): Payer: 59 | Admitting: Internal Medicine

## 2013-03-13 ENCOUNTER — Encounter: Payer: Self-pay | Admitting: Internal Medicine

## 2013-03-13 VITALS — BP 120/84 | HR 80 | Temp 97.7°F | Resp 16 | Wt 136.0 lb

## 2013-03-13 VITALS — BP 108/72 | HR 100 | Temp 97.4°F | Ht 67.0 in | Wt 134.6 lb

## 2013-03-13 DIAGNOSIS — L98429 Non-pressure chronic ulcer of back with unspecified severity: Secondary | ICD-10-CM | POA: Insufficient documentation

## 2013-03-13 DIAGNOSIS — R059 Cough, unspecified: Secondary | ICD-10-CM

## 2013-03-13 DIAGNOSIS — J982 Interstitial emphysema: Secondary | ICD-10-CM

## 2013-03-13 DIAGNOSIS — L959 Vasculitis limited to the skin, unspecified: Secondary | ICD-10-CM

## 2013-03-13 DIAGNOSIS — L89109 Pressure ulcer of unspecified part of back, unspecified stage: Secondary | ICD-10-CM

## 2013-03-13 DIAGNOSIS — L988 Other specified disorders of the skin and subcutaneous tissue: Secondary | ICD-10-CM

## 2013-03-13 DIAGNOSIS — R05 Cough: Secondary | ICD-10-CM

## 2013-03-13 MED ORDER — MUPIROCIN 2 % EX OINT: TOPICAL_OINTMENT | CUTANEOUS | Status: DC

## 2013-03-13 MED ORDER — PANTOPRAZOLE SODIUM 40 MG PO TBEC: 40.0000 mg | DELAYED_RELEASE_TABLET | Freq: Every day | ORAL | Status: DC

## 2013-03-13 MED ORDER — FAMOTIDINE 20 MG PO TABS: ORAL_TABLET | ORAL | Status: DC

## 2013-03-13 NOTE — Progress Notes (Signed)
Pre visit review using our clinic review tool, if applicable. No additional management support is needed unless otherwise documented below in the visit note. 

## 2013-03-13 NOTE — Patient Instructions (Addendum)
Take delsym two tsp every 12 hours and supplement if needed with oxy ir 4 hours to suppress the urge to cough. Swallowing water or using ice chips/non mint and menthol containing candies (such as lifesavers or sugarless jolly ranchers) are also effective.  You should rest your voice and avoid activities that you know make you cough.  Once you have eliminated the cough for 3 straight days try reducing the oxy ir first,  then the delsym as tolerated.    Pantoprazole (protonix) 40 mg   Take 30-60 min before first meal of the day and Pepcid 20 mg one bedtime until no longer needing cough medications  GERD (REFLUX)  is an extremely common cause of respiratory symptoms, many times with no significant heartburn at all.    It can be treated with medication, but also with lifestyle changes including avoidance of late meals, excessive alcohol, smoking cessation, and avoid fatty foods, chocolate, peppermint, colas, red wine, and acidic juices such as orange juice.  NO MINT OR MENTHOL PRODUCTS SO NO COUGH DROPS  USE SUGARLESS CANDY INSTEAD (jolley ranchers or Stover's)  NO OIL BASED VITAMINS - use powdered substitutes.    Strongly recommend you get regular follow up with rheumatology and pulmonary at [facility]

## 2013-03-13 NOTE — Assessment & Plan Note (Addendum)
1/15 likely vasculitis related On Vibramycin po Bactroban oint tid Stay off work. RTC 2 wks. We may need to ref to Wound Clinic

## 2013-03-13 NOTE — Assessment & Plan Note (Signed)
Not typical of ILD, does not occur with inspiration/ worse at hs severe and complicated by pneumomediastinum >>>   more likely this is an unrelated form of  Upper airway cough syndrome, so named because it's frequently impossible to sort out how much is  CR/sinusitis with freq throat clearing (which can be related to primary GERD)   vs  causing  secondary (" extra esophageal")  GERD from wide swings in gastric pressure that occur with throat clearing, often  promoting self use of mint and menthol lozenges that reduce the lower esophageal sphincter tone and exacerbate the problem further in a cyclical fashion.   These are the same pts (now being labeled as having "irritable larynx syndrome" by some cough centers) who not infrequently have a history of having failed to tolerate ace inhibitors,  dry powder inhalers or biphosphonates or report having atypical reflux symptoms that don't respond to standard doses of PPI , and are easily confused as having aecopd or asthma flares by even experienced allergists/ pulmonologists.   For now try to eliminate cyclical cough / suppress acid > prefer all pulmonary f/u at [facility] but we can see her back here prn

## 2013-03-13 NOTE — Progress Notes (Signed)
Patient ID: Jamie [name], female   DOB: 1965/12/07, 48 y.o.   MRN: [id number]   Subjective:   HPI  ER f/up:  03/09/13:  1.  Pulmonary vasculitis associated with rheumatoid arthritis    2.  Pleuritic chest pain    3.  Skin ulcer of buttock    CXR images reviewed and unchanged. Discussed briefly with Dr. Lamonte Sakai, on call for Pulm regarding expected course for pneumomediastinum, he states not uncommon to still be symptomatic. Also discussed with Dr. Pennie Banter, on call for Rheum at [facility], who does not recommend any change in Prednisone today but will pass message to the patient's rheumatologist to facilitate close followup. Pt has pain medications at home. Advised to return for any worsening pain, SOB, fever, cough or for any other concerns.  Jamie B. Karle Starch, MD  03/09/13 1518      Hx: Pulmonary nodules / Rheumatoid vasculitis / Rash and nonspecific skin eruption  Sinus Tachycardia  Arthralgia  Mild hypokalemia   Review of Systems  Constitutional: Positive for fatigue. Negative for fever, chills, activity change, appetite change and unexpected weight change.  HENT: Negative for congestion, mouth sores and sinus pressure.   Eyes: Negative for visual disturbance.  Respiratory: Negative for cough and chest tightness.   Gastrointestinal: Negative for nausea and abdominal pain.  Genitourinary: Negative for frequency, difficulty urinating and vaginal pain.  Musculoskeletal: Positive for arthralgias and back pain. Negative for gait problem.  Skin: Positive for rash. Negative for color change, pallor and wound.  Neurological: Negative for dizziness, tremors, weakness, numbness and headaches.  Psychiatric/Behavioral: Negative for suicidal ideas, confusion and sleep disturbance. The patient is not nervous/anxious.        Objective:   Physical Exam  Constitutional: She appears well-developed. No distress.  HENT:  Head: Normocephalic.  Right Ear: External ear normal.  Left Ear:  External ear normal.  Nose: Nose normal.  Mouth/Throat: Oropharynx is clear and moist.  Eyes: Conjunctivae are normal. Pupils are equal, round, and reactive to light. Right eye exhibits no discharge. Left eye exhibits no discharge.  Neck: Normal range of motion. Neck supple. No JVD present. No tracheal deviation present. No thyromegaly present.  Cardiovascular: Normal rate, regular rhythm and normal heart sounds.   Pulmonary/Chest: No stridor. No respiratory distress. She has no wheezes.  Abdominal: Soft. Bowel sounds are normal. She exhibits no distension and no mass. There is no tenderness. There is no rebound and no guarding.  Musculoskeletal: She exhibits no edema and no tenderness.  Lymphadenopathy:    She has no cervical adenopathy.  Neurological: She displays normal reflexes. No cranial nerve deficit. She exhibits normal muscle tone. Coordination normal.  Skin: Rash noted. No erythema.  Small necrotic ulcers on palms and fingers  Psychiatric: She has a normal mood and affect. Her behavior is normal. Judgment and thought content normal.  There is an ulcer in the coccyx area between buttocks 20x10x1 mm Jamie [name] is present again      Assessment & Plan:

## 2013-03-13 NOTE — Assessment & Plan Note (Signed)
1-1.5

## 2013-03-13 NOTE — Progress Notes (Signed)
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 Pna s/p vats bx 10/18/12 for nodular lung dz ? Etiology with no specific dx per [facility]    History of Present Illness  03/08/2012 1st [name] eval cc new onset abrupt dry cough and sob 02/27/11  in setting of polyarthralgia x 6 weeks already on prednisone and was being tapered down because arthritis was getting some better  when resp symptoms flare and no better with symbicort.  Started on augmentin 1/23 with chills 1/19  And temp to 101 nothing since then.   rec GERD diet Use tussionex for cough Finish antibiotics   04/04/2012 f/u ov/Jamie [name] cc much better, no fever, cough some better but still very hoarse and looses breath when talking but otherwise feeling fine, finished pred x one week with no flare of cough, doe or arthritis rec Stop micardis Start bystolic 5 mg twice daily If  cough worse:  Delsym cough syrup Try prilosec 20mg   Take 30-60 min before first meal of the day and Pepcid 20 mg one bedtime until cough and hoarsenss is completely gone for at least a week without the need for cough suppression [name] is as needed if cough not improving or breathing worse   Arthritis assoc with rash, better with prednisone, Dr Jamie [name] thought it was RA with rec mtx , then abrupt R CP > admit with MPN's and bx 9/5/014 on L > no dx per Jamie [name] > rx 40mg  per day > referred for Jamie [name] eval in [name]     10/28/2012 post hosp f/u re ? ILD has not been seen at Jamie [name], still on 20 mg per day Chief Complaint  Patient presents with  . Hospitalization Follow-up    States feels better since leaving the [name] but is not at 100%.  cough is variable, mostly dry, worse when bend over,   R pleuritic cp much better, rash and  arthritis improving but mostly in shoulders and hips, sparing hands and ankles and feet. rec Keep Wake appt to evaluated arthrititis and rash Stay on 40 mg prednisone daily until seen > tapered to 10 mg per days     12/03/2012 f/u ov/Jamie [name] re: cough/ nodular ILD /arthritis now followed at [name] > tapered pred to 10/day Chief Complaint  Patient presents with  . Follow-up    Pt c/o cough progressively worse since last visit. She states cough seems worse with talking.  Cough is non prod.   rec Take delsym two tsp every 12 hours and supplement if needed with  Percocet 1  every 4 hours to suppress the urge to cough.   Once you have eliminated the cough for 3 straight days percocet ,  then the delsym as tolerated.  Pantoprazole (protonix) 40 mg   Take 30-60 min before first meal of the day and Pepcid 20 mg one bedtime until return to office - this is the best way to tell whether stomach acid is contributing to your problem.    Admit date: 12/10/2012  Discharge date: 12/11/2012  Discharge Diagnoses:  Pneumomediastinum  Progressive Nodular [name] Infiltrates  Chronic Cough  HTN  Sinus Tachycardia  Pain   12/18/2012 f/u ov/Jamie [name] re: PF/ ? Rheumatoid  Chief Complaint  Patient presents with  .  HFU    C/o dry cough, some hoarseness. No increase SOB, no wheezing, no chest tx  pred still at 10 mg daily  Cough even percocet no change  tussionex helping at hs  rec tussionex one half tsp in am and full tsp  at bedtime We may need to consider an open biopsy on one of the cavitary nodules on the Right side if your rheumatologist is not able to shed any light on why you have them  Please schedule a follow up office visit in 2 weeks, sooner if needed with cxr on return  Late add : consider Jamie [name] second opinion> Dermatomyositis     03/13/2013 f/u ov/Jamie [name] re: pneumomediastinum Chief Complaint  Patient presents with  . Hospitalization Follow-up    Pt c/o fatigue, some SOB exertion.   Discharged from Jamie [name] on 1/19.     Flare of cough x 3 weeks p uri from multiple fm members > severe, dry, worse at hs   No obvious daytime variabilty or assoc purulent sputum or    chest tightness, subjective wheeze overt sinus or hb symptoms. No unusual exp hx or h/o childhood pna/ asthma or premature birth to her knowledge.   Sleeping ok without nocturnal  or early am exacerbation  of respiratory  c/o's or need for noct saba. Also denies any obvious fluctuation of symptoms with weather or environmental changes or other aggravating or alleviating factors except as outlined above.  Current Medications, Allergies, Complete Past Medical History, Past Surgical History, Family History, and Social History were reviewed in [employer] record.  ROS  The following are not active complaints unless bolded sore throat, dysphagia, dental problems, itching, sneezing,  nasal congestion or excess/ purulent secretions, ear ache,   fever, chills, sweats, unintended wt loss, pleuritic cp or exertional cp, hemoptysis,  orthopnea pnd or leg swelling, presyncope, palpitations, heartburn, abdominal pain, anorexia, nausea, vomiting, diarrhea  or change in bowel or urinary habits, change in stools or urine, dysuria,hematuria,  Rash and  Arthralgias both hands worse on lower doses of prednsione, visual complaints, headache, numbness weakness or ataxia or problems with walking or coordination,  change in mood/affect or memory.              Objective:   Physical Exam  10/28/2012     148   >  12/03/2012  151 > 12/18/2012  147 > 135 03/13/2013  Wt Readings from Last 3 Encounters:  03/08/12 159 lb 12.8 oz (72.485 kg)  03/06/12 162 lb (73.483 kg)  02/13/12 165 lb 4 oz (74.957 kg)    amb bf with dry cough and throat clearing   HEENT: nl dentition, turbinates, and orophanx. Nl external ear canals without cough reflex No crepitance in neck    NECK :  without JVD/Nodes/TM/ nl carotid upstrokes  bilaterally   LUNGS: no acc muscle use, min insp and exp rhonchi bases - neg Haman's   CV:  RRR  no s3 or murmur or increase in P2, no edema - neg Haman's  ABD:  soft and nontender with nl excursion in the supine position. No bruits or organomegaly, bowel sounds nl  MS:  warm without deformities, calf tenderness, cyanosis or clubbing  SKIN: warm and dry with punctate hyperpigmented ulcerations both hands  NEURO:  alert, approp, no deficits      .   cxr 03/09/13 Persistent patchy/nodular severe bilateral [name] infiltrates. No  interim clearing.         Assessment & Plan:

## 2013-03-13 NOTE — Patient Instructions (Signed)
Gluten free trial (no wheat products) for 4-6 weeks. OK to use gluten-free bread and gluten-free pasta.     

## 2013-03-13 NOTE — Assessment & Plan Note (Signed)
Continue with current prescription therapy as reflected on the Med list. Gluten free trial (no wheat products) for 4-6 weeks. OK to use gluten-free bread and gluten-free pasta.

## 2013-03-21 ENCOUNTER — Other Ambulatory Visit: Payer: Self-pay | Admitting: *Deleted

## 2013-03-21 MED ORDER — ONDANSETRON HCL 4 MG PO TABS: 4.0000 mg | ORAL_TABLET | Freq: Three times a day (TID) | ORAL | Status: DC | PRN

## 2013-03-22 DIAGNOSIS — M0520 Rheumatoid vasculitis with rheumatoid arthritis of unspecified site: Secondary | ICD-10-CM | POA: Insufficient documentation

## 2013-03-28 DIAGNOSIS — IMO0002 Reserved for concepts with insufficient information to code with codable children: Secondary | ICD-10-CM | POA: Insufficient documentation

## 2013-04-03 ENCOUNTER — Ambulatory Visit: Payer: 59 | Admitting: Internal Medicine

## 2013-04-26 ENCOUNTER — Other Ambulatory Visit: Payer: Self-pay | Admitting: Internal Medicine

## 2013-06-03 ENCOUNTER — Encounter: Payer: Self-pay | Admitting: Internal Medicine

## 2013-06-03 ENCOUNTER — Ambulatory Visit (INDEPENDENT_AMBULATORY_CARE_PROVIDER_SITE_OTHER): Payer: 59 | Admitting: Internal Medicine

## 2013-06-03 VITALS — BP 142/100 | HR 124 | Temp 96.8°F | Wt 145.3 lb

## 2013-06-03 DIAGNOSIS — L959 Vasculitis limited to the skin, unspecified: Secondary | ICD-10-CM

## 2013-06-03 DIAGNOSIS — M2550 Pain in unspecified joint: Secondary | ICD-10-CM

## 2013-06-03 DIAGNOSIS — B370 Candidal stomatitis: Secondary | ICD-10-CM

## 2013-06-03 DIAGNOSIS — I10 Essential (primary) hypertension: Secondary | ICD-10-CM

## 2013-06-03 DIAGNOSIS — I776 Arteritis, unspecified: Secondary | ICD-10-CM

## 2013-06-03 DIAGNOSIS — L988 Other specified disorders of the skin and subcutaneous tissue: Secondary | ICD-10-CM

## 2013-06-03 DIAGNOSIS — M0520 Rheumatoid vasculitis with rheumatoid arthritis of unspecified site: Secondary | ICD-10-CM

## 2013-06-03 MED ORDER — FLUCONAZOLE 150 MG PO TABS: 150.0000 mg | ORAL_TABLET | Freq: Once | ORAL | Status: DC

## 2013-06-03 MED ORDER — PREDNISONE 20 MG PO TABS: 30.0000 mg | ORAL_TABLET | Freq: Every day | ORAL | Status: DC

## 2013-06-03 NOTE — Assessment & Plan Note (Signed)
Due to steroids - recurrent Diflucan prn

## 2013-06-03 NOTE — Progress Notes (Signed)
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 H/o treatment with prednisone on multiple occasions, chronic since Jan 2014. Noted cavitary pulmonary nodule. Onset rash Jan 2013 after epsiode pneumonia treated with antibiotics. Previous evaluation includes ANCA neg, normal CK and PROTEIN/G CREAT. Negative SCL-70, RNP Ab.  - Start date for high dose steroids 03/31/13 and s/p CYC 2/20 at 1253 mg and 3/20 at 1267 mg  - Recurrent pneumomediastinum: previously followed with pulmonologist in [place], serial CT scans. H/o VATS Sept 2014 after hospital admission, negative cultures and pathology showing "subpleural fibrosis and acute lung injury-no evidence of thromboembolism or vasculitis/granulomas". CT Dec 2014 with scattered nodular lung opacities, repeat FEb 2015 with pneumomediastinum, which may be due to CTD in context of ILD - hands/buttock ulceration debrided by plastic surgery  Jamie [name] is a 48 y.o. female with pmhx noted above who is here for routine follow up.  - last visit was 03/2012 where she was started on plaquenil 400 mg daily and switched to amlodipine for raynaud's, saw pulmonary for recurrent pneumomediastinum. Was on prednisone 60 mg daily, and planned to taper. - today is taking prednisone 40 mg daily, bactrim prophylaxis and plaquenil - doing better with cytoxan infusions, feels better after receiving them and denies any reactions. Denies other infections, no travel. Does feel more active and like herself.  - patient with ulcer on right hand, noticed about 2 months ago, ulceration progressed and followed with plastic surgery who are concerned about tendon rupture. Planning to have surgery soon and cleared from  pulmonologist for this procedure. - patient feeling "pretty good" is sleeping poorly which causes fatigue and is back to work doing ok at work without limitations - denies fevers, chills, sweats, has some dyspnea with stairs. Patient has gained weight 10-15 lbs, good appetite. Denies joint pain, swelling or effusions. - patient states had infection on eye in right side, saw eye doctor 3 weeks ago who prescribed drops which have helped, Eye doctor in [place] (Dr. Idolina [name]) - denies dysphagia, malar rash, has ulcerations on hands. Denies pleurisy, abd pain, n/v/d. No pain after eating - patient has raynaud's, with bluish discoloration at tips with cold. Denies burning sensations with these episodes  Review of Systems Ten-systems review was negative except for the pertinent positives and negatives in the HPI   Medication Sig  . amLODIPine (NORVASC) 10 MG tablet Take 1 tablet (10 mg total) by mouth daily.  . fluconazole (DIFLUCAN) 100 MG tablet  . PLAQUENIL 200 mg tablet Twice daily  . lidocaine (XYLOCAINE JELLY) 2 % jelly Apply topically as needed.  . ondansetron (ZOFRAN) 4 MG tablet  . predniSONE 20 MG tablet Take 2 tablet daily (high dose prednisone started 04/01/2013)  . BACTRIM 400-80 mg per tablet Take 1 tablet by mouth on Monday , Wednesday , Friday  . CTX monthly infusion Has received two infusions so far     03/09/13:  1.  Pulmonary vasculitis associated with rheumatoid arthritis    2.  Pleuritic chest pain    3.  Skin ulcer of buttock    CXR images reviewed and unchanged. Discussed briefly with Dr. Lamonte Sakai, on call for Pulm regarding expected course for pneumomediastinum, he states not uncommon to still be symptomatic. Also discussed with Dr. Pennie Banter, on call for Rheum at [facility], who does not  recommend any change in Prednisone today but will pass message to the patient's rheumatologist to facilitate close followup. Pt has pain medications at home. Advised to return for any worsening  pain, SOB, fever, cough or for any other concerns.  Jamie [name] Starch, MD  03/09/13 1518      Hx: Pulmonary nodules / Rheumatoid vasculitis / Rash and nonspecific skin eruption  Sinus Tachycardia  Arthralgia  Mild hypokalemia   Review of Systems  Constitutional: Positive for fatigue. Negative for fever, chills, activity change, appetite change and unexpected weight change.  HENT: Negative for congestion, mouth sores and sinus pressure.   Eyes: Negative for visual disturbance.  Respiratory: Negative for cough and chest tightness.   Gastrointestinal: Negative for nausea and abdominal pain.  Genitourinary: Negative for frequency, difficulty urinating and vaginal pain.  Musculoskeletal: Positive for arthralgias and back pain. Negative for gait problem.  Skin: Positive for rash. Negative for color change, pallor and wound.  Neurological: Negative for dizziness, tremors, weakness and numbness.  Psychiatric/Behavioral: Negative for suicidal ideas, confusion and sleep disturbance. The patient is not nervous/anxious.        Objective:   Physical Exam  Constitutional: She appears well-developed. No distress.  HENT:  Head: Normocephalic.  Right Ear: External ear normal.  Left Ear: External ear normal.  Nose: Nose normal.  Mouth/Throat: Oropharynx is clear and moist.  Eyes: Conjunctivae are normal. Pupils are equal, round, and reactive to light. Right eye exhibits no discharge. Left eye exhibits no discharge.  Neck: Normal range of motion. Neck supple. No JVD present. No tracheal deviation present. No thyromegaly present.  Cardiovascular: Normal rate, regular rhythm and normal heart sounds.   Pulmonary/Chest: No stridor. No respiratory distress. She has no wheezes.  Abdominal: Soft. Bowel sounds are normal. She exhibits no distension and no mass. There is no tenderness. There is no rebound and no guarding.  Musculoskeletal: She exhibits no edema and no tenderness.  Lymphadenopathy:     She has no cervical adenopathy.  Neurological: She displays normal reflexes. No cranial nerve deficit. She exhibits normal muscle tone. Coordination normal.  Skin: Rash noted. No erythema.  Small necrotic ulcers on palms and fingers  Psychiatric: She has a normal mood and affect. Her behavior is normal. Judgment and thought content normal.  There is an ulcer in the coccyx area between buttocks 20x10x1 mm, hand wounds [name] is present again      Assessment & Plan:

## 2013-06-03 NOTE — Progress Notes (Signed)
Pre visit review using our clinic review tool, if applicable. No additional management support is needed unless otherwise documented below in the visit note. 

## 2013-06-03 NOTE — Assessment & Plan Note (Signed)
Treated at [facility]

## 2013-06-03 NOTE — Assessment & Plan Note (Signed)
Continue with current prescription therapy as reflected on the Med list - med change was done at [facility] recently - now is on Norvasc+HCTZ

## 2013-06-03 NOTE — Assessment & Plan Note (Signed)
Better on Chemo at [facility]

## 2013-06-03 NOTE — Assessment & Plan Note (Signed)
Continue with current prescription therapy as reflected on the Med list.  

## 2013-06-04 ENCOUNTER — Telehealth: Payer: Self-pay | Admitting: Internal Medicine

## 2013-06-04 NOTE — Telephone Encounter (Signed)
Relevant patient education assigned to patient using Emmi. ° °

## 2013-06-09 ENCOUNTER — Telehealth: Payer: Self-pay | Admitting: *Deleted

## 2013-06-09 NOTE — Telephone Encounter (Signed)
Pt called requesting refill of Valtrex.  Please advise

## 2013-06-10 ENCOUNTER — Other Ambulatory Visit: Payer: Self-pay | Admitting: *Deleted

## 2013-06-10 MED ORDER — VALACYCLOVIR HCL 500 MG PO TABS: 500.0000 mg | ORAL_TABLET | Freq: Two times a day (BID) | ORAL | Status: DC

## 2013-06-10 NOTE — Telephone Encounter (Signed)
OK to fill this prescription with additional refills x1 Thank you!  

## 2013-07-18 DIAGNOSIS — Z79899 Other long term (current) drug therapy: Secondary | ICD-10-CM | POA: Insufficient documentation

## 2013-07-18 DIAGNOSIS — M351 Other overlap syndromes: Secondary | ICD-10-CM | POA: Insufficient documentation

## 2013-07-21 ENCOUNTER — Encounter: Payer: Self-pay | Admitting: Internal Medicine

## 2013-07-21 ENCOUNTER — Ambulatory Visit (INDEPENDENT_AMBULATORY_CARE_PROVIDER_SITE_OTHER): Payer: 59 | Admitting: Internal Medicine

## 2013-07-21 VITALS — BP 160/90 | HR 76 | Temp 97.6°F | Resp 16 | Wt 151.0 lb

## 2013-07-21 DIAGNOSIS — R059 Cough, unspecified: Secondary | ICD-10-CM

## 2013-07-21 DIAGNOSIS — R21 Rash and other nonspecific skin eruption: Secondary | ICD-10-CM

## 2013-07-21 DIAGNOSIS — M2550 Pain in unspecified joint: Secondary | ICD-10-CM

## 2013-07-21 DIAGNOSIS — I10 Essential (primary) hypertension: Secondary | ICD-10-CM

## 2013-07-21 DIAGNOSIS — R05 Cough: Secondary | ICD-10-CM

## 2013-07-21 MED ORDER — PROMETHAZINE-CODEINE 6.25-10 MG/5ML PO SYRP: 5.0000 mL | ORAL_SOLUTION | Freq: Three times a day (TID) | ORAL | Status: DC | PRN

## 2013-07-21 MED ORDER — TRIAMTERENE-HCTZ 37.5-25 MG PO TABS: 1.0000 | ORAL_TABLET | Freq: Every day | ORAL | Status: DC

## 2013-07-21 MED ORDER — AMLODIPINE BESYLATE 5 MG PO TABS: 5.0000 mg | ORAL_TABLET | Freq: Every day | ORAL | Status: DC

## 2013-07-21 MED ORDER — LABETALOL HCL 100 MG PO TABS: 100.0000 mg | ORAL_TABLET | Freq: Two times a day (BID) | ORAL | Status: DC

## 2013-07-21 NOTE — Assessment & Plan Note (Signed)
Healing

## 2013-07-21 NOTE — Progress Notes (Signed)
Pre visit review using our clinic review tool, if applicable. No additional management support is needed unless otherwise documented below in the visit note. 

## 2013-07-21 NOTE — Assessment & Plan Note (Signed)
Stop HCTZ Start Maxzide Add Labetalol if needed Cont Norvasc

## 2013-07-21 NOTE — Progress Notes (Signed)
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 H/o treatment with prednisone on multiple occasions, chronic since Jan 2014. Noted cavitary pulmonary nodule. Onset rash Jan 2013 after epsiode pneumonia treated with antibiotics. Previous evaluation includes ANCA neg, normal CK and PROTEIN/G CREAT. Negative SCL-70, RNP Ab.  - Start date for high dose steroids 03/31/13 and s/p CYC 2/20 at 1253 mg and 3/20 at 1267 mg  - Recurrent pneumomediastinum: previously followed with pulmonologist in [place], serial CT scans. H/o VATS Sept 2014 after hospital admission, negative cultures and pathology showing "subpleural fibrosis and acute lung injury-no evidence of thromboembolism or vasculitis/granulomas". CT Dec 2014 with scattered nodular lung opacities, repeat FEb 2015 with pneumomediastinum, which may be due to CTD in context of ILD - hands/buttock ulceration debrided by plastic surgery  Jamie [name] is a 48 y.o. female with pmhx noted above who is here for routine follow up.  - last visit was 03/2012 where she was started on plaquenil 400 mg daily and switched to amlodipine for raynaud's, saw pulmonary for recurrent pneumomediastinum. Was on prednisone 60 mg daily, and planned to taper. - today is taking prednisone 40 mg daily, bactrim prophylaxis and plaquenil - doing better with cytoxan infusions, feels better after receiving them and denies any reactions. Denies other infections, no travel. Does feel more active and like herself.  - patient with ulcer on right hand, noticed about 2 months ago, ulceration progressed and followed with plastic surgery who are concerned about tendon rupture. Planning to have surgery soon and cleared from  pulmonologist for this procedure. - patient feeling "pretty good" is sleeping poorly which causes fatigue and is back to work doing ok at work without limitations - denies fevers, chills, sweats, has some dyspnea with stairs. Patient has gained weight 10-15 lbs, good appetite. Denies joint pain, swelling or effusions. - patient states had infection on eye in right side, saw eye doctor 3 weeks ago who prescribed drops which have helped, Eye doctor in [place] (Dr. Idolina Primer) - denies dysphagia, malar rash, has ulcerations on hands. Denies pleurisy, abd pain, n/v/d. No pain after eating - patient has raynaud's, with bluish discoloration at tips with cold. Denies burning sensations with these episodes  Review of Systems Ten-systems review was negative except for the pertinent positives and negatives in the HPI   Medication Sig  . amLODIPine (NORVASC) 10 MG tablet Take 1 tablet (10 mg total) by mouth daily.  . fluconazole (DIFLUCAN) 100 MG tablet  . PLAQUENIL 200 mg tablet Twice daily  . lidocaine (XYLOCAINE JELLY) 2 % jelly Apply topically as needed.  . ondansetron (ZOFRAN) 4 MG tablet  . predniSONE 20 MG tablet Take 2 tablet daily (high dose prednisone started 04/01/2013)  . BACTRIM 400-80 mg per tablet Take 1 tablet by mouth on Monday , Wednesday , Friday  . CTX monthly infusion Has received two infusions so far     03/09/13:  1.  Pulmonary vasculitis associated with rheumatoid arthritis    2.  Pleuritic chest pain    3.  Skin ulcer of buttock    CXR images reviewed and unchanged. Discussed briefly with Dr. Lamonte Sakai, on call for Pulm regarding expected course for pneumomediastinum, he states not uncommon to still be symptomatic. Also discussed with Dr. Pennie Banter, on call for Rheum at [facility], who does not  recommend any change in Prednisone today but will pass message to the patient's rheumatologist to facilitate close followup. Pt has pain medications at home. Advised to return for any worsening  pain, SOB, fever, cough or for any other concerns.  [name], MD  03/09/13 1518      Hx: Pulmonary nodules / Rheumatoid vasculitis / Rash and nonspecific skin eruption  Sinus Tachycardia  Arthralgia  Mild hypokalemia   Review of Systems  Constitutional: Positive for fatigue. Negative for fever, chills, activity change, appetite change and unexpected weight change.  HENT: Negative for congestion, mouth sores and sinus pressure.   Eyes: Negative for visual disturbance.  Respiratory: Negative for cough and chest tightness.   Gastrointestinal: Negative for nausea and abdominal pain.  Genitourinary: Negative for frequency, difficulty urinating and vaginal pain.  Musculoskeletal: Positive for arthralgias and back pain. Negative for gait problem.  Skin: Positive for rash. Negative for color change, pallor and wound.  Neurological: Negative for dizziness, tremors, weakness and numbness.  Psychiatric/Behavioral: Negative for suicidal ideas, confusion and sleep disturbance. The patient is not nervous/anxious.        Objective:   Physical Exam  Constitutional: She appears well-developed. No distress.  HENT:  Head: Normocephalic.  Right Ear: External ear normal.  Left Ear: External ear normal.  Nose: Nose normal.  Mouth/Throat: Oropharynx is clear and moist.  Eyes: Conjunctivae are normal. Pupils are equal, round, and reactive to light. Right eye exhibits no discharge. Left eye exhibits no discharge.  Neck: Normal range of motion. Neck supple. No JVD present. No tracheal deviation present. No thyromegaly present.  Cardiovascular: Normal rate, regular rhythm and normal heart sounds.   Pulmonary/Chest: No stridor. No respiratory distress. She has no wheezes.  Abdominal: Soft. Bowel sounds are normal. She exhibits no distension and no mass. There is no tenderness. There is no rebound and no guarding.  Musculoskeletal: She exhibits no edema and no tenderness.  Lymphadenopathy:     She has no cervical adenopathy.  Neurological: She displays normal reflexes. No cranial nerve deficit. She exhibits normal muscle tone. Coordination normal.  Skin: Rash noted. No erythema.  Small necrotic ulcers on palms and fingers  Psychiatric: She has a normal mood and affect. Her behavior is normal. Judgment and thought content normal.  There is an ulcer in the coccyx area between buttocks closing up, hand wounds healed [name] is not present      Assessment & Plan:

## 2013-07-21 NOTE — Assessment & Plan Note (Signed)
Prom w/cod prn

## 2013-07-21 NOTE — Assessment & Plan Note (Signed)
Better  

## 2013-11-20 LAB — HM COLONOSCOPY

## 2013-11-28 ENCOUNTER — Other Ambulatory Visit: Payer: Self-pay

## 2014-02-17 ENCOUNTER — Other Ambulatory Visit: Payer: Self-pay

## 2014-02-17 DIAGNOSIS — Z1231 Encounter for screening mammogram for malignant neoplasm of breast: Secondary | ICD-10-CM

## 2014-02-26 ENCOUNTER — Ambulatory Visit
Admission: RE | Admit: 2014-02-26 | Discharge: 2014-02-26 | Disposition: A | Discharge status: Home / Self Care | Payer: 59 | Source: Ambulatory Visit

## 2014-02-26 DIAGNOSIS — Z1231 Encounter for screening mammogram for malignant neoplasm of breast: Secondary | ICD-10-CM

## 2014-07-02 ENCOUNTER — Other Ambulatory Visit: Payer: Self-pay | Admitting: Obstetrics and Gynecology

## 2014-07-03 LAB — CYTOLOGY - PAP

## 2014-09-03 ENCOUNTER — Telehealth: Payer: Self-pay | Admitting: Internal Medicine

## 2014-09-03 MED ORDER — TRIAMTERENE-HCTZ 37.5-25 MG PO TABS: 1.0000 | ORAL_TABLET | Freq: Every day | ORAL | Status: DC

## 2014-09-03 NOTE — Telephone Encounter (Signed)
Patient need a refill of her Amlodipine 5 mg and triamterene-hydrochlorothiazide (MAXZIDE-25) 37.5-25 MG per tablet send to [place], [place]

## 2014-09-13 IMAGING — CT CT ANGIO CHEST
2 of 9 series · 18 of 46 positions shown · IV contrast (APPLIED)
Comparison: 10/11/2012

ADDENDUM:
The original report was by Dr. Jean Wilgaud Delfonse. The following
addendum is by Dr. Jean Wilgaud Delfonse:

The 1st paragraph under FINDINGS should read:
No filling defect is identified in the pulmonary arterial tree to
suggest pulmonary embolus. No aortic dissection observed.
CLINICAL DATA: Right chest pain.
EXAM:
CT ANGIOGRAPHY CHEST WITH CONTRAST
TECHNIQUE: Multidetector CT imaging of the chest was performed using the
standard protocol during bolus administration of intravenous
contrast. Multiplanar CT image reconstructions including MIPs were
obtained to evaluate the vascular anatomy.
CONTRAST:  100mL OMNIPAQUE IOHEXOL 350 MG/ML SOLN

[Series 5: thins · axial · 0.56mm/px · z∈[+1158,+1378]mm · 15 of 250 slices shown]
[im 15/250  lung]
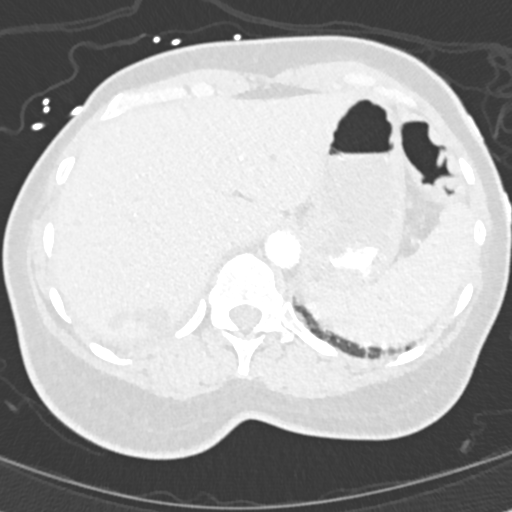
[im 30/250  soft-tissue]
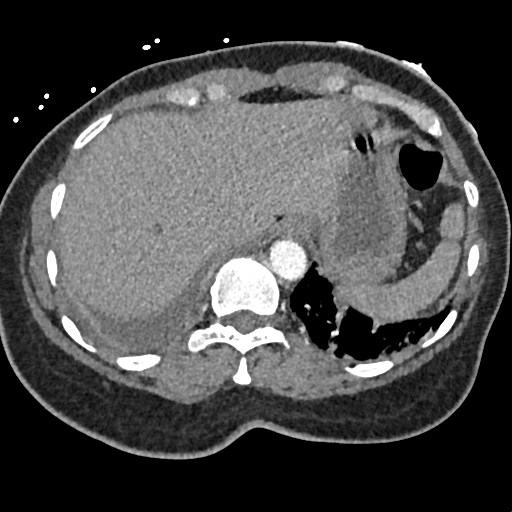
[im 44/250  lung]
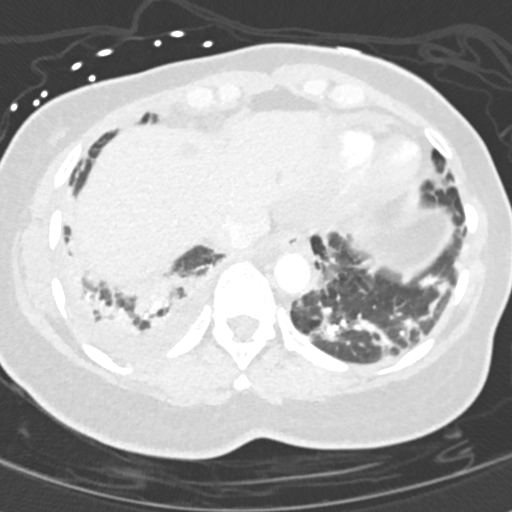
[im 59/250  soft-tissue]
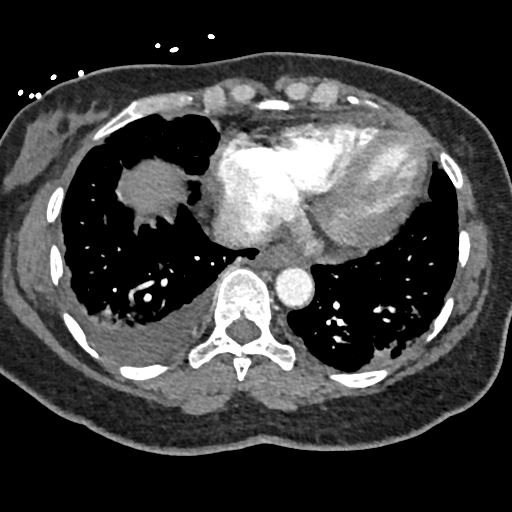
[im 74/250  lung]
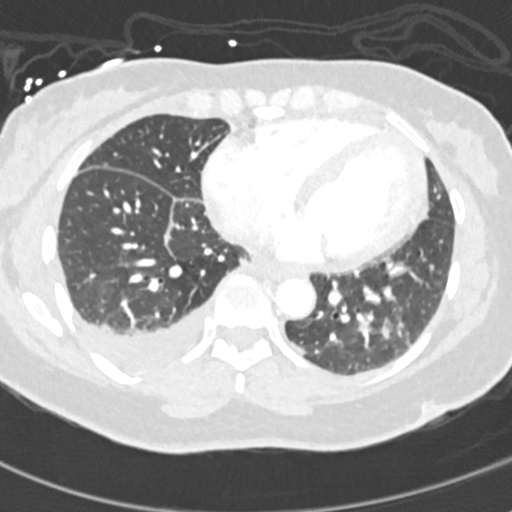
[im 88/250  soft-tissue]
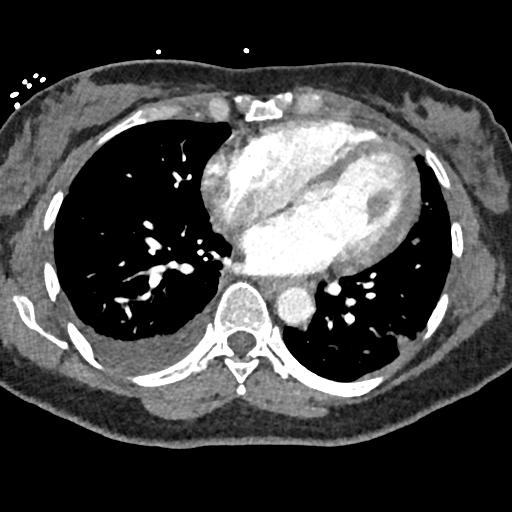
[im 103/250  lung]
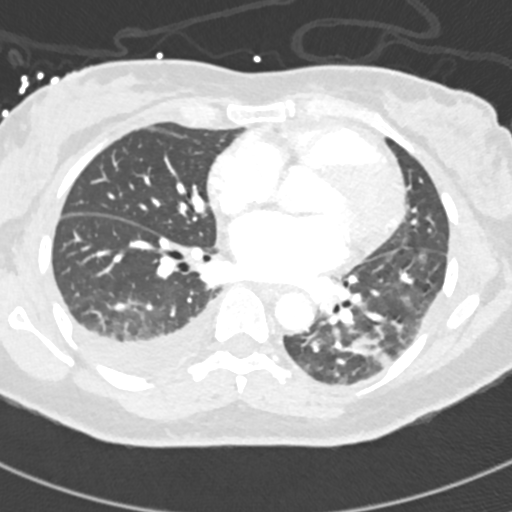
[im 132/250  soft-tissue]
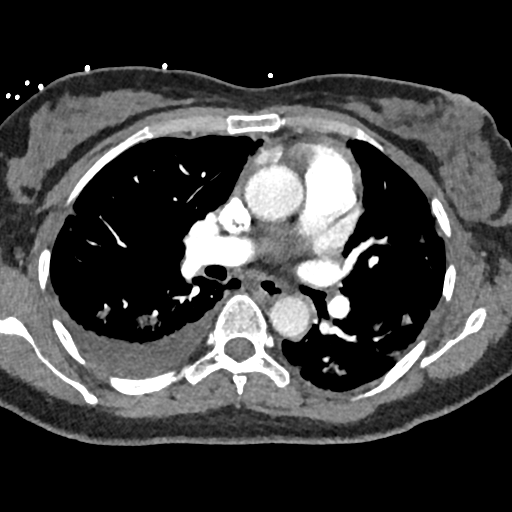
[im 147/250  lung]
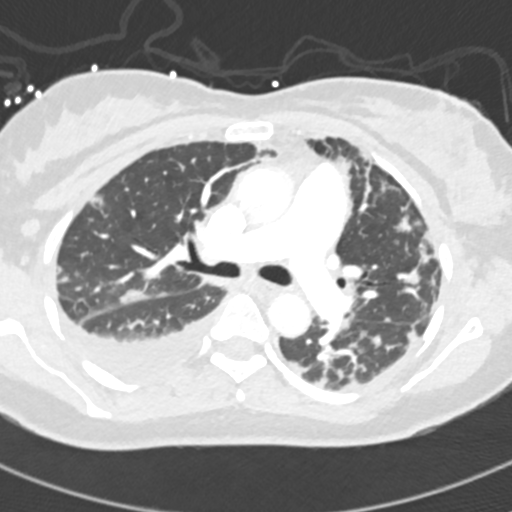
[im 162/250  soft-tissue]
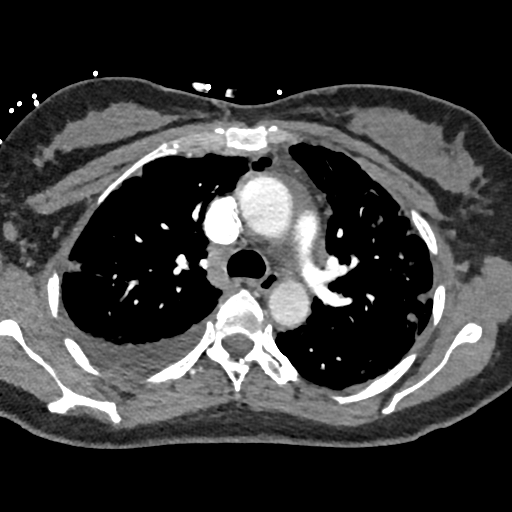
[im 176/250  lung]
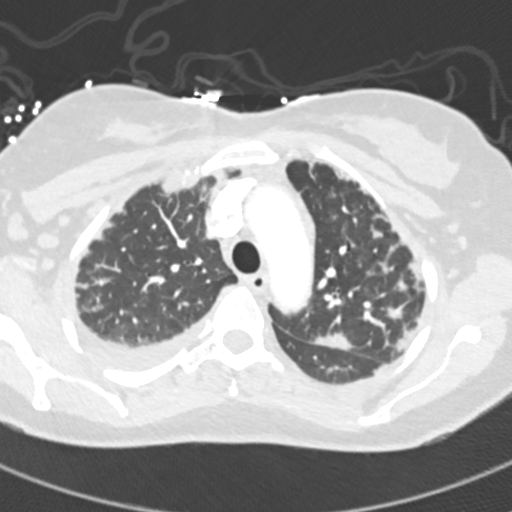
[im 191/250  soft-tissue]
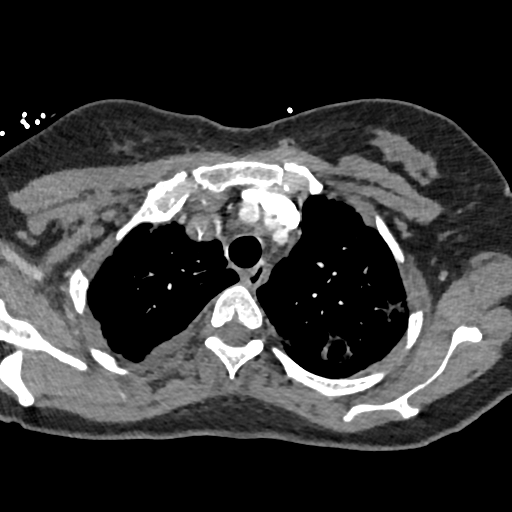
[im 206/250  lung]
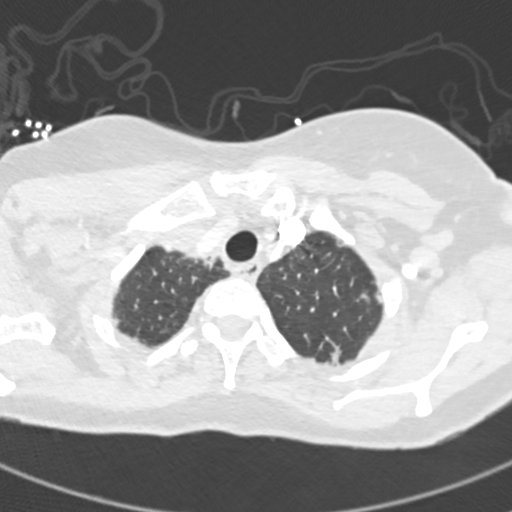
[im 220/250  soft-tissue]
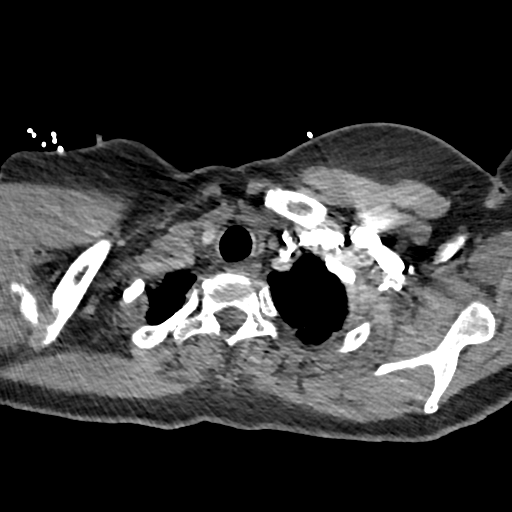
[im 235/250  lung]
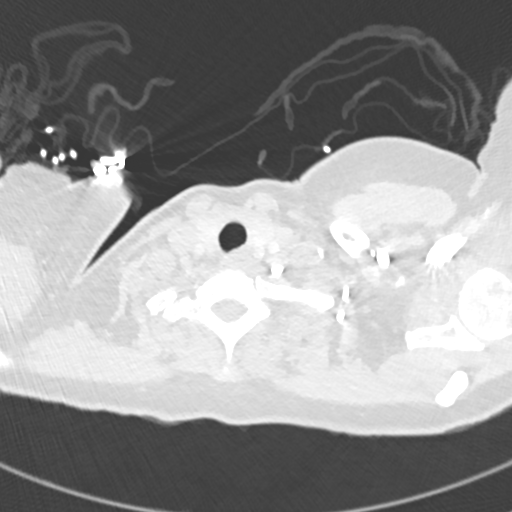

[Series 7: coronal mpr · coronal · 0.48mm/px · 3 of 91 slices shown]
[im 23/91  soft-tissue]
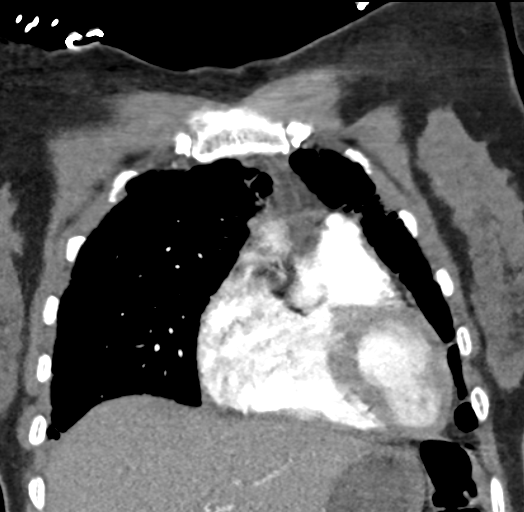
[im 46/91  soft-tissue]
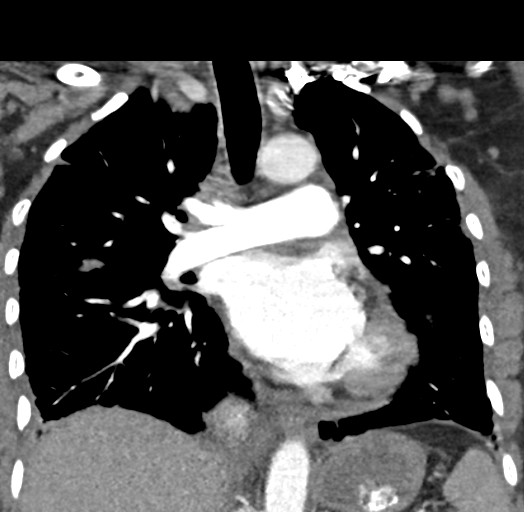
[im 68/91  soft-tissue]
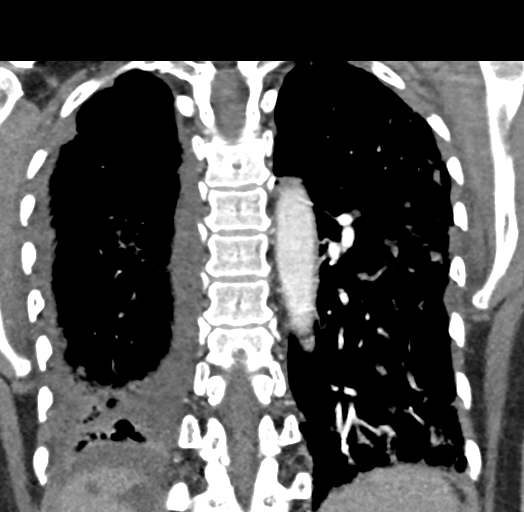

[18 of 46 positions shown; findings below may reference images not displayed]

FINDINGS: Insert skip embolus no aortic dissection observed.

Moderate right pleural effusion noted with scattered peripheral and
subpleural densities and scattered irregular nodularity in both
lungs. A 1.3 x 1.7 cm nodule in the left lower lobe on image 45 of
series 6 appears to be cavitary with a thick wall.

Bilateral axillary lymph nodes noted, short axis diameter up to
cm. Small pericardial effusion. Scattered hypodense lesions in the
liver, some of which appear irregular but sharply defined, and
potentially with internal septations.

No definite thickening of the airways although some of the
nodularity is superior bronchovascular. A sclerotic 1 cm lesion with
central lucency is present in the right 2nd rib laterally on image
21 of series 6, and is technically nonspecific.

Review of the MIP images confirms the above findings.
IMPRESSION: 1. Peripheral irregularity in nodules primarily along the subpleural
regions, with some associated interstitial accentuation and with
some pericolonic bronchovascular nodules. These nodules are
irregular, and one of the nodules appears to be cavitary.
Differential diagnostic considerations include sarcoidosis, septic
emboli, cryptogenic organizing pneumonia, Churg-Strauss syndrome,
chronic eosinophilic pneumonia, tuberculosis, and Wegener's
granulomatosis.
2. Moderate right pleural effusion. Small pericardial effusion.
3. Scattered the somewhat irregular hypodense lesions in the liver
may be septated and early partially characterized on today's exam.
4. No pulmonary embolus observed.
5. Small sclerotic lesion with central lucency in the right 2nd rib
laterally, likely incidental.

## 2014-09-14 ENCOUNTER — Other Ambulatory Visit: Payer: Self-pay | Admitting: *Deleted

## 2014-09-14 MED ORDER — VALACYCLOVIR HCL 500 MG PO TABS: 500.0000 mg | ORAL_TABLET | Freq: Two times a day (BID) | ORAL | Status: DC

## 2014-10-02 ENCOUNTER — Encounter: Payer: Self-pay | Admitting: Internal Medicine

## 2014-10-02 ENCOUNTER — Ambulatory Visit (INDEPENDENT_AMBULATORY_CARE_PROVIDER_SITE_OTHER): Payer: Commercial Managed Care - HMO | Admitting: Internal Medicine

## 2014-10-02 VITALS — BP 110/82 | HR 124 | Temp 98.4°F | Wt 154.0 lb

## 2014-10-02 DIAGNOSIS — R05 Cough: Secondary | ICD-10-CM | POA: Diagnosis not present

## 2014-10-02 DIAGNOSIS — J3089 Other allergic rhinitis: Secondary | ICD-10-CM

## 2014-10-02 DIAGNOSIS — R059 Cough, unspecified: Secondary | ICD-10-CM

## 2014-10-02 DIAGNOSIS — J309 Allergic rhinitis, unspecified: Secondary | ICD-10-CM | POA: Insufficient documentation

## 2014-10-02 MED ORDER — BREATHERITE COLL SPACER ADULT MISC: Status: DC

## 2014-10-02 MED ORDER — MOMETASONE FURO-FORMOTEROL FUM 200-5 MCG/ACT IN AERO: 2.0000 | INHALATION_SPRAY | Freq: Two times a day (BID) | RESPIRATORY_TRACT | Status: DC

## 2014-10-02 MED ORDER — BECLOMETHASONE DIPROPIONATE 80 MCG/ACT NA AERS: 1.0000 | INHALATION_SPRAY | Freq: Every day | NASAL | Status: DC

## 2014-10-02 MED ORDER — PROMETHAZINE-CODEINE 6.25-10 MG/5ML PO SYRP: 5.0000 mL | ORAL_SOLUTION | Freq: Three times a day (TID) | ORAL | Status: DC | PRN

## 2014-10-02 NOTE — Progress Notes (Signed)
Subjective:  Patient ID: Jamie [name], female    DOB: 11/11/1965  Age: 49 y.o. MRN: [id number]  CC: Cough   HPI Jamie [name] presents for a dry cough, post-nasal drip x 6 weeks. Pt had a CXR in 5/16 at [facility] - ok. 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 H/o treatment with prednisone on multiple occasions, chronic since Jan 2014. Noted cavitary pulmonary nodule. Onset rash Jan 2013 after epsiode pneumonia treated with antibiotics. Previous evaluation includes ANCA neg, normal CK and PROTEIN/G CREAT. Negative SCL-70, RNP Ab.  - Start date for high dose steroids 03/31/13 and s/p CYC 2/20 at 1253 mg and 3/20 at 1267 mg  - Recurrent pneumomediastinum: previously followed with pulmonologist in [place], serial CT scans. H/o VATS Sept 2014 after hospital admission, negative cultures and pathology showing "subpleural fibrosis and acute lung injury-no evidence of thromboembolism or vasculitis/granulomas". CT Dec 2014 with scattered nodular lung opacities, repeat FEb 2015 with pneumomediastinum, which may be due to CTD in context of ILD - hands/buttock ulceration debrided by plastic surgery  Outpatient Prescriptions Prior to Visit  Medication Sig Dispense Refill  . amLODipine (NORVASC) 5 MG tablet Take 1 tablet (5 mg total) by mouth daily. 30 tablet 11  . Nutritional Supplements (JUICE PLUS FIBRE PO) Take 1 capsule by mouth daily.    . ondansetron (ZOFRAN) 4 MG tablet Take 1 tablet (4 mg total) by mouth every 8 (eight) hours as needed for nausea or vomiting. 20 tablet 1  . pantoprazole (PROTONIX) 40 MG tablet Take 1 tablet (40 mg total) by mouth daily. Take 30-60 min before first meal of the day 30 tablet 2  .  triamterene-hydrochlorothiazide (MAXZIDE-25) 37.5-25 MG per tablet Take 1 tablet by mouth daily. Patient needs an appointment for additional refills. 30 tablet 1  . valACYclovir (VALTREX) 500 MG tablet Take 1 tablet (500 mg total) by mouth 2 (two) times daily. 14 tablet 1  . predniSONE (DELTASONE) 20 MG tablet Take 1.5 tablets (30 mg total) by mouth daily with breakfast. 100 tablet 3  . famotidine (PEPCID) 20 MG tablet One at bedtime (Patient not taking: Reported on 10/02/2014) 30 tablet 2  . fluconazole (DIFLUCAN) 150 MG tablet Take 1 tablet (150 mg total) by mouth once. (Patient not taking: Reported on 10/02/2014) 3 tablet 1  . labetalol (NORMODYNE) 100 MG tablet Take 1 tablet (100 mg total) by mouth 2 (two) times daily. (Patient not taking: Reported on 10/02/2014) 60 tablet 11  . mupirocin ointment (BACTROBAN) 2 % Use bid (Patient not taking: Reported on 10/02/2014) 30 g 0  . promethazine-codeine (PHENERGAN WITH CODEINE) 6.25-10 MG/5ML syrup Take 5 mLs by mouth every 8 (eight) hours as needed for cough. (Patient not taking: Reported on 10/02/2014) 300 mL 0   No facility-administered medications prior to visit.    ROS Review of Systems  Constitutional: Negative for chills, activity change, appetite change, fatigue and unexpected weight change.  HENT: Negative for congestion, mouth sores and sinus pressure.   Eyes: Negative for visual disturbance.  Respiratory: Negative for cough and chest tightness.   Gastrointestinal: Negative for nausea and abdominal pain.  Genitourinary: Negative for frequency, difficulty urinating and vaginal pain.  Musculoskeletal: Positive for back pain and arthralgias. Negative for gait problem.  Skin: Negative for pallor and rash.  Neurological: Negative for dizziness, tremors, weakness, numbness and headaches.  Psychiatric/Behavioral: Negative for suicidal ideas, confusion and sleep disturbance.    Objective:  BP 110/82 mmHg  Pulse 124  Temp(Src) 98.4 F (36.9 C)  (Oral)  Wt 154 lb (69.854 kg)  SpO2 98%  BP Readings from Last 3 Encounters:  10/02/14 110/82  07/21/13 160/90  06/03/13 142/100    Wt Readings from Last 3 Encounters:  10/02/14 154 lb (69.854 kg)  07/21/13 151 lb (68.493 kg)  06/03/13 145 lb 5 oz (65.913 kg)    Physical Exam  Constitutional: She appears well-developed. No distress.  HENT:  Head: Normocephalic.  Right Ear: External ear normal.  Left Ear: External ear normal.  Nose: Nose normal.  Mouth/Throat: Oropharynx is clear and moist.  Eyes: Conjunctivae are normal. Pupils are equal, round, and reactive to light. Right eye exhibits no discharge. Left eye exhibits no discharge.  Neck: Normal range of motion. Neck supple. No JVD present. No tracheal deviation present. No thyromegaly present.  Cardiovascular: Normal rate, regular rhythm and normal heart sounds.   Pulmonary/Chest: No stridor. No respiratory distress. She has no wheezes.  Abdominal: Soft. Bowel sounds are normal. She exhibits no distension and no mass. There is no tenderness. There is no rebound and no guarding.  Musculoskeletal: She exhibits no edema or tenderness.  Lymphadenopathy:    She has no cervical adenopathy.  Neurological: She displays normal reflexes. No cranial nerve deficit. She exhibits normal muscle tone. Coordination normal.  Skin: No rash noted. No erythema.  Psychiatric: She has a normal mood and affect. Her behavior is normal. Judgment and thought content normal.    Lab Results  Component Value Date   WBC 10.6* 03/05/2013   HGB 13.0 03/05/2013   HCT 38.9 03/05/2013   PLT 175 03/05/2013   GLUCOSE 75 03/05/2013   CHOL 152 10/27/2010   TRIG 35.0 10/27/2010   HDL 50.80 10/27/2010   LDLCALC 94 10/27/2010   ALT 54* 03/03/2013   AST 56* 03/03/2013   NA 139 03/05/2013   K 3.5* 03/05/2013   CL 99 03/05/2013   CREATININE 0.68 03/05/2013   BUN 12 03/05/2013   CO2 27 03/05/2013   TSH 0.576 10/12/2012   INR 0.88 10/17/2012   HGBA1C 5.9*  10/14/2012    Mm Screening Breast Tomo Bilateral  02/27/2014   CLINICAL DATA:  Screening.  EXAM: DIGITAL SCREENING BILATERAL MAMMOGRAM WITH 3D TOMO WITH CAD  COMPARISON:  Previous exam(s).  ACR Breast Density Category d: The breast tissue is extremely dense, which lowers the sensitivity of mammography.  FINDINGS: There are no findings suspicious for malignancy. Images were processed with CAD.  IMPRESSION: No mammographic evidence of malignancy. A result letter of this screening mammogram will be mailed directly to the patient.  RECOMMENDATION: Screening mammogram in one year. (Code:SM-B-01Y)  BI-RADS CATEGORY  1: Negative.   Electronically Signed   By: Abelardo Diesel M.D.   On: 02/27/2014 16:27   I personally provided [facility] inhaler use teaching. After the teaching patient was able to demonstrate it's use effectively. All questions were answered  Assessment & Plan:   There are no diagnoses linked to this encounter. I am having Ms. Bautch maintain her Nutritional Supplements (JUICE PLUS FIBRE PO), mupirocin ointment, famotidine, pantoprazole, ondansetron, fluconazole, amLODipine, labetalol, promethazine-codeine, triamterene-hydrochlorothiazide, valACYclovir, hydroxychloroquine, mycophenolate, estradiol, and predniSONE.  Meds ordered this encounter  Medications  . hydroxychloroquine (PLAQUENIL) 200 MG tablet    Sig: Take 200 mg by mouth 2 (two) times daily.  [name]  mycophenolate (CELLCEPT) 500 MG tablet    Sig: Take 2 tablets by mouth 2 (two) times daily.  [name] estradiol (VIVELLE-DOT) 0.05 MG/24HR patch    Sig: 2 (two) times a week.    Refill:  1  . predniSONE (DELTASONE) 5 MG tablet    Sig: Take 1 tablet by mouth daily.    Refill:  0     Follow-up: No Follow-up on file.  Walker Kehr, MD

## 2014-10-02 NOTE — Assessment & Plan Note (Addendum)
a dry cough, post-nasal drip x 6 weeks. Pt had a CXR in 5/16 at [facility] - ok. Pt is on Predn 5 mg/d Dulera Spacer Prom-cod

## 2014-10-02 NOTE — Progress Notes (Signed)
Pre visit review using our clinic review tool, if applicable. No additional management support is needed unless otherwise documented below in the visit note. 

## 2014-10-02 NOTE — Assessment & Plan Note (Signed)
Qnasl

## 2015-02-03 ENCOUNTER — Other Ambulatory Visit: Payer: Self-pay

## 2015-02-03 DIAGNOSIS — Z1231 Encounter for screening mammogram for malignant neoplasm of breast: Secondary | ICD-10-CM

## 2015-03-04 ENCOUNTER — Ambulatory Visit
Admission: RE | Admit: 2015-03-04 | Discharge: 2015-03-04 | Disposition: A | Discharge status: Home / Self Care | Payer: Commercial Managed Care - HMO | Source: Ambulatory Visit

## 2015-03-04 DIAGNOSIS — Z1231 Encounter for screening mammogram for malignant neoplasm of breast: Secondary | ICD-10-CM

## 2015-04-08 ENCOUNTER — Encounter: Payer: Self-pay | Admitting: Internal Medicine

## 2015-04-08 ENCOUNTER — Ambulatory Visit (INDEPENDENT_AMBULATORY_CARE_PROVIDER_SITE_OTHER): Payer: Commercial Managed Care - HMO | Admitting: Internal Medicine

## 2015-04-08 VITALS — BP 144/90 | HR 110 | Temp 98.0°F | Wt 164.0 lb

## 2015-04-08 DIAGNOSIS — R05 Cough: Secondary | ICD-10-CM | POA: Diagnosis not present

## 2015-04-08 DIAGNOSIS — R918 Other nonspecific abnormal finding of lung field: Secondary | ICD-10-CM

## 2015-04-08 DIAGNOSIS — I10 Essential (primary) hypertension: Secondary | ICD-10-CM

## 2015-04-08 DIAGNOSIS — R059 Cough, unspecified: Secondary | ICD-10-CM

## 2015-04-08 MED ORDER — PROMETHAZINE-CODEINE 6.25-10 MG/5ML PO SYRP: 5.0000 mL | ORAL_SOLUTION | Freq: Three times a day (TID) | ORAL | Status: DC | PRN

## 2015-04-08 NOTE — Progress Notes (Signed)
Subjective:  Patient ID: Jamie [name], female    DOB: December 06, 1965  Age: 50 y.o. MRN: [id number]  CC: No chief complaint on file.   HPI Jamie STUEBE presents for dry barking cough - worse at night x 1 week. Not using [facility]  Outpatient Prescriptions Prior to Visit  Medication Sig Dispense Refill  . amLODipine (NORVASC) 5 MG tablet Take 1 tablet (5 mg total) by mouth daily. 30 tablet 11  . Beclomethasone Dipropionate (QNASL) 80 MCG/ACT AERS Place 1 spray into the nose daily. 1 Inhaler 5  . estradiol (VIVELLE-DOT) 0.05 MG/24HR patch 2 (two) times a week.  1  . famotidine (PEPCID) 20 MG tablet One at bedtime 30 tablet 2  . hydroxychloroquine (PLAQUENIL) 200 MG tablet Take 200 mg by mouth 2 (two) times daily.    [name] labetalol (NORMODYNE) 100 MG tablet Take 1 tablet (100 mg total) by mouth 2 (two) times daily. 60 tablet 11  . mometasone-formoterol (DULERA) 200-5 MCG/ACT AERO Inhale 2 puffs into the lungs 2 (two) times daily. 1 Inhaler 5  . mupirocin ointment (BACTROBAN) 2 % Use bid 30 g 0  . mycophenolate (CELLCEPT) 500 MG tablet Take 2 tablets by mouth 2 (two) times daily.    . Nutritional Supplements (JUICE PLUS FIBRE PO) Take 1 capsule by mouth daily.    . ondansetron (ZOFRAN) 4 MG tablet Take 1 tablet (4 mg total) by mouth every 8 (eight) hours as needed for nausea or vomiting. 20 tablet 1  . pantoprazole (PROTONIX) 40 MG tablet Take 1 tablet (40 mg total) by mouth daily. Take 30-60 min before first meal of the day 30 tablet 2  . predniSONE (DELTASONE) 5 MG tablet Take 1 tablet by mouth daily.  0  . promethazine-codeine (PHENERGAN WITH CODEINE) 6.25-10 MG/5ML syrup Take 5 mLs by mouth every 8 (eight) hours as needed for cough. 300 mL 0  . Spacer/Aero-Holding Chambers (BREATHERITE COLL SPACER ADULT) MISC As directed w/MDI 1 each 0  . triamterene-hydrochlorothiazide (MAXZIDE-25) 37.5-25 MG per tablet Take 1 tablet by mouth daily. Patient needs an appointment for additional refills. 30  tablet 1  . valACYclovir (VALTREX) 500 MG tablet Take 1 tablet (500 mg total) by mouth 2 (two) times daily. 14 tablet 1   No facility-administered medications prior to visit.    ROS Review of Systems  Constitutional: Negative for chills, activity change, appetite change, fatigue and unexpected weight change.  HENT: Negative for congestion, mouth sores and sinus pressure.   Eyes: Negative for visual disturbance.  Respiratory: Positive for cough. Negative for chest tightness, shortness of breath and wheezing.   Gastrointestinal: Negative for nausea and abdominal pain.  Genitourinary: Negative for frequency, difficulty urinating and vaginal pain.  Musculoskeletal: Negative for back pain and gait problem.  Skin: Negative for pallor and rash.  Neurological: Negative for dizziness, tremors, weakness, numbness and headaches.  Psychiatric/Behavioral: Negative for confusion and sleep disturbance.    Objective:  BP 144/90 mmHg  Pulse 110  Temp(Src) 98 F (36.7 C) (Oral)  Wt 164 lb (74.39 kg)  SpO2 97%  BP Readings from Last 3 Encounters:  04/08/15 144/90  10/02/14 110/82  07/21/13 160/90    Wt Readings from Last 3 Encounters:  04/08/15 164 lb (74.39 kg)  10/02/14 154 lb (69.854 kg)  07/21/13 151 lb (68.493 kg)    Physical Exam  Constitutional: She appears well-developed. No distress.  HENT:  Head: Normocephalic.  Right Ear: External ear normal.  Left Ear: External ear normal.  Nose: Nose normal.  Mouth/Throat: Oropharynx is clear and moist.  Eyes: Conjunctivae are normal. Pupils are equal, round, and reactive to light. Right eye exhibits no discharge. Left eye exhibits no discharge.  Neck: Normal range of motion. Neck supple. No JVD present. No tracheal deviation present. No thyromegaly present.  Cardiovascular: Normal rate, regular rhythm and normal heart sounds.   Pulmonary/Chest: No stridor. No respiratory distress. She has no wheezes.  Abdominal: Soft. Bowel sounds are  normal. She exhibits no distension and no mass. There is no tenderness. There is no rebound and no guarding.  Musculoskeletal: She exhibits no edema or tenderness.  Lymphadenopathy:    She has no cervical adenopathy.  Neurological: She displays normal reflexes. No cranial nerve deficit. She exhibits normal muscle tone. Coordination normal.  Skin: No rash noted. No erythema.  Psychiatric: She has a normal mood and affect. Her behavior is normal. Judgment and thought content normal.  coughing   Lab Results  Component Value Date   WBC 10.6* 03/05/2013   HGB 13.0 03/05/2013   HCT 38.9 03/05/2013   PLT 175 03/05/2013   GLUCOSE 75 03/05/2013   CHOL 152 10/27/2010   TRIG 35.0 10/27/2010   HDL 50.80 10/27/2010   LDLCALC 94 10/27/2010   ALT 54* 03/03/2013   AST 56* 03/03/2013   NA 139 03/05/2013   K 3.5* 03/05/2013   CL 99 03/05/2013   CREATININE 0.68 03/05/2013   BUN 12 03/05/2013   CO2 27 03/05/2013   TSH 0.576 10/12/2012   INR 0.88 10/17/2012   HGBA1C 5.9* 10/14/2012    Mm Screening Breast Tomo Bilateral  03/04/2015  CLINICAL DATA:  Screening. EXAM: DIGITAL SCREENING BILATERAL MAMMOGRAM WITH 3D TOMO WITH CAD COMPARISON:  Previous exam(s). ACR Breast Density Category c: The breast tissue is heterogeneously dense, which may obscure small masses. FINDINGS: There are no findings suspicious for malignancy. Images were processed with CAD. IMPRESSION: No mammographic evidence of malignancy. A result letter of this screening mammogram will be mailed directly to the patient. RECOMMENDATION: Screening mammogram in one year. (Code:SM-B-01Y) BI-RADS CATEGORY  1: Negative. Electronically Signed   By: Marin Olp M.D.   On: 03/04/2015 17:07    Assessment & Plan:   There are no diagnoses linked to this encounter. I am having Jamie [name] maintain her Nutritional Supplements (JUICE PLUS FIBRE PO), mupirocin ointment, famotidine, pantoprazole, ondansetron, amLODipine, labetalol,  triamterene-hydrochlorothiazide, valACYclovir, hydroxychloroquine, mycophenolate, estradiol, predniSONE, mometasone-formoterol, BREATHERITE COLL SPACER ADULT, promethazine-codeine, Beclomethasone Dipropionate, amLODipine, and predniSONE.  Meds ordered this encounter  Medications  . amLODipine (NORVASC) 10 MG tablet    Sig: Take 1 tablet by mouth daily.  . predniSONE (DELTASONE) 1 MG tablet    Sig: Take 4 mg by mouth daily.    Refill:  0     Follow-up: No Follow-up on file.  Walker Kehr, MD

## 2015-04-08 NOTE — Progress Notes (Signed)
Pre visit review using our clinic review tool, if applicable. No additional management support is needed unless otherwise documented below in the visit note. 

## 2015-04-08 NOTE — Assessment & Plan Note (Signed)
Acute URI CXR Re-start Dulera Prom-cod syr

## 2015-04-08 NOTE — Assessment & Plan Note (Signed)
Amlodipine.

## 2015-04-08 NOTE — Assessment & Plan Note (Signed)
CXR

## 2015-06-02 ENCOUNTER — Telehealth: Payer: Self-pay | Admitting: *Deleted

## 2015-06-02 DIAGNOSIS — R059 Cough, unspecified: Secondary | ICD-10-CM

## 2015-06-02 DIAGNOSIS — R05 Cough: Secondary | ICD-10-CM

## 2015-06-02 NOTE — Telephone Encounter (Signed)
Pt left msg on triage stating she has made an appt for Monday concerning ongoing cough due to her history she is asking if she can come in tomorrow to have an chest xray done prior to visit...Jamie [name]

## 2015-06-03 ENCOUNTER — Other Ambulatory Visit: Payer: Self-pay | Admitting: Internal Medicine

## 2015-06-03 ENCOUNTER — Ambulatory Visit (INDEPENDENT_AMBULATORY_CARE_PROVIDER_SITE_OTHER)
Admission: RE | Admit: 2015-06-03 | Discharge: 2015-06-03 | Disposition: A | Discharge status: Home / Self Care | Payer: Commercial Managed Care - HMO | Source: Ambulatory Visit | Attending: Internal Medicine | Admitting: Internal Medicine

## 2015-06-03 DIAGNOSIS — R05 Cough: Secondary | ICD-10-CM

## 2015-06-03 DIAGNOSIS — J90 Pleural effusion, not elsewhere classified: Secondary | ICD-10-CM

## 2015-06-03 DIAGNOSIS — R059 Cough, unspecified: Secondary | ICD-10-CM

## 2015-06-03 MED ORDER — PROMETHAZINE-CODEINE 6.25-10 MG/5ML PO SYRP: 5.0000 mL | ORAL_SOLUTION | Freq: Four times a day (QID) | ORAL | Status: DC | PRN

## 2015-06-03 NOTE — Telephone Encounter (Signed)
OK CXR °Thx °

## 2015-06-03 NOTE — Telephone Encounter (Signed)
Notified pt MD ok cxr has been ordered...Johny Chess

## 2015-06-04 ENCOUNTER — Encounter: Payer: Self-pay | Admitting: *Deleted

## 2015-06-04 ENCOUNTER — Other Ambulatory Visit: Payer: Self-pay | Admitting: Internal Medicine

## 2015-06-04 ENCOUNTER — Encounter: Payer: Self-pay | Admitting: Internal Medicine

## 2015-06-04 ENCOUNTER — Ambulatory Visit (INDEPENDENT_AMBULATORY_CARE_PROVIDER_SITE_OTHER): Payer: Commercial Managed Care - HMO | Admitting: Internal Medicine

## 2015-06-04 ENCOUNTER — Other Ambulatory Visit (INDEPENDENT_AMBULATORY_CARE_PROVIDER_SITE_OTHER): Payer: Commercial Managed Care - HMO

## 2015-06-04 VITALS — BP 128/86 | HR 113 | Ht 67.0 in | Wt 152.0 lb

## 2015-06-04 DIAGNOSIS — J948 Other specified pleural conditions: Secondary | ICD-10-CM

## 2015-06-04 DIAGNOSIS — J90 Pleural effusion, not elsewhere classified: Secondary | ICD-10-CM

## 2015-06-04 DIAGNOSIS — R05 Cough: Secondary | ICD-10-CM | POA: Diagnosis not present

## 2015-06-04 DIAGNOSIS — R059 Cough, unspecified: Secondary | ICD-10-CM

## 2015-06-04 LAB — SEDIMENTATION RATE: SED RATE: 38 mm/h — AB (ref 0–22)

## 2015-06-04 LAB — BASIC METABOLIC PANEL
BUN: 12 mg/dL (ref 6–23)
CALCIUM: 9 mg/dL (ref 8.4–10.5)
CO2: 32 meq/L (ref 19–32)
CREATININE: 0.9 mg/dL (ref 0.40–1.20)
Chloride: 102 mEq/L (ref 96–112)
GFR: 84.86 mL/min (ref >60.00)
GLUCOSE: 85 mg/dL (ref 70–99)
Potassium: 3.1 mEq/L — ABNORMAL LOW (ref 3.5–5.1)
Sodium: 142 mEq/L (ref 135–145)

## 2015-06-04 LAB — HEPATIC FUNCTION PANEL
ALBUMIN: 3.8 g/dL (ref 3.5–5.2)
ALT: 10 U/L (ref 0–35)
AST: 15 U/L (ref 0–37)
Alkaline Phosphatase: 105 U/L (ref 39–117)
BILIRUBIN DIRECT: 0.1 mg/dL (ref 0.0–0.3)
TOTAL PROTEIN: 7.5 g/dL (ref 6.0–8.3)
Total Bilirubin: 0.5 mg/dL (ref 0.2–1.2)

## 2015-06-04 LAB — CBC WITH DIFFERENTIAL/PLATELET
Basophils Absolute: 0 10*3/uL (ref 0.0–0.1)
Basophils Relative: 0.2 % (ref 0.0–3.0)
EOS PCT: 1.9 % (ref 0.0–5.0)
Eosinophils Absolute: 0.2 10*3/uL (ref 0.0–0.7)
HCT: 38.4 % (ref 36.0–46.0)
Hemoglobin: 12.7 g/dL (ref 12.0–15.0)
LYMPHS ABS: 0.8 10*3/uL (ref 0.7–4.0)
Lymphocytes Relative: 8.7 % — ABNORMAL LOW (ref 12.0–46.0)
MCHC: 33.2 g/dL (ref 30.0–36.0)
MCV: 87.3 fl (ref 78.0–100.0)
MONOS PCT: 13.7 % — AB (ref 3.0–12.0)
Monocytes Absolute: 1.2 10*3/uL — ABNORMAL HIGH (ref 0.1–1.0)
NEUTROS PCT: 75.5 % (ref 43.0–77.0)
Neutro Abs: 6.6 10*3/uL (ref 1.4–7.7)
Platelets: 277 10*3/uL (ref 150.0–400.0)
RBC: 4.4 Mil/uL (ref 3.87–5.11)
RDW: 13.8 % (ref 11.5–15.5)
WBC: 8.8 10*3/uL (ref 4.0–10.5)

## 2015-06-04 LAB — PROTIME-INR
INR: 1.2 ratio — AB (ref 0.8–1.0)
PROTHROMBIN TIME: 13 s (ref 9.6–13.1)

## 2015-06-04 MED ORDER — FAMOTIDINE 20 MG PO TABS: ORAL_TABLET | ORAL | Status: DC

## 2015-06-04 MED ORDER — POTASSIUM CHLORIDE CRYS ER 20 MEQ PO TBCR: 20.0000 meq | EXTENDED_RELEASE_TABLET | Freq: Every day | ORAL | Status: DC

## 2015-06-04 MED ORDER — PANTOPRAZOLE SODIUM 40 MG PO TBEC: 40.0000 mg | DELAYED_RELEASE_TABLET | Freq: Every day | ORAL | Status: DC

## 2015-06-04 NOTE — Progress Notes (Signed)
Subjective:    Patient ID: Jamie [name], female    DOB: 10/11/1965   MRN: [id number]    Brief patient profile:  49 yobf never smoker works Pharmacist, hospital at [employer] [facility] new onset respiratory problems just starting Jan 2014 referred by Dr Alain Marion for eval of ? Pna s/p vats bx 10/18/12 for nodular lung dz ? Etiology with no specific dx per [facility]stein     History of Present Illness  Arthritis assoc with rash, better with prednisone, Dr Charlestine Night thought it was RA with rec mtx , then abrupt R CP > admit with MPN's and bx 9/5/014 on L > no dx per LKatzenstein > rx 40mg  per day > referred for [facility] eval in Rheumatology     10/28/2012 post hosp f/u re ? ILD has not been seen at [facility], still on 20 mg per day Chief Complaint  Patient presents with  . Hospitalization Follow-up    States feels better since leaving the hospital but is not at 100%.  cough is variable, mostly dry, worse when bend over,   R pleuritic cp much better, rash and arthritis improving but mostly in shoulders and hips, sparing hands and ankles and feet. rec Keep Wake appt to evaluated arthrititis and rash Stay on 40 mg prednisone daily until seen > tapered to 10 mg per days     12/03/2012 f/u ov/Jamie [name] re: cough/ nodular ILD /arthritis now followed at [facility] > tapered pred to 10/day Chief Complaint  Patient presents with  . Follow-up    Pt c/o cough progressively worse since last visit. She states cough seems worse with talking.  Cough is non prod.   rec Take delsym two tsp every 12 hours and supplement if needed with  Percocet 1  every 4 hours to suppress the urge to cough.   Once you have eliminated the cough for 3 straight days percocet ,  then the delsym as tolerated.  Pantoprazole (protonix) 40 mg   Take 30-60 min before first meal of the day and Pepcid 20 mg one bedtime until return to office - this is the best way to tell whether stomach acid is contributing to your problem.      12/18/2012 f/u ov/Jamie [name] re: PF/ ?  Rheumatoid  Chief Complaint  Patient presents with  . HFU    C/o dry cough, some hoarseness. No increase SOB, no wheezing, no chest tx  pred still at 10 mg daily  Cough even percocet no change  tussionex helping at hs  rec tussionex one half tsp in am and full tsp  at bedtime We may need to consider an open biopsy on one of the cavitary nodules on the Right side if your rheumatologist is not able to shed any light on why you have them  Please schedule a follow up office visit in 2 weeks, sooner if needed with cxr on return  Late add : consider [facility] second opinion> Dermatomyositis> rec r/u all at [facility] rheum/derm/pulmonary    06/04/2015  Consultation/ Jamie [name] re: L effusion/ recrrent cough  Chief Complaint  Patient presents with  . Pulmonary Consult    Referred by Dr. Alain Marion. Pt c/o cough x 2 wks- non prod and worse lying down. She gets SOB if she has to walk a long distance.    followed by Lucia Gaskins at [facility] yearly on pred 5 mg daily - felt lousy on 2.5  Coughs x years somewhat pred responsive but "always coughs" mostly day mostly then 2 weeks  prior to OV  Severe cough to point of choking/ chest discomfort in midline and more sob than baseline eg across cone to ER to cafeteria and feels odd on Left side when lies down "like shifting"  No longer gerd rx or dulera    No obvious daytime variabilty or assoc purulent sputum or chest tightness, subjective wheeze overt sinus or hb symptoms. No unusual exp hx or h/o childhood pna/ asthma or premature birth to her knowledge.    Also denies any obvious fluctuation of symptoms with weather or environmental changes or other aggravating or alleviating factors except as outlined above.  Current Medications, Allergies, Complete Past Medical History, Past Surgical History, Family History, and Social History were reviewed in [employer] record.  ROS  The following are not active complaints unless bolded sore throat, dysphagia,  dental problems, itching, sneezing,  nasal congestion or excess/ purulent secretions, ear ache,   fever, chills, sweats, unintended wt loss, pleuritic cp or exertional cp, hemoptysis,  orthopnea pnd or leg swelling, presyncope, palpitations, heartburn, abdominal pain, anorexia, nausea, vomiting, diarrhea  or change in bowel or urinary habits, change in stools or urine, dysuria,hematuria,  Rash and  Arthralgias  Resolved  visual complaints, headache, numbness weakness or ataxia or problems with walking or coordination,  change in mood/affect or memory.              Objective:   Physical Exam  10/28/2012     148   >  12/03/2012  151 > 12/18/2012  147 > 135 03/13/2013 > 06/04/2015 152    03/08/12 159 lb 12.8 oz (72.485 kg)  03/06/12 162 lb (73.483 kg)  02/13/12 165 lb 4 oz (74.957 kg)    amb bf with dry cough and throat clearing   HEENT: nl dentition, turbinates, and orophanx. Nl external ear canals without cough reflex No crepitance in neck    NECK :  without JVD/Nodes/TM/ nl carotid upstrokes bilaterally   LUNGS: no acc muscle use,  Min decrease BS L base    CV:  RRR  no s3 or murmur or increase in P2, no edema - neg Haman's  ABD:  soft and nontender with nl excursion in the supine position. No bruits or organomegaly, bowel sounds nl  MS:  warm without deformities, calf tenderness, cyanosis or clubbing  SKIN: warm and dry with punctate hyperpigmented ulcerations both hands  NEURO:  alert, approp, no deficits       I personally reviewed images and agree with radiology impression as follows:  CXR:  06/03/15 Moderate-sized loculated pleural fluid collection on the left. Could not exclude empyema. Recommend chest CT with contrast for further evaluation.  Chronic lung disease with scarring changes.       Assessment & Plan:

## 2015-06-04 NOTE — Assessment & Plan Note (Signed)
Most c/w uacs, not ILD as not reproducible on insp   Classic Upper airway cough syndrome, so named because it's frequently impossible to sort out how much is  CR/sinusitis with freq throat clearing (which can be related to primary GERD)   vs  causing  secondary (" extra esophageal")  GERD from wide swings in gastric pressure that occur with throat clearing, often  promoting self use of mint and menthol lozenges that reduce the lower esophageal sphincter tone and exacerbate the problem further in a cyclical fashion.   These are the same pts (now being labeled as having "irritable larynx syndrome" by some cough centers) who not infrequently have a history of having failed to tolerate ace inhibitors,  dry powder inhalers or biphosphonates or report having atypical reflux symptoms that don't respond to standard doses of PPI , and are easily confused as having aecopd or asthma flares by even experienced allergists/ pulmonologists.    For now max rx for gerd and address effusion/ defer to [facility] pulmonary for longterm f/u

## 2015-06-04 NOTE — Patient Instructions (Addendum)
Pantoprazole (protonix) 40 mg   Take  30-60 min before first meal of the day and Pepcid (famotidine)  20 mg one @  bedtime until  Cough back to normal   Delsym during the day is the strongest  Cough medication You can buy and won't make you sleepy  GERD (REFLUX)  is an extremely common cause of respiratory symptoms just like yours , many times with no obvious heartburn at all.    It can be treated with medication, but also with lifestyle changes including elevation of the head of your bed (ideally with 6 inch  bed blocks),  Smoking cessation, avoidance of late meals, excessive alcohol, and avoid fatty foods, chocolate, peppermint, colas, red wine, and acidic juices such as orange juice.  NO MINT OR MENTHOL PRODUCTS SO NO COUGH DROPS  USE SUGARLESS CANDY INSTEAD (Jolley ranchers or Stover's or Life Savers) or even ice chips will also do - the key is to swallow to prevent all throat clearing. NO OIL BASED VITAMINS - use powdered substitutes.   Please see patient coordinator before you leave today  to schedule Left thoracentesis next available

## 2015-06-04 NOTE — Assessment & Plan Note (Signed)
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   Total time devoted to counseling  = 35/26m review case with pt/ discussion of options/alternatives/ personally creating in presence of pt  then going over specific  Instructions directly with the pt including how to use all of the meds but in particular covering each new medication in detail (see avs)

## 2015-06-07 ENCOUNTER — Other Ambulatory Visit: Payer: Commercial Managed Care - HMO

## 2015-06-07 ENCOUNTER — Ambulatory Visit: Payer: Commercial Managed Care - HMO | Admitting: Internal Medicine

## 2015-06-08 ENCOUNTER — Ambulatory Visit (INDEPENDENT_AMBULATORY_CARE_PROVIDER_SITE_OTHER)
Admission: RE | Admit: 2015-06-08 | Discharge: 2015-06-08 | Disposition: A | Discharge status: Home / Self Care | Payer: Commercial Managed Care - HMO | Source: Ambulatory Visit | Attending: Internal Medicine | Admitting: Internal Medicine

## 2015-06-08 DIAGNOSIS — J90 Pleural effusion, not elsewhere classified: Secondary | ICD-10-CM | POA: Diagnosis not present

## 2015-06-08 MED ORDER — IOPAMIDOL (ISOVUE-300) INJECTION 61%
80.0000 mL | Freq: Once | INTRAVENOUS | Status: AC | PRN
  Administered 2015-06-08: 80 mL via INTRAVENOUS

## 2015-06-10 ENCOUNTER — Telehealth: Payer: Self-pay | Admitting: Internal Medicine

## 2015-06-10 NOTE — Telephone Encounter (Signed)
Relation to [id number] Call back number:[phone]   Reason for call:  Patient inquiring about CT results taken 06/08/15

## 2015-06-11 ENCOUNTER — Ambulatory Visit (HOSPITAL_COMMUNITY)
Admission: RE | Admit: 2015-06-11 | Discharge: 2015-06-11 | Disposition: A | Discharge status: Home / Self Care | Payer: Commercial Managed Care - HMO | Source: Ambulatory Visit | Attending: Internal Medicine | Admitting: Internal Medicine

## 2015-06-11 ENCOUNTER — Ambulatory Visit (HOSPITAL_COMMUNITY)
Admission: RE | Admit: 2015-06-11 | Discharge: 2015-06-11 | Disposition: A | Discharge status: Home / Self Care | Payer: Commercial Managed Care - HMO | Source: Ambulatory Visit | Attending: Radiology | Admitting: Radiology

## 2015-06-11 ENCOUNTER — Ambulatory Visit (HOSPITAL_COMMUNITY): Payer: Commercial Managed Care - HMO

## 2015-06-11 DIAGNOSIS — Z9889 Other specified postprocedural states: Secondary | ICD-10-CM | POA: Insufficient documentation

## 2015-06-11 DIAGNOSIS — J90 Pleural effusion, not elsewhere classified: Secondary | ICD-10-CM

## 2015-06-11 DIAGNOSIS — R918 Other nonspecific abnormal finding of lung field: Secondary | ICD-10-CM | POA: Insufficient documentation

## 2015-06-11 DIAGNOSIS — J948 Other specified pleural conditions: Secondary | ICD-10-CM | POA: Insufficient documentation

## 2015-06-11 LAB — BODY FLUID CELL COUNT WITH DIFFERENTIAL
EOS FL: 3 %
LYMPHS FL: 18 %
MONOCYTE-MACROPHAGE-SEROUS FLUID: 5 % — AB (ref 50–90)
Neutrophil Count, Fluid: 74 % — ABNORMAL HIGH (ref 0–25)
Total Nucleated Cell Count, Fluid: UNDETERMINED cu mm (ref 0–1000)

## 2015-06-11 LAB — LACTATE DEHYDROGENASE, PLEURAL OR PERITONEAL FLUID: LD, Fluid: 1657 U/L — ABNORMAL HIGH (ref 3–23)

## 2015-06-11 LAB — GLUCOSE, SEROUS FLUID: Glucose, Fluid: 22 mg/dL

## 2015-06-11 LAB — PROTEIN, BODY FLUID

## 2015-06-11 MED ORDER — LIDOCAINE HCL (PF) 1 % IJ SOLN
INTRAMUSCULAR | Status: AC
  Filled 2015-06-11: qty 10

## 2015-06-11 NOTE — Procedures (Signed)
Severely/densely loculated posterior-lateral left effusion with thickened pleural rind. US guided left thoracentesis yielded only 1 mL of dark bloody fluid. Pt tolerated procedure well. No immediate complications.  Specimen was sent for labs. CXR ordered.  Ascencion Dike PA-C 06/11/2015 1:40 PM

## 2015-06-11 NOTE — Telephone Encounter (Signed)
Left detailed mess informing pt of below.   Entered by Cassandria Anger, MD at 06/08/2015 9:04 PM    Dear Hoyle Sauer,  Please follow up with Dr Melvyn Novas.  Sincerely,  Walker Kehr, MD

## 2015-06-14 ENCOUNTER — Telehealth: Payer: Self-pay | Admitting: Internal Medicine

## 2015-06-14 LAB — PATHOLOGIST SMEAR REVIEW

## 2015-06-14 NOTE — Telephone Encounter (Signed)
Pt states that she is still having cough and SOB. Had Thoracentesis 06/11/15 and they were unable to get a lot of fluid d/t scar tissue. Pt is wanting to know what needs to be done now seeing that she is still having symptoms. Please advise Dr Melvyn Novas. Thanks.

## 2015-06-14 NOTE — Telephone Encounter (Signed)
The cytology is still pending  But likely will need a VATS - I can line that up with our T surgeons now or first see her back here to go over limited options in more detail but nothing else to offer at this point in terms of medical rx

## 2015-06-14 NOTE — Telephone Encounter (Signed)
Pt was seen by MW on 06/04/15 with the following instructions:  Patient Instructions     Pantoprazole (protonix) 40 mg Take 30-60 min before first meal of the day and Pepcid (famotidine) 20 mg one @ bedtime until Cough back to normal   Delsym during the day is the strongest Cough medication You can buy and won't make you sleepy  GERD (REFLUX) is an extremely common cause of respiratory symptoms just like yours , many times with no obvious heartburn at all.   It can be treated with medication, but also with lifestyle changes including elevation of the head of your bed (ideally with 6 inch bed blocks), Smoking cessation, avoidance of late meals, excessive alcohol, and avoid fatty foods, chocolate, peppermint, colas, red wine, and acidic juices such as orange juice.  NO MINT OR MENTHOL PRODUCTS SO NO COUGH DROPS  USE SUGARLESS CANDY INSTEAD (Jolley ranchers or Stover's or Life Savers) or even ice chips will also do - the key is to swallow to prevent all throat clearing. NO OIL BASED VITAMINS - use powdered substitutes.  Please see patient coordinator before you leave today to schedule Left thoracentesis next available   -------  lmomtcb for pt

## 2015-06-14 NOTE — Telephone Encounter (Signed)
Spoke with pt, aware of below.  Will forward back to MW to update as cytology is finalized.

## 2015-06-14 NOTE — Telephone Encounter (Signed)
Returning phone call [phone]

## 2015-06-15 LAB — BODY FLUID CULTURE: Culture: NO GROWTH

## 2015-06-17 NOTE — Telephone Encounter (Signed)
lmomtcb for pt - is aware of below recs per MW?

## 2015-06-17 NOTE — Telephone Encounter (Signed)
Spoke with patient - states that she has not received any results yet. Aware that as of 06/14/2015 the Cytology was still pending. Pt aware that we will check with MW again to see if he has received any results and also let him know that the patient wishes to wait until all results are back to decide what she wants to do. Pt states that she may decide to come back in and see MW prior to anything being set up. Will send to Dr Melvyn Novas to advise.

## 2015-06-17 NOTE — Telephone Encounter (Signed)
Patient is calling to check on results and next step once again, CB is [phone]

## 2015-06-17 NOTE — Telephone Encounter (Signed)
See duplicate note. 

## 2015-06-17 NOTE — Telephone Encounter (Signed)
[phone], pt cb

## 2015-06-18 ENCOUNTER — Telehealth: Payer: Self-pay | Admitting: Internal Medicine

## 2015-06-18 NOTE — Telephone Encounter (Signed)
Per pt's chart: Notes Recorded by Rosana Berger, CMA on 06/18/2015 at 9:59 AM lmtcb Notes Recorded by Rosana Berger, CMA on 06/18/2015 at 9:59 AM lmtcb Notes Recorded by Tanda Rockers, MD on 06/18/2015 at 5:31 AM Call patient :  Studies are non diagnostic but suggestive of empyema rec ov to discuss referral to T surgery as next step Notes Recorded by Tanda Rockers, MD on 06/16/2015 at 2:34 PM Call patient :  Study is inconclusive, let her know we are still waiting on final (cytology need to call lab and find out why)  Called pt at work, she has left for the day LMOM TCB x1 for pt on her cell

## 2015-06-18 NOTE — Telephone Encounter (Signed)
Wants to go to [facility] for T surgery consult, will discuss with her [place] pulmonary doctor = Lucia Gaskins

## 2015-06-18 NOTE — Progress Notes (Signed)
Quick Note:  lmtcb ______ 

## 2015-06-21 NOTE — Telephone Encounter (Signed)
Left message for patient to call back  

## 2015-06-21 NOTE — Telephone Encounter (Signed)
Notes Recorded by Tanda Rockers, MD on 06/18/2015 at 5:31 AM Call patient : Studies are non diagnostic but suggestive of empyema rec ov to discuss referral to T surgery as next step  Left message for patient to call back.

## 2015-06-21 NOTE — Telephone Encounter (Signed)
Patient returned call, CB is [phone]

## 2015-06-22 NOTE — Telephone Encounter (Signed)
Spoke with pt. She is aware of her results, states that she spoke with MW on Friday. Pt is deciding if she wants to see thoracic surgery here or at [facility]. Once she makes her decision she will call us.

## 2015-06-23 ENCOUNTER — Telehealth: Payer: Self-pay | Admitting: Internal Medicine

## 2015-06-23 DIAGNOSIS — J90 Pleural effusion, not elsewhere classified: Secondary | ICD-10-CM

## 2015-06-23 NOTE — Telephone Encounter (Signed)
LMTCB

## 2015-06-23 NOTE — Telephone Encounter (Signed)
Spoke with pt and she would like to continue with referral to CTS. Pt advised that someone will contact her to schedule consult appt. Order placed. Nothing further needed.

## 2015-06-28 ENCOUNTER — Emergency Department (HOSPITAL_COMMUNITY): Payer: Commercial Managed Care - HMO

## 2015-06-28 ENCOUNTER — Inpatient Hospital Stay (HOSPITAL_COMMUNITY)
Admission: EM | Admit: 2015-06-28 | Discharge: 2015-07-06 | DRG: 164 | Disposition: A | Discharge status: Home / Self Care | Payer: Commercial Managed Care - HMO | Attending: Thoracic Surgery (Cardiothoracic Vascular Surgery) | Admitting: Thoracic Surgery (Cardiothoracic Vascular Surgery)

## 2015-06-28 ENCOUNTER — Encounter (HOSPITAL_COMMUNITY): Payer: Self-pay | Admitting: Emergency Medicine

## 2015-06-28 ENCOUNTER — Observation Stay (HOSPITAL_BASED_OUTPATIENT_CLINIC_OR_DEPARTMENT_OTHER): Payer: Commercial Managed Care - HMO

## 2015-06-28 DIAGNOSIS — J941 Fibrothorax: Secondary | ICD-10-CM | POA: Diagnosis present

## 2015-06-28 DIAGNOSIS — J90 Pleural effusion, not elsewhere classified: Secondary | ICD-10-CM

## 2015-06-28 DIAGNOSIS — Z7952 Long term (current) use of systemic steroids: Secondary | ICD-10-CM

## 2015-06-28 DIAGNOSIS — Z792 Long term (current) use of antibiotics: Secondary | ICD-10-CM

## 2015-06-28 DIAGNOSIS — J869 Pyothorax without fistula: Secondary | ICD-10-CM | POA: Diagnosis present

## 2015-06-28 DIAGNOSIS — J9811 Atelectasis: Secondary | ICD-10-CM | POA: Diagnosis not present

## 2015-06-28 DIAGNOSIS — R079 Chest pain, unspecified: Secondary | ICD-10-CM | POA: Diagnosis not present

## 2015-06-28 DIAGNOSIS — I10 Essential (primary) hypertension: Secondary | ICD-10-CM | POA: Diagnosis not present

## 2015-06-28 DIAGNOSIS — D649 Anemia, unspecified: Secondary | ICD-10-CM | POA: Diagnosis not present

## 2015-06-28 DIAGNOSIS — R55 Syncope and collapse: Secondary | ICD-10-CM | POA: Diagnosis present

## 2015-06-28 DIAGNOSIS — M0520 Rheumatoid vasculitis with rheumatoid arthritis of unspecified site: Secondary | ICD-10-CM | POA: Diagnosis present

## 2015-06-28 DIAGNOSIS — Z09 Encounter for follow-up examination after completed treatment for conditions other than malignant neoplasm: Secondary | ICD-10-CM

## 2015-06-28 DIAGNOSIS — E860 Dehydration: Secondary | ICD-10-CM | POA: Diagnosis present

## 2015-06-28 DIAGNOSIS — J942 Hemothorax: Principal | ICD-10-CM | POA: Diagnosis present

## 2015-06-28 DIAGNOSIS — K219 Gastro-esophageal reflux disease without esophagitis: Secondary | ICD-10-CM | POA: Diagnosis not present

## 2015-06-28 DIAGNOSIS — I313 Pericardial effusion (noninflammatory): Secondary | ICD-10-CM | POA: Diagnosis not present

## 2015-06-28 DIAGNOSIS — R918 Other nonspecific abnormal finding of lung field: Secondary | ICD-10-CM | POA: Diagnosis present

## 2015-06-28 LAB — URINALYSIS, ROUTINE W REFLEX MICROSCOPIC
Bilirubin Urine: NEGATIVE
GLUCOSE, UA: NEGATIVE mg/dL
Hgb urine dipstick: NEGATIVE
Ketones, ur: NEGATIVE mg/dL
LEUKOCYTES UA: NEGATIVE
Nitrite: NEGATIVE
PH: 7.5 (ref 5.0–8.0)
PROTEIN: NEGATIVE mg/dL
SPECIFIC GRAVITY, URINE: 1.024 (ref 1.005–1.030)

## 2015-06-28 LAB — BASIC METABOLIC PANEL
Anion gap: 11 (ref 5–15)
BUN: 11 mg/dL (ref 6–20)
CHLORIDE: 103 mmol/L (ref 101–111)
CO2: 24 mmol/L (ref 22–32)
Calcium: 9.2 mg/dL (ref 8.9–10.3)
Creatinine, Ser: 0.94 mg/dL (ref 0.44–1.00)
GFR calc non Af Amer: >60 mL/min (ref >60)
Glucose, Bld: 91 mg/dL (ref 65–99)
POTASSIUM: 3.1 mmol/L — AB (ref 3.5–5.1)
Sodium: 138 mmol/L (ref 135–145)

## 2015-06-28 LAB — CBG MONITORING, ED: Glucose-Capillary: 82 mg/dL (ref 65–99)

## 2015-06-28 LAB — CBC
HEMATOCRIT: 42.4 % (ref 36.0–46.0)
HEMOGLOBIN: 13.6 g/dL (ref 12.0–15.0)
MCH: 28.5 pg (ref 26.0–34.0)
MCHC: 32.1 g/dL (ref 30.0–36.0)
MCV: 88.9 fL (ref 78.0–100.0)
Platelets: 182 10*3/uL (ref 150–400)
RBC: 4.77 MIL/uL (ref 3.87–5.11)
RDW: 13.6 % (ref 11.5–15.5)
WBC: 10.3 10*3/uL (ref 4.0–10.5)

## 2015-06-28 LAB — D-DIMER, QUANTITATIVE (NOT AT ARMC): D DIMER QUANT: 3.21 ug{FEU}/mL — AB (ref 0.00–0.50)

## 2015-06-28 LAB — PROTIME-INR
INR: 1.15 (ref 0.00–1.49)
Prothrombin Time: 14.9 seconds (ref 11.6–15.2)

## 2015-06-28 LAB — PHOSPHORUS: PHOSPHORUS: 3.3 mg/dL (ref 2.5–4.6)

## 2015-06-28 LAB — APTT: aPTT: 30 seconds (ref 24–37)

## 2015-06-28 LAB — I-STAT TROPONIN, ED: Troponin i, poc: 0 ng/mL (ref 0.00–0.08)

## 2015-06-28 LAB — TROPONIN I
Troponin I: <0.03 ng/mL (ref <0.031)
Troponin I: <0.03 ng/mL (ref <0.031)

## 2015-06-28 LAB — MAGNESIUM: MAGNESIUM: 2.1 mg/dL (ref 1.7–2.4)

## 2015-06-28 LAB — TSH: TSH: 0.659 u[IU]/mL (ref 0.350–4.500)

## 2015-06-28 MED ORDER — ONDANSETRON HCL 4 MG PO TABS: 4.0000 mg | ORAL_TABLET | Freq: Four times a day (QID) | ORAL | Status: DC | PRN

## 2015-06-28 MED ORDER — SODIUM CHLORIDE 0.9 % IV BOLUS (SEPSIS)
1000.0000 mL | Freq: Once | INTRAVENOUS | Status: AC
  Administered 2015-06-28: 1000 mL via INTRAVENOUS

## 2015-06-28 MED ORDER — AMLODIPINE BESYLATE 10 MG PO TABS
10.0000 mg | ORAL_TABLET | Freq: Every day | ORAL | Status: DC
  Administered 2015-06-29 – 2015-07-05 (×6): 10 mg via ORAL
  Filled 2015-06-28 (×6): qty 1

## 2015-06-28 MED ORDER — HYDROXYCHLOROQUINE SULFATE 200 MG PO TABS: 300.0000 mg | ORAL_TABLET | ORAL | Status: DC

## 2015-06-28 MED ORDER — SODIUM CHLORIDE 0.9% FLUSH
3.0000 mL | Freq: Two times a day (BID) | INTRAVENOUS | Status: DC
  Administered 2015-06-29 – 2015-06-30 (×2): 3 mL via INTRAVENOUS

## 2015-06-28 MED ORDER — ACETAMINOPHEN 325 MG PO TABS: 650.0000 mg | ORAL_TABLET | ORAL | Status: DC | PRN

## 2015-06-28 MED ORDER — GI COCKTAIL ~~LOC~~: 30.0000 mL | Freq: Four times a day (QID) | ORAL | Status: DC | PRN

## 2015-06-28 MED ORDER — SODIUM CHLORIDE 0.9% FLUSH
3.0000 mL | Freq: Two times a day (BID) | INTRAVENOUS | Status: DC
  Administered 2015-06-30 (×2): 3 mL via INTRAVENOUS

## 2015-06-28 MED ORDER — IOPAMIDOL (ISOVUE-370) INJECTION 76%
INTRAVENOUS | Status: AC
  Administered 2015-06-28: 100 mL
  Filled 2015-06-28: qty 100

## 2015-06-28 MED ORDER — ACETAMINOPHEN 650 MG RE SUPP: 650.0000 mg | Freq: Four times a day (QID) | RECTAL | Status: DC | PRN

## 2015-06-28 MED ORDER — ONDANSETRON HCL 4 MG/2ML IJ SOLN: 4.0000 mg | Freq: Four times a day (QID) | INTRAMUSCULAR | Status: DC | PRN

## 2015-06-28 MED ORDER — OXYCODONE HCL 5 MG PO TABS: 5.0000 mg | ORAL_TABLET | ORAL | Status: DC | PRN

## 2015-06-28 MED ORDER — PANTOPRAZOLE SODIUM 40 MG PO TBEC: 40.0000 mg | DELAYED_RELEASE_TABLET | Freq: Every day | ORAL | Status: DC | PRN

## 2015-06-28 MED ORDER — POTASSIUM CHLORIDE CRYS ER 20 MEQ PO TBCR
20.0000 meq | EXTENDED_RELEASE_TABLET | Freq: Every day | ORAL | Status: DC
  Administered 2015-06-29 – 2015-06-30 (×2): 20 meq via ORAL
  Filled 2015-06-28 (×2): qty 1

## 2015-06-28 MED ORDER — PROMETHAZINE-CODEINE 6.25-10 MG/5ML PO SYRP: 5.0000 mL | ORAL_SOLUTION | Freq: Four times a day (QID) | ORAL | Status: DC | PRN

## 2015-06-28 MED ORDER — ACETAMINOPHEN 325 MG PO TABS: 650.0000 mg | ORAL_TABLET | Freq: Four times a day (QID) | ORAL | Status: DC | PRN

## 2015-06-28 MED ORDER — HYDROXYCHLOROQUINE SULFATE 200 MG PO TABS
400.0000 mg | ORAL_TABLET | ORAL | Status: DC
  Administered 2015-06-30 – 2015-07-04 (×3): 400 mg via ORAL
  Filled 2015-06-28 (×3): qty 2

## 2015-06-28 MED ORDER — SODIUM CHLORIDE 0.9 % IV SOLN: INTRAVENOUS | Status: DC

## 2015-06-28 MED ORDER — PREDNISONE 1 MG PO TABS
4.0000 mg | ORAL_TABLET | Freq: Every day | ORAL | Status: DC
  Administered 2015-06-29 – 2015-06-30 (×2): 4 mg via ORAL
  Filled 2015-06-28 (×2): qty 4

## 2015-06-28 MED ORDER — MYCOPHENOLATE MOFETIL 250 MG PO CAPS
1000.0000 mg | ORAL_CAPSULE | Freq: Two times a day (BID) | ORAL | Status: DC
  Administered 2015-06-28 – 2015-06-30 (×5): 1000 mg via ORAL
  Filled 2015-06-28 (×5): qty 4

## 2015-06-28 MED ORDER — SODIUM CHLORIDE 0.9 % IV SOLN
INTRAVENOUS | Status: DC
Start: 2015-06-28 — End: 2015-06-29
  Administered 2015-06-28 (×2): via INTRAVENOUS

## 2015-06-28 MED ORDER — HYDROXYCHLOROQUINE SULFATE 200 MG PO TABS
300.0000 mg | ORAL_TABLET | ORAL | Status: DC
  Administered 2015-06-29 – 2015-07-05 (×3): 300 mg via ORAL
  Filled 2015-06-28 (×4): qty 2

## 2015-06-28 MED ORDER — POTASSIUM CHLORIDE CRYS ER 20 MEQ PO TBCR
40.0000 meq | EXTENDED_RELEASE_TABLET | Freq: Once | ORAL | Status: AC
  Administered 2015-06-28: 40 meq via ORAL
  Filled 2015-06-28: qty 2

## 2015-06-28 NOTE — ED Provider Notes (Signed)
TIME SEEN: 4:10 AM  CHIEF COMPLAINT: Chest pain, shortness of breath, syncope  HPI: Pt is a 50 y.o. female with history of hypertension, rheumatoid arthritis, "connective tissue disorder" who presents emergency department after syncopal event. Patient works in Dentist in the emergency department and states she was helping get a patient registered when she began to feel left-sided chest pain, shortness of breath, became diaphoretic and had a syncopal event. Patient reports she fell into the patient she was attempting to register and did not hit her head. Denies any recent fevers, cough, vomiting or diarrhea. No bloody stools, melena, vaginal bleeding. She states last time she has had a syncopal event was in her early 68s. States she was recently told that she had "fluid on my left lung" and is scheduled to see a cardiothoracic surgeon this week. Denies history of PE or DVT. No lower extremity swelling or pain. Denies headache, numbness, tingling or focal weakness. Is still having some mild left-sided sharp chest pain.   Patient is being followed by Dr. Melvyn Novas with pulmonology. She has had a thoracentesis to further characterize this fluid the end of April 2017.   PCP - Plotnikov  ROS: See HPI Constitutional: no fever  Eyes: no drainage  ENT: no runny nose   Cardiovascular:   chest pain  Resp:  SOB  GI: no vomiting GU: no dysuria Integumentary: no rash  Allergy: no hives  Musculoskeletal: no leg swelling  Neurological: no slurred speech ROS otherwise negative  PAST MEDICAL HISTORY/PAST SURGICAL HISTORY:  Past Medical History  Diagnosis Date  . Hypertension   . Multiple thyroid nodules   . History of cold sores   . 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 Photosensitivity 04/17/2012    3/14 new   . Pulmonary nodules/lesions, multiple 10/13/2012  . 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 Dysrhythmia     ST/notes 12/10/2012  . Pneumonia 02/2012    "once" (12/10/2012)  . 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 Rheumatoid arthritis ([place])     "legs; arms" (12/10/2012)  . Pneumomediastinum ([place]) 03/03/2013    MEDICATIONS:  Prior to Admission medications   Medication Sig Start Date End Date Taking? Authorizing Provider  amLODipine (NORVASC) 10 MG tablet Take 1 tablet by mouth daily. 03/18/15   Historical Provider, MD  famotidine (PEPCID) 20 MG tablet One at bedtime 06/04/15   Tanda Rockers, MD  hydroxychloroquine (PLAQUENIL) 200 MG tablet Take 200 mg by mouth 2 (two) times daily. 07/23/14   Historical Provider, MD  mycophenolate (CELLCEPT) 500 MG tablet Take 2 tablets by mouth 2 (two) times daily. 08/26/14   Historical Provider, MD  pantoprazole (PROTONIX) 40 MG tablet Take 1 tablet (40 mg total) by mouth daily. Take 30-60 min before first meal of the day 06/04/15   Tanda Rockers, MD  potassium chloride SA (K-DUR,KLOR-CON) 20 MEQ tablet Take 1 tablet (20 mEq total) by mouth daily. 06/04/15   Aleksei Plotnikov V, MD  predniSONE (DELTASONE) 5 MG tablet Take 1 tablet by mouth daily. 10/01/14   Historical Provider, MD  promethazine-codeine (PHENERGAN WITH CODEINE) 6.25-10 MG/5ML syrup Take 5 mLs by mouth every 6 (six) hours as needed for cough. 06/03/15   Cassandria Anger, MD    ALLERGIES:  Allergies  Allergen Reactions  . Benazepril Hcl     REACTION: cough  .  Tramadol     n/v  . Ciprofloxacin Rash    Joint pain REACTION: Rhabdomyolysis    SOCIAL HISTORY:  Social History  Substance Use Topics  . Smoking status: Never Smoker   . Smokeless tobacco: Never Used  . Alcohol Use: No    FAMILY HISTORY: Family History  Problem Relation Age of Onset  . Hypertension Other   . Hypertension Mother   . Cancer Father 79    prostate ca    EXAM: BP 135/89 mmHg  Pulse 89  Temp(Src) 97.6 F (36.4 C) (Oral)  Resp 26   Ht 5\' 7"  (1.702 m)  Wt 151 lb (68.493 kg)  BMI 23.64 kg/m2  SpO2 100% CONSTITUTIONAL: Alert and oriented and responds appropriately to questions. Well-appearing; well-nourished HEAD: Normocephalic EYES: Conjunctivae clear, PERRL ENT: normal nose; no rhinorrhea; moist mucous membranes NECK: Supple, no meningismus, no LAD  CARD: RRR; S1 and S2 appreciated; no murmurs, no clicks, no rubs, no gallops RESP: Normal chest excursion without splinting or tachypnea; breath sounds clear and equal bilaterally; no wheezes, no rhonchi, no rales, no hypoxia or respiratory distress, speaking full sentences ABD/GI: Normal bowel sounds; non-distended; soft, non-tender, no rebound, no guarding, no peritoneal signs BACK:  The back appears normal and is non-tender to palpation, there is no CVA tenderness EXT: Normal ROM in all joints; non-tender to palpation; no edema; normal capillary refill; no cyanosis, no calf tenderness or swelling    SKIN: Normal color for age and race; warm; no rash; diaphoretic NEURO: Moves all extremities equally, sensation to light touch intact diffusely, cranial nerves II through XII intact PSYCH: The patient's mood and manner are appropriate. Grooming and personal hygiene are appropriate.  MEDICAL DECISION MAKING: Patient here with syncopal event. Proceeded with chest pain and shortness of breath. Will obtain cardiac labs, d-dimer, chest x-ray. We'll also check orthostatic vital signs. She does not appear dehydrated on exam. No history of any recent bleeding, vomiting or diarrhea. EKG shows no ischemic abnormality, interval changes, arrhythmia. Blood glucose is normal.  ED PROGRESS: 5:50 AM  Pt's chest pain shortness of breath have resolved. Her labs are unremarkable other than slightly low potassium which we will replace. Troponin is negative. D-dimer is positive. We'll obtain a CT of her chest to evaluate for pulmonary embolus. She agrees with plan for admission for observation. She  states she would prefer to stay in the hospital then go home.   Patient is slightly orthostatic. Heart rate jumped from 95 to 113 with standing. Will give her IV fluids.   7:25 AM  Patient CT scan shows no pulmonary embolus. She has a stable loculated effusion on the left and persistent adjacent left lower lobe consolidation. There is some pleural enhancement which may be secondary to infection versus autoimmune vasculitis. There are also areas of cavitation in the periphery of the right upper lobe sequela from autoimmune vasculitis.  Patient states she is still feeling well without chest pain or shortness of breath currently. She states she's did feel very lightheaded when getting up to the bathroom. She is continuing to get IV hydration.  7:30 AM  Discussed patient's case with hospitalist, Dr. Marily Memos.  Recommend admission to telemetry, observation bed.  I will place holding orders per their request. Patient and family (if present) updated with plan. Care transferred to hospitalist service.  I reviewed all nursing notes, vitals, pertinent old records, EKGs, labs, imaging (as available).     EKG Interpretation  Date/Time:  Monday Jun 28 2015 04:00:29  EDT Ventricular Rate:  87 PR Interval:  132 QRS Duration: 82 QT Interval:  374 QTC Calculation: 450 R Axis:   52 Text Interpretation:  Normal sinus rhythm Possible Left atrial enlargement Left ventricular hypertrophy Abnormal ECG No significant change since last tracing other than rate is slower Confirmed by WARD,  DO, KRISTEN [id number]) on 06/28/2015 4:12:16 AM         [place], DO 06/28/15 [id number]

## 2015-06-28 NOTE — ED Notes (Signed)
Pt ambulated to restroom. 

## 2015-06-28 NOTE — Progress Notes (Signed)
  Echocardiogram 2D Echocardiogram has been performed.  Jamie [name] 06/28/2015, 1:47 PM

## 2015-06-28 NOTE — Progress Notes (Signed)
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 The patient also has vasculitis and is on chronic immunosuppression including mycophenolate twice a day and prednisone 4 mg daily.  The past 3-6 weeks she has had dry cough, left pleuritic chest pain, poor appetite and weight loss. No fever. No shortness of breath. 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 No prior history of syncope, arrhythmia, or stroke. State she states she had been probably dehydrated.  The patient is admitted to the hospital by the hospital service for evaluation of her syncope. The patient has been seen in the past by Dr. Melvyn Novas for low enlarging loculated left pleural effusion. In late April a ultrasound directed thoracentesis removed 1 cc of bloody fluid which was culture negative, cytology negative. The patient was referred to Dr. Roxan Hockey for left VATS and drainage of loculated enlarging pleural effusion-empyema.  The patient denies any recent trauma to her chest.  CT scan shows a large lateral left pleural effusion with enhanced border. No evidence of poor emboli. No evidence of pericardial effusion. Echocardiogram performed today shows normal biventricular function, no valvular insufficiency, no pericardial effusion.  Objective: Vital signs in last 24 hours: Temp:  [97.6 F (36.4 C)-98.6 F (37 C)] 98.6 F (37 C) (05/15 1200) Pulse Rate:  [89-109] 96 (05/15 1200) Cardiac Rhythm:  [-] Normal sinus rhythm (05/15 0836) Resp:  [16-26] 18 (05/15 1200) BP: (113-164)/(78-96) 120/78 mmHg (05/15 1200) SpO2:  [100 %] 100 % (05/15 1200) Weight:  [149 lb 1.6 oz (67.631 kg)-151 lb (68.493 kg)] 149 lb 1.6 oz  (67.631 kg) (05/15 0835)  Hemodynamic parameters for last 24 hours:    Intake/Output from previous day:   Intake/Output this shift:        Physical Exam  General: Middle-aged AA female, normal body habitus no distress HEENT: Normocephalic pupils equal , dentition adequate Neck: Supple without JVD, adenopathy, or bruit Chest: Diminished breath sounds on left, no rhonchi, no tenderness             or deformity Cardiovascular: Regular rate and rhythm, no murmur, no gallop, peripheral pulses             palpable in all extremities Abdomen:  Soft, nontender, no palpable mass or organomegaly Extremities: Warm, well-perfused, no clubbing cyanosis edema or tenderness,              no venous stasis changes of the legs Rectal/GU: Deferred Neuro: Grossly non--focal and symmetrical throughout Skin: Clean and dry without rash or ulceration   Lab Results:  Recent Labs  06/28/15 0426  WBC 10.3  HGB 13.6  HCT 42.4  PLT 182   BMET:  Recent Labs  06/28/15 0426  NA 138  K 3.1*  CL 103  CO2 24  GLUCOSE 91  BUN 11  CREATININE 0.94  CALCIUM 9.2    PT/INR:  Recent Labs  06/28/15 1028  LABPROT 14.9  INR 1.15   ABG    Component Value Date/Time   PHART 7.461* 10/14/2012 1300   HCO3 23.8 10/14/2012 1300   TCO2 28 03/03/2013 1930   O2SAT 96.6 10/14/2012 1300   CBG (last 3)   Recent Labs  06/28/15 0405  GLUCAP 82    Assessment/Plan: S/P Procedure(s) (  LRB): VIDEO ASSISTED THORACOSCOPY (Left) DRAINAGE OF PLEURAL EFFUSION (Left)  Loculated left pleural effusion, large Patient will be scheduled for left VATS by Dr. Roxan Hockey 72 hours. Patient should remain hospitalized until surgery a.m. May 18. I discussed the procedure left VATS and the reasons for the surgery as well as the expected postoperative recovery and potential problems and she demonstrates understanding and agrees to proceed.      Tharon Aquas Trigt III 06/28/2015

## 2015-06-28 NOTE — H&P (Addendum)
History and Physical    Jamie [name] [id number] DOB: [birth year] DOA: 06/28/2015  Referring MD/NP/PA: Dr Leonides Schanz -= [facility] PCP: Walker Kehr, MD Patient coming from: Work - [employer] Employee  Chief Complaint: Syncope  HPI: Jamie [name] is a 50 y.o. female with medical history significant of HTN, thyroid dysfunction, rheumatoid vasculitis/arthritis presenting for evaluation after syncopal episode. 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 Patient works at [employer] to 3 PM shift. She presented today to work in her normal state of health and was checking in patients when she developed sudden onset chest discomfort, shortness breath, dizziness. This lasted only a matter of seconds before patient was advised and was waking up on the floor. Patient was in the process of checking in another patient when this occurred. Per report of witnessed patient did not check her head area the loss of bowel or bladder function or reported seizure-like activity. Patient denies any recent worsening shortness of breath, fevers, nausea, vomiting, diarrhea, constipation, dysuria, back pain. Reported by patient and her husband she has lost approximately 12 pounds over the last 5-1/2 months due to in part to poor appetite. She reports mild chest discomfort which has persisted since the episode which is Center chest to left chest. Denies palpitations or radiation of the pain to her neck or arm. Of note patient has been worked up in the outpatient setting by Dr. work pulmonology for a persistent pleural effusion. Patient was scheduled to have thoracentesis done by Cardiothoracic surgery later this week.   ED Course: Chemistries show potassium 3.1 elevated d-dimer and CT scan without evidence of pulmonary embolus but evidence of persistent loculated left pleural effusion. With the statics performed with elevation in patient's heart rate but no significant change in blood pressure the patient did  endorse feeling symptomatic during the test. 1 L normal saline bolus and potassium given.  Review of Systems: As per HPI otherwise 10 point review of systems negative.   Past Medical History  Diagnosis Date  . Hypertension   . Multiple thyroid nodules   . History of cold sores   . 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 Photosensitivity 04/17/2012    3/14 new   . Pulmonary nodules/lesions, multiple 10/13/2012  . Pneumomediastinum ([place])     Pneumomediastinum in the setting of progressive nodular pulmonary infiltrates with dermatologic vasculitis/notes 12/10/2012  . Dysrhythmia     ST/notes 12/10/2012  . Pneumonia 02/2012    "once" (12/10/2012)  . 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 Rheumatoid arthritis ([place])     "legs; arms" (12/10/2012)  . Pneumomediastinum ([place]) 03/03/2013    Past Surgical History  Procedure Laterality Date  . Video bronchoscopy Left 10/18/2012    Procedure: VIDEO BRONCHOSCOPY;  Surgeon: Rexene Alberts, MD;  Location: [place];  Service: Thoracic;  Laterality: Left;  . Video assisted thoracoscopy Left 10/18/2012    Procedure: VIDEO ASSISTED THORACOSCOPY;  Surgeon: Rexene Alberts, MD;  Location: [place] [facility];  Service: Thoracic;  Laterality: Left;  . Lung biopsy Left 10/18/2012    Procedure: LUNG BIOPSY;  Surgeon: Rexene Alberts, MD;  Location: [place];  Service: Thoracic;  Laterality: Left;  . Abdominal hysterectomy  ~ 2006     reports that she has never smoked. She has never used smokeless tobacco. She reports that she does not drink alcohol or use illicit drugs.  Allergies  Allergen Reactions  .  Benazepril Hcl     REACTION: cough  . Tramadol     n/v  . Ciprofloxacin Rash    Joint pain REACTION: Rhabdomyolysis    Family History  Problem Relation Age of Onset  . Hypertension Other   . Hypertension Mother   . Cancer Father 68    prostate ca    Prior to Admission medications   Medication Sig Start Date End Date  Taking? Authorizing Provider  amLODipine (NORVASC) 10 MG tablet Take 1 tablet by mouth daily. 03/18/15  Yes Historical Provider, MD  famotidine (PEPCID) 20 MG tablet One at bedtime Patient taking differently: Take 20 mg by mouth daily as needed for heartburn. One at bedtime 06/04/15  Yes Tanda Rockers, MD  hydroxychloroquine (PLAQUENIL) 200 MG tablet Take 300-400 mg by mouth every other day. Alternating between 300mg  and 400mg  daily. 07/23/14  Yes Historical Provider, MD  mycophenolate (CELLCEPT) 500 MG tablet Take 2 tablets by mouth 2 (two) times daily. 08/26/14  Yes Historical Provider, MD  pantoprazole (PROTONIX) 40 MG tablet Take 1 tablet (40 mg total) by mouth daily. Take 30-60 min before first meal of the day Patient taking differently: Take 40 mg by mouth daily as needed (heartburn).  06/04/15  Yes Tanda Rockers, MD  potassium chloride SA (K-DUR,KLOR-CON) 20 MEQ tablet Take 1 tablet (20 mEq total) by mouth daily. 06/04/15  Yes Aleksei Plotnikov V, MD  predniSONE (DELTASONE) 1 MG tablet Take 4 mg by mouth daily with breakfast.   Yes Historical Provider, MD  promethazine-codeine (PHENERGAN WITH CODEINE) 6.25-10 MG/5ML syrup Take 5 mLs by mouth every 6 (six) hours as needed for cough. 06/03/15  Yes Cassandria Anger, MD    Physical Exam: Filed Vitals:   06/28/15 0730 06/28/15 0740 06/28/15 0745 06/28/15 0835  BP: 136/95 153/92 164/96 142/85  Pulse: 101 109 104 99  Temp:    98.5 F (36.9 C)  TempSrc:    Oral  Resp:  16  18  Height:      Weight:    67.631 kg (149 lb 1.6 oz)  SpO2: 100% 100% 100% 100%      Constitutional: NAD, calm, comfortable Filed Vitals:   06/28/15 0730 06/28/15 0740 06/28/15 0745 06/28/15 0835  BP: 136/95 153/92 164/96 142/85  Pulse: 101 109 104 99  Temp:    98.5 F (36.9 C)  TempSrc:    Oral  Resp:  16  18  Height:      Weight:    67.631 kg (149 lb 1.6 oz)  SpO2: 100% 100% 100% 100%   Eyes:  PERRL, lids and conjunctivae normal ENMT:  Mucous membranes are  moist. Posterior pharynx clear of any exudate or lesions.  Neck:  normal, supple, no masses, no thyromegaly Respiratory: Decreased breath sounds in left lateral superior and inferior lung fields. Normal effort. Occasional cough..  Cardiovascular:  Regular rate and rhythm, no murmurs / rubs / gallops. No extremity edema. 2+ pedal pulses. No carotid bruits.  Abdomen:  no tenderness, no masses palpated. No hepatosplenomegaly. Bowel sounds positive.  Musculoskeletal:  no clubbing / cyanosis. No joint deformity upper and lower extremities. Good ROM, no contractures. Normal muscle tone.  Skin:  no rashes, lesions, ulcers. No induration Neurologic:  CN 2-12 grossly intact. Sensation intact, Strength 5/5 in all 4.  Psychiatric:  Normal judgment and insight. Alert and oriented x 3. Normal mood.    Labs on Admission: I have personally reviewed following labs and imaging studies  CBC:  Recent  Labs Lab 06/28/15 0426  WBC 10.3  HGB 13.6  HCT 42.4  MCV 88.9  PLT [id number]   Basic Metabolic Panel:  Recent Labs Lab 06/28/15 0426  NA 138  K 3.1*  CL 103  CO2 24  GLUCOSE 91  BUN 11  CREATININE 0.94  CALCIUM 9.2  MG 2.1   GFR: Estimated Creatinine Clearance: 68.9 mL/min (by C-G formula based on Cr of 0.94). Liver Function Tests: No results for input(s): AST, ALT, ALKPHOS, BILITOT, PROT, ALBUMIN in the last 168 hours. No results for input(s): LIPASE, AMYLASE in the last 168 hours. No results for input(s): AMMONIA in the last 168 hours. Coagulation Profile: No results for input(s): INR, PROTIME in the last 168 hours. Cardiac Enzymes: No results for input(s): CKTOTAL, CKMB, CKMBINDEX, TROPONINI in the last 168 hours. BNP (last 3 results) No results for input(s): PROBNP in the last 8760 hours. HbA1C: No results for input(s): HGBA1C in the last 72 hours. CBG:  Recent Labs Lab 06/28/15 0405  GLUCAP 82   Lipid Profile: No results for input(s): CHOL, HDL, LDLCALC, TRIG, CHOLHDL, LDLDIRECT  in the last 72 hours. Thyroid Function Tests: No results for input(s): TSH, T4TOTAL, FREET4, T3FREE, THYROIDAB in the last 72 hours. Anemia Panel: No results for input(s): VITAMINB12, FOLATE, FERRITIN, TIBC, IRON, RETICCTPCT in the last 72 hours. Urine analysis:    Component Value Date/Time   COLORURINE YELLOW 10/18/2012 0548   APPEARANCEUR CLOUDY* 10/18/2012 0548   LABSPEC 1.027 10/18/2012 0548   PHURINE 6.0 10/18/2012 0548   GLUCOSEU NEGATIVE 10/18/2012 0548   GLUCOSEU NEGATIVE 10/27/2010 0734   HGBUR NEGATIVE 10/18/2012 0548   BILIRUBINUR NEGATIVE 10/18/2012 0548   KETONESUR NEGATIVE 10/18/2012 0548   PROTEINUR NEGATIVE 10/18/2012 0548   UROBILINOGEN 1.0 10/18/2012 0548   NITRITE NEGATIVE 10/18/2012 0548   LEUKOCYTESUR NEGATIVE 10/18/2012 0548    Creatinine Clearance: Estimated Creatinine Clearance: 68.9 mL/min (by C-G formula based on Cr of 0.94).  Sepsis Labs: @LABRCNTIP (procalcitonin:4,lacticidven:4) )No results found for this or any previous visit (from the past 240 hour(s)).   Radiological Exams on Admission: Dg Chest 2 View  06/28/2015  CLINICAL DATA:  Chest pain, shortness of breath, and syncope. EXAM: CHEST  2 VIEW COMPARISON:  06/11/2015 FINDINGS: Normal heart size and pulmonary vascularity. Emphysematous changes and scattered fibrosis in the lungs. Peripheral opacities in the upper lungs bilaterally and in the right lower lung correspond to peripheral lesions seen on previous CT, some with cavitation. No progression since previous study. There is a left pleural opacity likely representing a loculated fluid along the left chest wall. Atelectasis in the left lung base. No significant change since previous study. No pneumothorax. Mediastinal contours appear intact. IMPRESSION: Unchanged appearance of the chest since previous study. Again demonstrated are emphysematous changes with scattered fibrosis, peripheral opacities, and loculated fluid collection in the left lateral  chest. Electronically Signed   By: Lucienne Capers M.D.   On: 06/28/2015 04:59   Ct Angio Chest Pe W/cm &/or Wo Cm  06/28/2015  CLINICAL DATA:  Chest pain and shortness of breath. History of autoimmune/rheumatoid vasculitis EXAM: CT ANGIOGRAPHY CHEST WITH CONTRAST TECHNIQUE: Multidetector CT imaging of the chest was performed using the standard protocol during bolus administration of intravenous contrast. Multiplanar CT image reconstructions and MIPs were obtained to evaluate the vascular anatomy. CONTRAST:  100 mL Isovue 370 nonionic COMPARISON:  Chest CT June 08, 2015; chest radiograph Jun 28, 2015 FINDINGS: Mediastinum/Lymph Nodes: There is no demonstrable pulmonary embolus. There is no thoracic aortic  aneurysm or dissection. The visualized great vessels appear unremarkable. Pericardium is not appreciably thickened. There is left ventricular hypertrophy. Visualized thyroid appears normal. Multiple small lymph nodes are stable. Largest individual lymph node is noted to the left of the aortic arch anteriorly measuring 1.3 x 1.0 cm. Lungs/Pleura: There is a moderate loculated pleural fluid collection on the left which appears stable. Mild pleural enhancement at the level of this loculated fluid collection remains stable. There is consolidation adjacent to this pleural fluid in the left lower lobe region which is stable. There are patchy areas of atelectasis bilaterally which are stable. There is mild cavitation in an area of consolidation in the posterior segment of the right upper lobe which is stable. A smaller area of cavitation along the periphery of the anterior segment of the right upper lobe is also stable. There is no new parenchymal lung opacity compared to recent prior study. There is mild upper and lower lobe bronchiectatic change bilaterally, stable. Upper abdomen: There are multiple hepatic cysts which appears stable. Small splenules on the left are stable. Visualized upper abdominal structures are  unchanged and otherwise unremarkable. Musculoskeletal: There are no blastic or lytic bone lesions. Review of the MIP images confirms the above findings. IMPRESSION: No demonstrable pulmonary embolus. Stable loculated effusion on the left with enhancement of the adjacent pleura. There is persistent adjacent left lower lobe consolidation. The pleural enhancement may be secondary to either infection or reaction to the autoimmune vasculitis. Scattered areas of atelectasis are stable. Areas of cavitation in the periphery of the anterior posterior segments of the right upper lobe remains, likely sequela of the autoimmune vasculitis/rheumatoid lung disease. No new opacity evident. Mild bronchiectatic change stable. Borderline prominent lymph nodes adjacent to the aortic arch on the left. No new lymph node prominence. Electronically Signed   By: Lowella Grip III M.D.   On: 06/28/2015 07:09    Assessment/Plan Principal Problem:   Syncope Active Problems:   Essential hypertension   Rheumatoid vasculitis (HCC)   Pulmonary nodules/lesions, multiple   Pleural effusion, left   GERD (gastroesophageal reflux disease)   Pain in the chest   Chest pain   Syncope: Suspect multifactorial including mild dehydration, poor recent nutritional intake, and vagal type episode. Orthostatics equivocal. Patient with baseline compromise respiratory status due to rheumatoid involvement of the lung. Persistent left pleural effusion. No evidence of arrhythmia, seizure, significant metabolic derangement, infectious or intracranial process causing patient's symptoms. - Tele - f/u UA - NS 114ml/hr - Echo  CP: likely from presistant pleural effusion. Pt w/ baseline CP but this is worse than normal. EKG w/o ACS - cycle trop - EKG in am - pleural effusion treatment as below.   Loculated Pleural effusion: Pt followed by Dr Melvyn Novas of pulmonology as outpt. Dr. Melvyn Novas had referred pt to Dr Gorden Harms of  Cardiothoracic surgery for  thoracentecis. Her last pulmonary note this is been thought to be due to patient's advanced rheumatoid disease and not an infectious or malignant process. I have consulted Dr. Prescott Gum of CTS who has graciously agreed to see the pt in consultation during admission.  - management/Dx per CTS.   Rheumatoid Arthritis/vasculitis - Contineu prednisone 4mg , plaquenil, cellcept  GERD: - continue ppi PRN  HTN: - continue norvasc   DVT prophylaxis: SCD  Code Status: FULL  Family Communication: husband  Disposition Plan: pending workup and possible [place] per CTS  Consults called: Dr. Prescott Gum of Cardiothoracic surgery  Admission status: Obs.  Kase Shughart J MD [facility]  If 7PM-7AM, please contact night-coverage [email or web address] Password [facility]  06/28/2015, 9:46 AM

## 2015-06-28 NOTE — ED Notes (Signed)
Pt got out of another patient's room after registered and passed out on the hallway, states she has a 3/10 mid cp and sob, denies any nausea or vomiting at this time.

## 2015-06-29 DIAGNOSIS — R55 Syncope and collapse: Secondary | ICD-10-CM | POA: Diagnosis not present

## 2015-06-29 DIAGNOSIS — I10 Essential (primary) hypertension: Secondary | ICD-10-CM | POA: Diagnosis not present

## 2015-06-29 DIAGNOSIS — M0520 Rheumatoid vasculitis with rheumatoid arthritis of unspecified site: Secondary | ICD-10-CM | POA: Diagnosis not present

## 2015-06-29 DIAGNOSIS — K219 Gastro-esophageal reflux disease without esophagitis: Secondary | ICD-10-CM | POA: Diagnosis not present

## 2015-06-29 LAB — COMPREHENSIVE METABOLIC PANEL
ALT: 13 U/L — AB (ref 14–54)
ANION GAP: 12 (ref 5–15)
AST: 17 U/L (ref 15–41)
Albumin: 3.2 g/dL — ABNORMAL LOW (ref 3.5–5.0)
Alkaline Phosphatase: 98 U/L (ref 38–126)
BUN: 6 mg/dL (ref 6–20)
CHLORIDE: 103 mmol/L (ref 101–111)
CO2: 24 mmol/L (ref 22–32)
CREATININE: 0.83 mg/dL (ref 0.44–1.00)
Calcium: 9.2 mg/dL (ref 8.9–10.3)
Glucose, Bld: 88 mg/dL (ref 65–99)
POTASSIUM: 3.4 mmol/L — AB (ref 3.5–5.1)
SODIUM: 139 mmol/L (ref 135–145)
Total Bilirubin: 0.5 mg/dL (ref 0.3–1.2)
Total Protein: 7.2 g/dL (ref 6.5–8.1)

## 2015-06-29 LAB — ECHOCARDIOGRAM COMPLETE
Height: 67 in
WEIGHTICAEL: 2385.6 [oz_av]

## 2015-06-29 LAB — CBC
HCT: 39.2 % (ref 36.0–46.0)
Hemoglobin: 12.4 g/dL (ref 12.0–15.0)
MCH: 28.2 pg (ref 26.0–34.0)
MCHC: 31.6 g/dL (ref 30.0–36.0)
MCV: 89.1 fL (ref 78.0–100.0)
PLATELETS: 270 10*3/uL (ref 150–400)
RBC: 4.4 MIL/uL (ref 3.87–5.11)
RDW: 13.6 % (ref 11.5–15.5)
WBC: 8.6 10*3/uL (ref 4.0–10.5)

## 2015-06-29 LAB — GLUCOSE, CAPILLARY: GLUCOSE-CAPILLARY: 93 mg/dL (ref 65–99)

## 2015-06-29 NOTE — Progress Notes (Signed)
PROGRESS NOTE                                                                                                                                                                                                             Patient Demographics:    Jamie [name], is a 50 y.o. female, DOB - 03/18/1965, [id number]  Admit date - 06/28/2015   Admitting Physician Waldemar Dickens, MD  Outpatient Primary MD for the patient is Walker Kehr, MD   Chief Complaint  Patient presents with  . Loss of Consciousness  . Chest Pain  . 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 The patient also has vasculitis and is on chronic immunosuppression including mycophenolate twice a day and prednisone 4 mg daily.  The past 3-6 weeks she has had dry cough, left pleuritic chest pain, poor appetite and weight loss. No fever. No shortness of breath. 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 No prior history of syncope, arrhythmia, or stroke. State she states she had been probably dehydrated.  Admitted to our service for evaluation of syncope.   Subjective:    Jamie [name] Has no new complaints. No acute issues/problems reported.   Assessment  & Plan :    Principal Problem:   Syncope - multifactorial dehydration, poor recent nutritional intake and vagal type suspected - Will obtain orthostatic vital signs - echocardiogram pending.    Pleural effusion, left - Cardiothoracic surgeon consulted and plan is for operataion 07/01/15 (left VATS) - Based on history: In late April a ultrasound directed thoracentesis removed 1 cc of bloody fluid which was culture negative, cytology negative. The patient was referred to Dr. Roxan Hockey for left VATS and drainage of loculated  enlarging pleural effusion-empyema.  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    Pain in the chest - Most likely due to pleural effusion - treat supportively  Code Status : full  Family Communication  : d/c patient directly  Disposition Plan  : VATs 07/01/15  Consults  :  CVTS  Procedures  : pending  DVT  Prophylaxis  :  SCDs   Lab Results  Component Value Date   PLT 270 06/29/2015    Antibiotics  :  Plaquenil  Anti-infectives    Start     Dose/Rate Route Frequency Ordered Stop   06/29/15 1000  hydroxychloroquine (PLAQUENIL) tablet 300 mg     300 mg Oral Every other day 06/28/15 0949     06/28/15 1000  hydroxychloroquine (PLAQUENIL) tablet 300-400 mg  Status:  Discontinued     300-400 mg Oral Every other day 06/28/15 0942 06/28/15 0948   06/28/15 1000  hydroxychloroquine (PLAQUENIL) tablet 400 mg     400 mg Oral Every other day 06/28/15 0949          Objective:   Filed Vitals:   06/28/15 2117 06/29/15 0516 06/29/15 0955 06/29/15 1212  BP: 139/78 130/88 136/90 128/83  Pulse: 88 85 94 100  Temp: 97.7 F (36.5 C) 98.3 F (36.8 C) 98.1 F (36.7 C) 98.7 F (37.1 C)  TempSrc: Oral Oral Oral Oral  Resp: 18 18 18 17   Height:      Weight:  67.858 kg (149 lb 9.6 oz)    SpO2: 100% 100% 100% 100%    Wt Readings from Last 3 Encounters:  06/29/15 67.858 kg (149 lb 9.6 oz)  06/04/15 68.947 kg (152 lb)  04/08/15 74.39 kg (164 lb)     Intake/Output Summary (Last 24 hours) at 06/29/15 1307 Last data filed at 06/29/15 1212  Gross per 24 hour  Intake    720 ml  Output    300 ml  Net    420 ml     Physical Exam  Awake Alert, Oriented X 3, No new F.N deficits, Normal affect [place].AT,PERRAL Supple Neck,No JVD, No cervical lymphadenopathy appreciated.  Symmetrical Chest wall movement, Good air movement bilaterally, decreased breath sounds over left lung base RRR,No Gallops,Rubs or  new Murmurs, No Parasternal Heave +ve B.Sounds, Abd Soft, No tenderness, No organomegaly appreciated, No rebound - guarding or rigidity. No Cyanosis, Clubbing or edema, No new Rash or bruise      Data Review:    CBC  Recent Labs Lab 06/28/15 0426 06/29/15 0446  WBC 10.3 8.6  HGB 13.6 12.4  HCT 42.4 39.2  PLT 182 270  MCV 88.9 89.1  MCH 28.5 28.2  MCHC 32.1 31.6  RDW 13.6 13.6    Chemistries   Recent Labs Lab 06/28/15 0426 06/29/15 0446  NA 138 139  K 3.1* 3.4*  CL 103 103  CO2 24 24  GLUCOSE 91 88  BUN 11 6  CREATININE 0.94 0.83  CALCIUM 9.2 9.2  MG 2.1  --   AST  --  17  ALT  --  13*  ALKPHOS  --  98  BILITOT  --  0.5   ------------------------------------------------------------------------------------------------------------------ No results for input(s): CHOL, HDL, LDLCALC, TRIG, CHOLHDL, LDLDIRECT in the last 72 hours.  Lab Results  Component Value Date   HGBA1C 5.9* 10/14/2012   ------------------------------------------------------------------------------------------------------------------  Recent Labs  06/28/15 1028  TSH 0.659   ------------------------------------------------------------------------------------------------------------------ No results for input(s): VITAMINB12, FOLATE, FERRITIN, TIBC, IRON, RETICCTPCT in the last 72 hours.  Coagulation profile  Recent Labs Lab 06/28/15 1028  INR 1.15     Recent Labs  06/28/15 0426  DDIMER 3.21*    Cardiac Enzymes  Recent Labs Lab 06/28/15 1028 06/28/15 1252 06/28/15 1546  TROPONINI <0.03 <0.03 <0.03   ------------------------------------------------------------------------------------------------------------------ No results found for: BNP  Inpatient Medications  Scheduled Meds: . amLODipine  10 mg Oral Daily  . hydroxychloroquine  400 mg Oral QODAY   And  . hydroxychloroquine  300 mg Oral QODAY  . mycophenolate  1,000 mg Oral BID  . potassium chloride SA  20 mEq  Oral Daily  . predniSONE  4 mg Oral Q breakfast  . sodium chloride flush  3 mL Intravenous Q12H  . sodium chloride flush  3 mL Intravenous Q12H   Continuous Infusions: . sodium chloride    . sodium chloride 100 mL/hr at 06/28/15 2028   PRN Meds:.acetaminophen **OR** acetaminophen, gi cocktail, ondansetron **OR** ondansetron (ZOFRAN) IV, oxyCODONE, pantoprazole, promethazine-codeine  Micro Results No results found for this or any previous visit (from the past 240 hour(s)).  Radiology Reports Dg Chest 1 View  06/11/2015  CLINICAL DATA:  Post left thoracentesis for pleural effusion on left side. Cough today. EXAM: CHEST 1 VIEW COMPARISON:  Chest x-ray dated 06/03/2015 and chest CT dated 06/08/2015. FINDINGS: The pleural opacity on the left is not significantly changed in size or extent compared to the chest x-ray of 06/03/2015. Scattered small peripheral opacities throughout both lungs appear are stable as well, compatible with the chronic findings described on earlier chest CT described as likely related to rheumatoid lung disease. No new lung findings. Cardiomediastinal silhouette is stable in size and configuration. No pneumothorax seen. IMPRESSION: Stable chest x-ray. No change in the size or extent of the left-sided pleural based opacity. No pneumothorax. Electronically Signed   By: Franki Cabot M.D.   On: 06/11/2015 13:50   Dg Chest 2 View  06/28/2015  CLINICAL DATA:  Chest pain, shortness of breath, and syncope. EXAM: CHEST  2 VIEW COMPARISON:  06/11/2015 FINDINGS: Normal heart size and pulmonary vascularity. Emphysematous changes and scattered fibrosis in the lungs. Peripheral opacities in the upper lungs bilaterally and in the right lower lung correspond to peripheral lesions seen on previous CT, some with cavitation. No progression since previous study. There is a left pleural opacity likely representing a loculated fluid along the left chest wall. Atelectasis in the left lung base. No  significant change since previous study. No pneumothorax. Mediastinal contours appear intact. IMPRESSION: Unchanged appearance of the chest since previous study. Again demonstrated are emphysematous changes with scattered fibrosis, peripheral opacities, and loculated fluid collection in the left lateral chest. Electronically Signed   By: Lucienne Capers M.D.   On: 06/28/2015 04:59   Dg Chest 2 View  06/03/2015  CLINICAL DATA:  Cough for 2 weeks.  History of pneumonia. EXAM: CHEST  2 VIEW COMPARISON:  03/09/2013. FINDINGS: The cardiac silhouette, mediastinal and hilar contours are within normal limits and stable. Chronic scarring changes bilaterally. There is a loculated left pleural fluid collection. Could not exclude empyema. Chest CT with contrast may be helpful for further evaluation. Stable surgical changes at the left lung apex. The bony thorax is intact. IMPRESSION: Moderate-sized loculated pleural fluid collection on the left. Could not exclude empyema. Recommend chest CT with contrast for further evaluation. Chronic lung disease with scarring changes. Electronically Signed   By: Marijo Sanes M.D.   On: 06/03/2015 13:43   Ct Chest W Contrast  06/08/2015  CLINICAL DATA:  50 year old presenting with 2 week history of nonproductive cough. Patient on chronic prednisone therapy for rheumatoid arthritis and vasculitis. Chest x-ray 5 days ago demonstrated a loculated left pleural effusion. EXAM: CT CHEST WITH CONTRAST TECHNIQUE: Multidetector CT imaging of the chest was performed during intravenous contrast administration. CONTRAST:  11mL ISOVUE-300 IOPAMIDOL 61% IV. COMPARISON:  CTA chest 03/03/2013. FINDINGS: Cardiovascular: Heart size normal. No visible coronary atherosclerosis. No significant pericardial effusion. No visible atherosclerosis involving the thoracic or upper abdominal aorta or their visualized branches. Mediastinum/Lymph Nodes: Upper normal sized preaortic lymph nodes, increased in size  since the prior examination, the largest measuring approximately 12 x 9 mm. No pathologic lymphadenopathy elsewhere. Normal sized thyroid gland containing several subcentimeter nodules. Normal-appearing esophagus. Lungs/Pleura: Loculated pleural fluid laterally and inferiorly in the left hemithorax with enhancement of the parietal and visceral pleura. Complex fluid within the pleural space with Hounsfield measurements approximating 30. Consolidation involving the left lower lobe. Peripheral opacities in both lungs, some of which are associated with focal bronchiectasis and central cavitation, much improved when compared to the prior examination. No new parenchymal abnormalities in either lung. No right pleural effusion. Central airways patent without significant bronchial wall thickening. Upper abdomen: Numerous hepatic cysts as noted previously. 3 foci of accessory splenic tissue medial to the spleen below the hilum. No acute or significant abnormalities involving the visualized upper abdomen. Musculoskeletal: Possible mild generalized osseous demineralization. No acute abnormalities. IMPRESSION: 1. Loculated complex pleural fluid collection involving the lateral and inferior left hemithorax with enhancement of the visceral and parietal pleura surrounding the collection. Empyema versus loculated effusion related to acute inflammation of the pleura related to the patient's autoimmune disease. 2. Consolidation in the left lower lobe likely represents passive atelectasis related to the effusion. 3. Peripheral opacities throughout both lungs some of which are associated with focal bronchiectasis and central cavitation, an appearance that is improved when compared to the prior CT from January, 2015, likely rheumatoid lung disease. 4. Possible mild generalized osseous demineralization. As the patient is on chronic prednisone therapy, one might consider DXA evaluation to determine if bone building therapy is indicated.  Electronically Signed   By: Evangeline Dakin M.D.   On: 06/08/2015 15:37   Ct Angio Chest Pe W/cm &/or Wo Cm  06/28/2015  CLINICAL DATA:  Chest pain and shortness of breath. History of autoimmune/rheumatoid vasculitis EXAM: CT ANGIOGRAPHY CHEST WITH CONTRAST TECHNIQUE: Multidetector CT imaging of the chest was performed using the standard protocol during bolus administration of intravenous contrast. Multiplanar CT image reconstructions and MIPs were obtained to evaluate the vascular anatomy. CONTRAST:  100 mL Isovue 370 nonionic COMPARISON:  Chest CT June 08, 2015; chest radiograph Jun 28, 2015 FINDINGS: Mediastinum/Lymph Nodes: There is no demonstrable pulmonary embolus. There is no thoracic aortic aneurysm or dissection. The visualized great vessels appear unremarkable. Pericardium is not appreciably thickened. There is left ventricular hypertrophy. Visualized thyroid appears normal. Multiple small lymph nodes are stable. Largest individual lymph node is noted to the left of the aortic arch anteriorly measuring 1.3 x 1.0 cm. Lungs/Pleura: There is a moderate loculated pleural fluid collection on the left which appears stable. Mild pleural enhancement at the level of this loculated fluid collection remains stable. There is consolidation adjacent to this pleural fluid in the left lower lobe region which is stable. There are patchy areas of atelectasis bilaterally which are stable. There is mild cavitation in an area of consolidation in the posterior segment of the right upper lobe which is stable. A smaller area of cavitation along the periphery of the anterior segment of the right upper lobe is also stable. There is no new parenchymal lung opacity compared to recent prior study. There is mild upper and lower lobe bronchiectatic change bilaterally, stable. Upper abdomen: There are multiple hepatic cysts which appears stable. Small splenules on the left  are stable. Visualized upper abdominal structures are  unchanged and otherwise unremarkable. Musculoskeletal: There are no blastic or lytic bone lesions. Review of the MIP images confirms the above findings. IMPRESSION: No demonstrable pulmonary embolus. Stable loculated effusion on the left with enhancement of the adjacent pleura. There is persistent adjacent left lower lobe consolidation. The pleural enhancement may be secondary to either infection or reaction to the autoimmune vasculitis. Scattered areas of atelectasis are stable. Areas of cavitation in the periphery of the anterior posterior segments of the right upper lobe remains, likely sequela of the autoimmune vasculitis/rheumatoid lung disease. No new opacity evident. Mild bronchiectatic change stable. Borderline prominent lymph nodes adjacent to the aortic arch on the left. No new lymph node prominence. Electronically Signed   By: Lowella Grip III M.D.   On: 06/28/2015 07:09   US Thoracentesis Asp Pleural Space W/img Guide  06/11/2015  INDICATION: Loculated left pleural effusion seen on previous imaging. Request diagnostic and therapeutic thoracentesis. EXAM: ULTRASOUND GUIDED LEFT THORACENTESIS MEDICATIONS: None. COMPLICATIONS: None immediate. PROCEDURE: An ultrasound guided thoracentesis was thoroughly discussed with the patient and questions answered. The benefits, risks, alternatives and complications were also discussed. The patient understands and wishes to proceed with the procedure. Written consent was obtained. Ultrasound of the left chest reveals severely/densely loculated posterior lateral effusion. A site was chosen for attempt at ultrasound-guided thoracentesis. The area was then prepped and draped in the normal sterile fashion. 1% Lidocaine was used for local anesthesia. Under ultrasound guidance a 7 cm common 19 gauge, Yueh catheter was introduced without difficulty through the pleural rind. Ultimately only 1 mL of dark bloody fluid could be aspirated. The catheter was removed and a  dressing applied. FINDINGS: A total of approximately 1 mL of dark bloody fluid was removed. Samples were sent to the laboratory as requested by the clinical team. IMPRESSION: Successful ultrasound guided left thoracentesis yielding 1 mL of pleural fluid. Read by: Ascencion Dike PA-C Electronically Signed   By: Marybelle Killings M.D.   On: 06/11/2015 13:44    Time Spent in minutes  25   Velvet Bathe M.D on 06/29/2015 at 1:07 PM  Between 7am to 7pm - Pager - [phone]  After 7pm go to [email or web address] - password [facility]  [facility] -  Office  [phone]

## 2015-06-30 ENCOUNTER — Encounter: Payer: Commercial Managed Care - HMO | Admitting: Thoracic Surgery (Cardiothoracic Vascular Surgery)

## 2015-06-30 DIAGNOSIS — J948 Other specified pleural conditions: Secondary | ICD-10-CM | POA: Diagnosis not present

## 2015-06-30 DIAGNOSIS — I313 Pericardial effusion (noninflammatory): Secondary | ICD-10-CM | POA: Diagnosis not present

## 2015-06-30 DIAGNOSIS — I10 Essential (primary) hypertension: Secondary | ICD-10-CM | POA: Diagnosis not present

## 2015-06-30 DIAGNOSIS — M0520 Rheumatoid vasculitis with rheumatoid arthritis of unspecified site: Secondary | ICD-10-CM

## 2015-06-30 DIAGNOSIS — R55 Syncope and collapse: Secondary | ICD-10-CM | POA: Diagnosis not present

## 2015-06-30 LAB — COMPREHENSIVE METABOLIC PANEL
ALBUMIN: 3.5 g/dL (ref 3.5–5.0)
ALK PHOS: 108 U/L (ref 38–126)
ALT: 12 U/L — ABNORMAL LOW (ref 14–54)
ANION GAP: 11 (ref 5–15)
AST: 19 U/L (ref 15–41)
BILIRUBIN TOTAL: 0.5 mg/dL (ref 0.3–1.2)
BUN: 12 mg/dL (ref 6–20)
CALCIUM: 9.2 mg/dL (ref 8.9–10.3)
CO2: 23 mmol/L (ref 22–32)
Chloride: 102 mmol/L (ref 101–111)
Creatinine, Ser: 0.76 mg/dL (ref 0.44–1.00)
GFR calc non Af Amer: >60 mL/min (ref >60)
Glucose, Bld: 170 mg/dL — ABNORMAL HIGH (ref 65–99)
POTASSIUM: 3.5 mmol/L (ref 3.5–5.1)
SODIUM: 136 mmol/L (ref 135–145)
TOTAL PROTEIN: 7.8 g/dL (ref 6.5–8.1)

## 2015-06-30 LAB — TYPE AND SCREEN
ABO/RH(D): O POS
Antibody Screen: NEGATIVE

## 2015-06-30 LAB — CBC
HEMATOCRIT: 41.2 % (ref 36.0–46.0)
HEMOGLOBIN: 13.7 g/dL (ref 12.0–15.0)
MCH: 29.3 pg (ref 26.0–34.0)
MCHC: 33.3 g/dL (ref 30.0–36.0)
MCV: 88 fL (ref 78.0–100.0)
Platelets: 303 10*3/uL (ref 150–400)
RBC: 4.68 MIL/uL (ref 3.87–5.11)
RDW: 13.5 % (ref 11.5–15.5)
WBC: 13.2 10*3/uL — AB (ref 4.0–10.5)

## 2015-06-30 LAB — GLUCOSE, CAPILLARY: Glucose-Capillary: 81 mg/dL (ref 65–99)

## 2015-06-30 LAB — PROTIME-INR
INR: 1.1 (ref 0.00–1.49)
PROTHROMBIN TIME: 14.4 s (ref 11.6–15.2)

## 2015-06-30 LAB — APTT: APTT: 32 s (ref 24–37)

## 2015-06-30 MED ORDER — PREDNISONE 20 MG PO TABS
40.0000 mg | ORAL_TABLET | Freq: Two times a day (BID) | ORAL | Status: DC
  Administered 2015-06-30 – 2015-07-02 (×3): 40 mg via ORAL
  Filled 2015-06-30 (×4): qty 2

## 2015-06-30 MED ORDER — VANCOMYCIN HCL IN DEXTROSE 1-5 GM/200ML-% IV SOLN
1000.0000 mg | INTRAVENOUS | Status: AC
  Administered 2015-07-01: 1000 mg via INTRAVENOUS
  Filled 2015-06-30: qty 200

## 2015-06-30 NOTE — Progress Notes (Signed)
Procedure(s) (LRB): VIDEO ASSISTED THORACOSCOPY (Left) DRAINAGE OF PLEURAL EFFUSION (Left) Subjective: Jamie [name] is a 50 yo woman who was scheduled to see me as an outpatient in the near future but was admitted with a syncopal spell. She has a history of pulmonary disease and had a VATS biopsy in 2014 by Dr. Roxy Manns. Pathology was nonspecific. She has a history of rheumatoid vasculitis and is on chronic immunosuppression including mycophenolate and prednisone 4 mg daily.  She complains of 6 week history of a dry cough, left sided pleuritic chest pain, poor appetite and weight loss. No fever. No shortness of breath. She is a nonsmoker. No history of chest trauma. She had a CXR in 2015 well after her biopsy and did not have an effusion then. She saw Dr. Melvyn Novas. A CT chest showed a left pleural effusion. In late April a ultrasound directed thoracentesis removed 1 cc of bloody fluid which was culture negative, cytology negative.   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 On the day of admission she walked into a patient's room, became dizzy and "blacked out. " No prior history of syncope, arrhythmia, or stroke. She states she was probably dehydrated.  An echocardiogram showed normal biventricular function, no valvular insufficiency, no pericardial effusion.  Objective: Vital signs in last 24 hours: Temp:  [98.1 F (36.7 C)-98.5 F (36.9 C)] 98.4 F (36.9 C) (05/17 1415) Pulse Rate:  [85-109] 109 (05/17 1415) Cardiac Rhythm:  [-] Normal sinus rhythm (05/17 0703) Resp:  [18-21] 21 (05/17 1415) BP: (123-139)/(74-90) 123/79 mmHg (05/17 1415) SpO2:  [97 %-100 %] 99 % (05/17 1501) Weight:  [148 lb 14.4 oz (67.541 kg)] 148 lb 14.4 oz (67.541 kg) (05/17 0524)  Hemodynamic parameters for last 24 hours:    Intake/Output from previous day: 05/16 0701 - 05/17 0700 In: 960 [P.O.:960] Out: 300 [Urine:300] Intake/Output this shift:    General appearance: alert, cooperative and no distress Neurologic:  intact Heart: regular rate and rhythm Lungs: diminished breath sounds left base and lateral Abdomen: normal findings: soft, non-tender  Lab Results:  Recent Labs  06/28/15 0426 06/29/15 0446  WBC 10.3 8.6  HGB 13.6 12.4  HCT 42.4 39.2  PLT 182 270   BMET:  Recent Labs  06/28/15 0426 06/29/15 0446  NA 138 139  K 3.1* 3.4*  CL 103 103  CO2 24 24  GLUCOSE 91 88  BUN 11 6  CREATININE 0.94 0.83  CALCIUM 9.2 9.2    PT/INR:  Recent Labs  06/28/15 1028  LABPROT 14.9  INR 1.15   ABG    Component Value Date/Time   PHART 7.461* 10/14/2012 1300   HCO3 23.8 10/14/2012 1300   TCO2 28 03/03/2013 1930   O2SAT 96.6 10/14/2012 1300   CBG (last 3)   Recent Labs  06/28/15 0405 06/29/15 0634 06/30/15 0623  GLUCAP 82 93 81   CT CHEST CT ANGIOGRAPHY CHEST WITH CONTRAST  TECHNIQUE: Multidetector CT imaging of the chest was performed using the standard protocol during bolus administration of intravenous contrast. Multiplanar CT image reconstructions and MIPs were obtained to evaluate the vascular anatomy.  CONTRAST: 100 mL Isovue 370 nonionic  COMPARISON: Chest CT June 08, 2015; chest radiograph Jun 28, 2015  FINDINGS: Mediastinum/Lymph Nodes: There is no demonstrable pulmonary embolus. There is no thoracic aortic aneurysm or dissection. The visualized great vessels appear unremarkable. Pericardium is not appreciably thickened. There is left ventricular hypertrophy.  Visualized thyroid appears normal. Multiple small lymph nodes are stable. Largest individual lymph  node is noted to the left of the aortic arch anteriorly measuring 1.3 x 1.0 cm.  Lungs/Pleura: There is a moderate loculated pleural fluid collection on the left which appears stable. Mild pleural enhancement at the level of this loculated fluid collection remains stable. There is consolidation adjacent to this pleural fluid in the left lower lobe region which is stable. There are patchy  areas of atelectasis bilaterally which are stable. There is mild cavitation in an area of consolidation in the posterior segment of the right upper lobe which is stable. A smaller area of cavitation along the periphery of the anterior segment of the right upper lobe is also stable. There is no new parenchymal lung opacity compared to recent prior study. There is mild upper and lower lobe bronchiectatic change bilaterally, stable.  Upper abdomen: There are multiple hepatic cysts which appears stable. Small splenules on the left are stable. Visualized upper abdominal structures are unchanged and otherwise unremarkable.  Musculoskeletal: There are no blastic or lytic bone lesions.  Review of the MIP images confirms the above findings.  IMPRESSION: No demonstrable pulmonary embolus. Stable loculated effusion on the left with enhancement of the adjacent pleura. There is persistent adjacent left lower lobe consolidation. The pleural enhancement may be secondary to either infection or reaction to the autoimmune vasculitis.  Scattered areas of atelectasis are stable. Areas of cavitation in the periphery of the anterior posterior segments of the right upper lobe remains, likely sequela of the autoimmune vasculitis/rheumatoid lung disease. No new opacity evident. Mild bronchiectatic change stable.  Borderline prominent lymph nodes adjacent to the aortic arch on the left. No new lymph node prominence.   Electronically Signed  By: Lowella Grip III M.D.  On: 06/28/2015 07:09  I personally reviewed the CT chest and concur with the findings noted above. Assessment/Plan: 50 yo woman with a past history of rheumatoid vasculitis and possible rheumatoid lung who presents with a 6 week history of cough, left sided pleuritic chest pain, malaise, poor appetite and weight loss. She has a complex left pleural effusion. An earlier attempt at US guided thoracentesis was unsuccessful in  draining the fluid or establishing an etiology. She needs a left VATS to drain the effusion and likely will need a decortication to get the lung to reexpand.  I discussed the proposed operation with her. I informed her of the incisions to be used, the need for general anesthesia, the use of chest tubes postop, the expected hospital stay and overall recovery. I reviewed the indications, risks, benefits and alternatives. She understands the risks include, but are not limited to death, MI, stroke, DVT/PE, bleeding, possible need for transfusion, infection, prolonged air leaks, cardiac arrhythmias, as well as the possibility of other unforeseeable complications.  She accepts the risks and agrees to proceed.  Melrose Nakayama 06/30/2015

## 2015-06-30 NOTE — Anesthesia Preprocedure Evaluation (Signed)
Anesthesia Evaluation  Patient identified by MRN, date of birth, ID band Patient awake    Reviewed: Allergy & Precautions, H&P , NPO status , Patient's Chart, lab work & pertinent test results  Airway Mallampati: II  TM Distance: >3 FB Neck ROM: Full    Dental no notable dental hx. (+) Teeth Intact, Dental Advisory Given   Pulmonary neg pulmonary ROS,     + decreased breath sounds      Cardiovascular hypertension, On Medications + Peripheral Vascular Disease  + dysrhythmias  Rhythm:Regular Rate:Normal     Neuro/Psych  Headaches, negative psych ROS   GI/Hepatic negative GI ROS, Neg liver ROS,   Endo/Other  negative endocrine ROS  Renal/GU negative Renal ROS     Musculoskeletal  (+) Arthritis ,   Abdominal   Peds  Hematology negative hematology ROS (+)   Anesthesia Other Findings   Reproductive/Obstetrics negative OB ROS                             Anesthesia Physical  Anesthesia Plan  ASA: III  Anesthesia Plan: General   Post-op Pain Management:    Induction: Intravenous  Airway Management Planned: Double Lumen EBT  Additional Equipment: Arterial line  Intra-op Plan:   Post-operative Plan: Extubation in OR and Possible Post-op intubation/ventilation  Informed Consent: I have reviewed the patients History and Physical, chart, labs and discussed the procedure including the risks, benefits and alternatives for the proposed anesthesia with the patient or authorized representative who has indicated his/her understanding and acceptance.   Dental advisory given  Plan Discussed with: CRNA  Anesthesia Plan Comments: (2x large bore piv)        Anesthesia Quick Evaluation

## 2015-06-30 NOTE — Progress Notes (Signed)
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 She also has a history of vasculitis and is on chronic immunosuppressants including mycophenolate and prednisone. Presented with a few day history of cough and then subsequently had a syncopal episode. Found to have a loculated pleural effusion on the left. She is admitted for further management.  Past medical history:  Past Medical History  Diagnosis Date  . Hypertension   . Multiple thyroid nodules   . History of cold sores   . 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 Photosensitivity 04/17/2012    3/14 new   . Pulmonary nodules/lesions, multiple 10/13/2012  . Pneumomediastinum ([place])     Pneumomediastinum in the setting of progressive nodular pulmonary infiltrates with dermatologic vasculitis/notes 12/10/2012  . Dysrhythmia     ST/notes 12/10/2012  . Pneumonia 02/2012    "once" (12/10/2012)  . 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 Rheumatoid arthritis ([place])     "legs; arms" (12/10/2012)  . Pneumomediastinum ([place]) 03/03/2013    Consultants: Thoracic surgery  Procedures:  Transthoracic echocardiogram Study Conclusions - Left ventricle: Abnormal septal motion The cavity size was normal. Systolic function was normal. The estimated ejection fraction was in the range of 55% to 60%. Wall motion was normal; there were no regional wall motion abnormalities. Left ventricular diastolic function parameters were normal. - Left atrium: The atrium was mildly dilated. - Atrial septum: No defect or patent foramen ovale was identified.  Antibiotics: None  Subjective: Patient feels well this morning. Denies any chest pain or shortness of breath. No further syncopal episodes. No  nausea, vomiting. No Abdominal pain.  Objective:  Vital Signs  Filed Vitals:   06/30/15 0524 06/30/15 1114 06/30/15 1415 06/30/15 1501  BP: 124/74 139/89 123/79   Pulse: 85 95 109   Temp: 98.3 F (36.8 C) 98.5 F (36.9 C) 98.4 F (36.9 C)   TempSrc: Oral Oral Oral   Resp: 20 20 21    Height:      Weight: 67.541 kg (148 lb 14.4 oz)     SpO2: 97% 100% 100% 99%    Intake/Output Summary (Last 24 hours) at 06/30/15 1510 Last data filed at 06/29/15 1820  Gross per 24 hour  Intake    240 ml  Output      0 ml  Net    240 ml   Filed Weights   06/28/15 0835 06/29/15 0516 06/30/15 0524  Weight: 67.631 kg (149 lb 1.6 oz) 67.858 kg (149 lb 9.6 oz) 67.541 kg (148 lb 14.4 oz)    General appearance: alert, cooperative, appears stated age and no distress Resp: Reduced Air entry in the left base. Dullness to percussion. No wheezing, rales or rhonchi. Cardio: regular rate and rhythm, S1, S2 normal, no murmur, click, rub or gallop GI: soft, non-tender; bowel sounds normal; no masses,  no organomegaly Extremities: extremities normal, atraumatic, no cyanosis or edema Neurologic: Awake and alert. Oriented 3. No focal neurological signs.  Lab Results:  Data Reviewed: I have personally reviewed following labs and imaging studies  CBC:  Recent Labs Lab 06/28/15 0426 06/29/15 0446  WBC 10.3 8.6  HGB 13.6 12.4  HCT 42.4 39.2  MCV 88.9 89.1  PLT 182 [id number]   Basic Metabolic Panel:  Recent  Labs Lab 06/28/15 0426 06/28/15 1028 06/29/15 0446  NA 138  --  139  K 3.1*  --  3.4*  CL 103  --  103  CO2 24  --  24  GLUCOSE 91  --  88  BUN 11  --  6  CREATININE 0.94  --  0.83  CALCIUM 9.2  --  9.2  MG 2.1  --   --   PHOS  --  3.3  --    GFR: Estimated Creatinine Clearance: 78 mL/min (by C-G formula based on Cr of 0.83).  Liver Function Tests:  Recent Labs Lab 06/29/15 0446  AST 17  ALT 13*  ALKPHOS 98  BILITOT 0.5  PROT 7.2  ALBUMIN 3.2*   Coagulation Profile:  Recent  Labs Lab 06/28/15 1028  INR 1.15   Cardiac Enzymes:  Recent Labs Lab 06/28/15 1028 06/28/15 1252 06/28/15 1546  TROPONINI <0.03 <0.03 <0.03   CBG:  Recent Labs Lab 06/28/15 0405 06/29/15 0634 06/30/15 0623  GLUCAP 82 93 81   Thyroid Function Tests:  Recent Labs  06/28/15 1028  TSH 0.659   Urine analysis:    Component Value Date/Time   COLORURINE YELLOW 06/28/2015 0928   APPEARANCEUR CLEAR 06/28/2015 0928   LABSPEC 1.024 06/28/2015 0928   PHURINE 7.5 06/28/2015 0928   GLUCOSEU NEGATIVE 06/28/2015 0928   GLUCOSEU NEGATIVE 10/27/2010 0734   HGBUR NEGATIVE 06/28/2015 0928   BILIRUBINUR NEGATIVE 06/28/2015 0928   [place] 06/28/2015 0928   PROTEINUR NEGATIVE 06/28/2015 0928   UROBILINOGEN 1.0 10/18/2012 0548   NITRITE NEGATIVE 06/28/2015 0928   LEUKOCYTESUR NEGATIVE 06/28/2015 0928     Radiology Studies: No results found.   Medications:  Scheduled: . amLODipine  10 mg Oral Daily  . hydroxychloroquine  400 mg Oral QODAY   And  . hydroxychloroquine  300 mg Oral QODAY  . mycophenolate  1,000 mg Oral BID  . potassium chloride SA  20 mEq Oral Daily  . predniSONE  40 mg Oral BID WC  . sodium chloride flush  3 mL Intravenous Q12H  . sodium chloride flush  3 mL Intravenous Q12H   Continuous:  [id number] **OR** acetaminophen, gi cocktail, ondansetron **OR** ondansetron (ZOFRAN) IV, oxyCODONE, pantoprazole, promethazine-codeine  Assessment/Plan:  Principal Problem:   Syncope Active Problems:   Essential hypertension   Rheumatoid vasculitis (HCC)   Pulmonary nodules/lesions, multiple   Pleural effusion, left   GERD (gastroesophageal reflux disease)   Pain in the chest   Chest pain    Syncope Etiology remains unclear. However, possibly due to vasovagal event. She had been coughing more in the last few days, which could've triggered her episode. Echocardiogram as above. Showed normal EF. Continue to monitor clinically.  Left-sided  loculated pleural effusion Plan is for a VATS procedure tomorrow. Cardiothoracic surgery has evaluated the patient. Patient does have a history of vasculitis and possible rheumatoid lung disease. These could be possible etiologies for her pleural effusion. WBC is normal.  Rheumatoid vasculitis Patient is on mycophenolate and Plaquenil. Patient is also on steroids. Due to need for surgery, we'll increase her dose of prednisone. This can subsequently be tapered down quickly.  History of essential hypertension Stable. Continue amlodipine.   DVT Prophylaxis: SCDs    Code Status: Full code  Family Communication: Discussed with the patient  Disposition Plan: Await surgery      [facility] Pager [phone] 06/30/2015, 3:10 PM  If 7PM-7AM, please contact night-coverage at [email or web address], password [facility]

## 2015-07-01 ENCOUNTER — Observation Stay (HOSPITAL_COMMUNITY): Payer: Commercial Managed Care - HMO | Admitting: Anesthesiology

## 2015-07-01 ENCOUNTER — Inpatient Hospital Stay (HOSPITAL_COMMUNITY): Payer: Commercial Managed Care - HMO

## 2015-07-01 ENCOUNTER — Encounter (HOSPITAL_COMMUNITY)
Admission: EM | Disposition: A | Discharge status: Home / Self Care | Payer: Self-pay | Source: Home / Self Care | Attending: Thoracic Surgery (Cardiothoracic Vascular Surgery)

## 2015-07-01 ENCOUNTER — Encounter (HOSPITAL_COMMUNITY): Payer: Self-pay | Admitting: Anesthesiology

## 2015-07-01 DIAGNOSIS — E860 Dehydration: Secondary | ICD-10-CM | POA: Diagnosis present

## 2015-07-01 DIAGNOSIS — J942 Hemothorax: Secondary | ICD-10-CM | POA: Diagnosis present

## 2015-07-01 DIAGNOSIS — M0520 Rheumatoid vasculitis with rheumatoid arthritis of unspecified site: Secondary | ICD-10-CM | POA: Diagnosis present

## 2015-07-01 DIAGNOSIS — J9811 Atelectasis: Secondary | ICD-10-CM | POA: Diagnosis not present

## 2015-07-01 DIAGNOSIS — D649 Anemia, unspecified: Secondary | ICD-10-CM | POA: Diagnosis not present

## 2015-07-01 DIAGNOSIS — I313 Pericardial effusion (noninflammatory): Secondary | ICD-10-CM | POA: Diagnosis not present

## 2015-07-01 DIAGNOSIS — R079 Chest pain, unspecified: Secondary | ICD-10-CM | POA: Diagnosis present

## 2015-07-01 DIAGNOSIS — J941 Fibrothorax: Secondary | ICD-10-CM | POA: Diagnosis present

## 2015-07-01 DIAGNOSIS — J869 Pyothorax without fistula: Secondary | ICD-10-CM | POA: Diagnosis present

## 2015-07-01 DIAGNOSIS — Z7952 Long term (current) use of systemic steroids: Secondary | ICD-10-CM | POA: Diagnosis not present

## 2015-07-01 DIAGNOSIS — I10 Essential (primary) hypertension: Secondary | ICD-10-CM | POA: Diagnosis present

## 2015-07-01 DIAGNOSIS — Z792 Long term (current) use of antibiotics: Secondary | ICD-10-CM | POA: Diagnosis not present

## 2015-07-01 HISTORY — PX: VIDEO ASSISTED THORACOSCOPY (VATS)/DECORTICATION: SHX6171

## 2015-07-01 LAB — BLOOD GAS, ARTERIAL
Acid-base deficit: 0.8 mmol/L (ref 0.0–2.0)
Bicarbonate: 23 mEq/L (ref 20.0–24.0)
Drawn by: 225631
FIO2: 0.21
O2 SAT: 97.1 %
PO2 ART: 92.2 mmHg (ref 80.0–100.0)
Patient temperature: 98.6
TCO2: 24.1 mmol/L (ref 0–100)
pCO2 arterial: 35.9 mmHg (ref 35.0–45.0)
pH, Arterial: 7.423 (ref 7.350–7.450)

## 2015-07-01 LAB — CBC
HCT: 38.3 % (ref 36.0–46.0)
HEMOGLOBIN: 12.6 g/dL (ref 12.0–15.0)
MCH: 28.8 pg (ref 26.0–34.0)
MCHC: 32.9 g/dL (ref 30.0–36.0)
MCV: 87.6 fL (ref 78.0–100.0)
PLATELETS: 295 10*3/uL (ref 150–400)
RBC: 4.37 MIL/uL (ref 3.87–5.11)
RDW: 13.3 % (ref 11.5–15.5)
WBC: 12.8 10*3/uL — ABNORMAL HIGH (ref 4.0–10.5)

## 2015-07-01 LAB — GLUCOSE, CAPILLARY: Glucose-Capillary: 101 mg/dL — ABNORMAL HIGH (ref 65–99)

## 2015-07-01 SURGERY — VIDEO ASSISTED THORACOSCOPY (VATS)/DECORTICATION
Anesthesia: General | Site: Chest | Laterality: Left

## 2015-07-01 MED ORDER — PROPOFOL 10 MG/ML IV BOLUS
INTRAVENOUS | Status: AC
  Filled 2015-07-01: qty 20

## 2015-07-01 MED ORDER — GLYCOPYRROLATE 0.2 MG/ML IJ SOLN
INTRAMUSCULAR | Status: DC | PRN
  Administered 2015-07-01: 0.6 mg via INTRAVENOUS

## 2015-07-01 MED ORDER — MEPERIDINE HCL 25 MG/ML IJ SOLN
6.2500 mg | INTRAMUSCULAR | Status: DC | PRN
Start: 2015-07-01 — End: 2015-07-01

## 2015-07-01 MED ORDER — FENTANYL 40 MCG/ML IV SOLN
INTRAVENOUS | Status: AC
  Filled 2015-07-01: qty 25

## 2015-07-01 MED ORDER — SODIUM CHLORIDE 0.9% FLUSH: 9.0000 mL | INTRAVENOUS | Status: DC | PRN

## 2015-07-01 MED ORDER — LIDOCAINE HCL (CARDIAC) 20 MG/ML IV SOLN
INTRAVENOUS | Status: DC | PRN
  Administered 2015-07-01: 50 mg via INTRAVENOUS

## 2015-07-01 MED ORDER — FENTANYL 40 MCG/ML IV SOLN
INTRAVENOUS | Status: DC
  Administered 2015-07-01: 30 ug via INTRAVENOUS
  Administered 2015-07-01: 13:00:00 via INTRAVENOUS
  Administered 2015-07-02 (×2): 15 ug via INTRAVENOUS
  Administered 2015-07-02 (×2): 45 ug via INTRAVENOUS
  Administered 2015-07-02: 75 ug via INTRAVENOUS
  Administered 2015-07-03: 45 ug via INTRAVENOUS
  Administered 2015-07-03: 0 ug via INTRAVENOUS
  Administered 2015-07-03: 15 ug via INTRAVENOUS
  Administered 2015-07-03: 60 ug via INTRAVENOUS
  Administered 2015-07-03: 90 ug via INTRAVENOUS
  Administered 2015-07-04: 45 ug via INTRAVENOUS
  Administered 2015-07-04: 60 ug via INTRAVENOUS
  Administered 2015-07-04: 30 ug via INTRAVENOUS
  Administered 2015-07-04: 15 ug via INTRAVENOUS
  Administered 2015-07-04 (×2): 30 ug via INTRAVENOUS
  Administered 2015-07-05: 0 ug via INTRAVENOUS
  Administered 2015-07-05 (×2): 30 ug via INTRAVENOUS

## 2015-07-01 MED ORDER — FENTANYL CITRATE (PF) 250 MCG/5ML IJ SOLN
INTRAMUSCULAR | Status: AC
  Filled 2015-07-01: qty 5

## 2015-07-01 MED ORDER — DIPHENHYDRAMINE HCL 12.5 MG/5ML PO ELIX: 12.5000 mg | ORAL_SOLUTION | Freq: Four times a day (QID) | ORAL | Status: DC | PRN

## 2015-07-01 MED ORDER — PHENYLEPHRINE HCL 10 MG/ML IJ SOLN
10.0000 mg | INTRAVENOUS | Status: DC | PRN
  Administered 2015-07-01: 80 ug/min via INTRAVENOUS

## 2015-07-01 MED ORDER — ACETAMINOPHEN 500 MG PO TABS
1000.0000 mg | ORAL_TABLET | Freq: Four times a day (QID) | ORAL | Status: DC
Start: 2015-07-01 — End: 2015-07-06
  Administered 2015-07-01: 1000 mg via ORAL
  Filled 2015-07-01 (×4): qty 2

## 2015-07-01 MED ORDER — FENTANYL CITRATE (PF) 100 MCG/2ML IJ SOLN
INTRAMUSCULAR | Status: DC | PRN
  Administered 2015-07-01: 100 ug via INTRAVENOUS
  Administered 2015-07-01 (×3): 50 ug via INTRAVENOUS
  Administered 2015-07-01: 100 ug via INTRAVENOUS
  Administered 2015-07-01: 150 ug via INTRAVENOUS

## 2015-07-01 MED ORDER — 0.9 % SODIUM CHLORIDE (POUR BTL) OPTIME
TOPICAL | Status: DC | PRN
  Administered 2015-07-01: 2000 mL

## 2015-07-01 MED ORDER — MIDAZOLAM HCL 5 MG/5ML IJ SOLN
INTRAMUSCULAR | Status: DC | PRN
  Administered 2015-07-01: 2 mg via INTRAVENOUS

## 2015-07-01 MED ORDER — HYDROMORPHONE HCL 1 MG/ML IJ SOLN
INTRAMUSCULAR | Status: AC
  Filled 2015-07-01: qty 1

## 2015-07-01 MED ORDER — ONDANSETRON HCL 4 MG/2ML IJ SOLN
INTRAMUSCULAR | Status: AC
  Filled 2015-07-01: qty 2

## 2015-07-01 MED ORDER — ONDANSETRON HCL 4 MG/2ML IJ SOLN
4.0000 mg | Freq: Four times a day (QID) | INTRAMUSCULAR | Status: DC | PRN
Start: 2015-07-01 — End: 2015-07-05

## 2015-07-01 MED ORDER — METOPROLOL TARTRATE 5 MG/5ML IV SOLN
INTRAVENOUS | Status: DC | PRN
  Administered 2015-07-01: 1 mg via INTRAVENOUS

## 2015-07-01 MED ORDER — DEXTROSE 5 % IV SOLN
1.5000 g | Freq: Two times a day (BID) | INTRAVENOUS | Status: AC
  Administered 2015-07-01 – 2015-07-02 (×2): 1.5 g via INTRAVENOUS
  Filled 2015-07-01 (×2): qty 1.5

## 2015-07-01 MED ORDER — HYDROCORTISONE NA SUCCINATE PF 100 MG IJ SOLR
INTRAMUSCULAR | Status: DC | PRN
  Administered 2015-07-01: 100 mg via INTRAVENOUS

## 2015-07-01 MED ORDER — MIDAZOLAM HCL 2 MG/2ML IJ SOLN
INTRAMUSCULAR | Status: AC
  Filled 2015-07-01: qty 2

## 2015-07-01 MED ORDER — METOPROLOL TARTRATE 5 MG/5ML IV SOLN
INTRAVENOUS | Status: AC
  Filled 2015-07-01: qty 5

## 2015-07-01 MED ORDER — HYDROMORPHONE HCL 1 MG/ML IJ SOLN
0.2500 mg | INTRAMUSCULAR | Status: DC | PRN
  Administered 2015-07-01 (×3): 0.5 mg via INTRAVENOUS

## 2015-07-01 MED ORDER — BISACODYL 5 MG PO TBEC
10.0000 mg | DELAYED_RELEASE_TABLET | Freq: Every day | ORAL | Status: DC
  Filled 2015-07-01 (×6): qty 2

## 2015-07-01 MED ORDER — PROPOFOL 10 MG/ML IV BOLUS
INTRAVENOUS | Status: DC | PRN
  Administered 2015-07-01: 20 mg via INTRAVENOUS
  Administered 2015-07-01: 180 mg via INTRAVENOUS

## 2015-07-01 MED ORDER — ONDANSETRON HCL 4 MG/2ML IJ SOLN: 4.0000 mg | Freq: Four times a day (QID) | INTRAMUSCULAR | Status: DC | PRN

## 2015-07-01 MED ORDER — ONDANSETRON HCL 4 MG/2ML IJ SOLN
INTRAMUSCULAR | Status: DC | PRN
  Administered 2015-07-01 (×2): 4 mg via INTRAVENOUS

## 2015-07-01 MED ORDER — DEXTROSE-NACL 5-0.9 % IV SOLN
INTRAVENOUS | Status: DC
  Administered 2015-07-01 – 2015-07-02 (×3): via INTRAVENOUS
  Administered 2015-07-03: 50 mL/h via INTRAVENOUS

## 2015-07-01 MED ORDER — NALOXONE HCL 0.4 MG/ML IJ SOLN: 0.4000 mg | INTRAMUSCULAR | Status: DC | PRN

## 2015-07-01 MED ORDER — ROCURONIUM BROMIDE 50 MG/5ML IV SOLN
INTRAVENOUS | Status: AC
  Filled 2015-07-01: qty 1

## 2015-07-01 MED ORDER — VANCOMYCIN HCL IN DEXTROSE 1-5 GM/200ML-% IV SOLN: 1000.0000 mg | INTRAVENOUS | Status: DC

## 2015-07-01 MED ORDER — ACETAMINOPHEN 160 MG/5ML PO SOLN
1000.0000 mg | Freq: Four times a day (QID) | ORAL | Status: DC
Start: 2015-07-01 — End: 2015-07-06
  Administered 2015-07-01 – 2015-07-02 (×4): 1000 mg via ORAL
  Filled 2015-07-01 (×5): qty 40.6

## 2015-07-01 MED ORDER — LACTATED RINGERS IV SOLN
INTRAVENOUS | Status: DC | PRN
  Administered 2015-07-01 (×2): via INTRAVENOUS

## 2015-07-01 MED ORDER — NEOSTIGMINE METHYLSULFATE 10 MG/10ML IV SOLN
INTRAVENOUS | Status: DC | PRN
  Administered 2015-07-01: 4 mg via INTRAVENOUS

## 2015-07-01 MED ORDER — HYDROCORTISONE NA SUCCINATE PF 100 MG IJ SOLR
100.0000 mg | INTRAMUSCULAR | Status: DC
  Filled 2015-07-01: qty 2

## 2015-07-01 MED ORDER — POTASSIUM CHLORIDE 10 MEQ/50ML IV SOLN: 10.0000 meq | Freq: Every day | INTRAVENOUS | Status: DC | PRN

## 2015-07-01 MED ORDER — LACTATED RINGERS IV SOLN
INTRAVENOUS | Status: DC | PRN
  Administered 2015-07-01: 07:00:00 via INTRAVENOUS

## 2015-07-01 MED ORDER — HEMOSTATIC AGENTS (NO CHARGE) OPTIME
TOPICAL | Status: DC | PRN
  Administered 2015-07-01: 1 via TOPICAL

## 2015-07-01 MED ORDER — DIPHENHYDRAMINE HCL 50 MG/ML IJ SOLN
12.5000 mg | Freq: Four times a day (QID) | INTRAMUSCULAR | Status: DC | PRN
  Filled 2015-07-01: qty 1

## 2015-07-01 MED ORDER — SENNOSIDES-DOCUSATE SODIUM 8.6-50 MG PO TABS
1.0000 | ORAL_TABLET | Freq: Every day | ORAL | Status: DC
  Filled 2015-07-01 (×3): qty 1

## 2015-07-01 MED ORDER — PHENYLEPHRINE HCL 10 MG/ML IJ SOLN
INTRAMUSCULAR | Status: DC | PRN
  Administered 2015-07-01: 40 ug via INTRAVENOUS

## 2015-07-01 MED ORDER — OXYCODONE HCL 5 MG PO TABS: 5.0000 mg | ORAL_TABLET | ORAL | Status: DC | PRN

## 2015-07-01 MED ORDER — ROCURONIUM BROMIDE 100 MG/10ML IV SOLN
INTRAVENOUS | Status: DC | PRN
  Administered 2015-07-01: 10 mg via INTRAVENOUS
  Administered 2015-07-01: 50 mg via INTRAVENOUS
  Administered 2015-07-01: 20 mg via INTRAVENOUS

## 2015-07-01 SURGICAL SUPPLY — 68 items
ADH SKN CLS APL DERMABOND .7 (GAUZE/BANDAGES/DRESSINGS) ×2
APPLIER CLIP ROT 10 11.4 M/L (STAPLE)
APR CLP MED LRG 11.4X10 (STAPLE)
CANISTER SUCTION 2500CC (MISCELLANEOUS) ×4 IMPLANT
CATH THORACIC 28FR (CATHETERS) ×3 IMPLANT
CLIP APPLIE ROT 10 11.4 M/L (STAPLE) IMPLANT
CLIP TI MEDIUM 6 (CLIP) ×3 IMPLANT
CONN ST 1/4X3/8  BEN (MISCELLANEOUS) ×4
CONN ST 1/4X3/8 BEN (MISCELLANEOUS) ×2 IMPLANT
CONN Y 3/8X3/8X3/8  BEN (MISCELLANEOUS) ×4
CONN Y 3/8X3/8X3/8 BEN (MISCELLANEOUS) ×3 IMPLANT
CONT SPEC 4OZ CLIKSEAL STRL BL (MISCELLANEOUS) ×26 IMPLANT
COVER SURGICAL LIGHT HANDLE (MISCELLANEOUS) ×4 IMPLANT
DERMABOND ADVANCED (GAUZE/BANDAGES/DRESSINGS) ×2
DERMABOND ADVANCED .7 DNX12 (GAUZE/BANDAGES/DRESSINGS) ×1 IMPLANT
DRAIN CHANNEL 28F RND 3/8 FF (WOUND CARE) ×6 IMPLANT
DRAPE LAPAROSCOPIC ABDOMINAL (DRAPES) ×4 IMPLANT
DRAPE WARM FLUID 44X44 (DRAPE) ×4 IMPLANT
ELECT BLADE 4.0 EZ CLEAN MEGAD (MISCELLANEOUS) ×4
ELECT BLADE 6.5 EXT (BLADE) ×6 IMPLANT
ELECT REM PT RETURN 9FT ADLT (ELECTROSURGICAL) ×4
ELECTRODE BLDE 4.0 EZ CLN MEGD (MISCELLANEOUS) ×1 IMPLANT
ELECTRODE REM PT RTRN 9FT ADLT (ELECTROSURGICAL) ×2 IMPLANT
GAUZE SPONGE 4X4 12PLY STRL (GAUZE/BANDAGES/DRESSINGS) ×4 IMPLANT
GLOVE BIO SURGEON STRL SZ7 (GLOVE) ×6 IMPLANT
GLOVE BIOGEL PI IND STRL 6.5 (GLOVE) ×4 IMPLANT
GLOVE BIOGEL PI IND STRL 7.0 (GLOVE) ×1 IMPLANT
GLOVE BIOGEL PI INDICATOR 6.5 (GLOVE) ×8
GLOVE BIOGEL PI INDICATOR 7.0 (GLOVE) ×2
GLOVE SURG SIGNA 7.5 PF LTX (GLOVE) ×8 IMPLANT
GLOVE SURG SS PI 7.0 STRL IVOR (GLOVE) ×9 IMPLANT
GLOVE SURG SS PI 7.5 STRL IVOR (GLOVE) ×3 IMPLANT
GOWN STRL REUS W/ TWL LRG LVL3 (GOWN DISPOSABLE) ×5 IMPLANT
GOWN STRL REUS W/ TWL XL LVL3 (GOWN DISPOSABLE) ×4 IMPLANT
GOWN STRL REUS W/TWL LRG LVL3 (GOWN DISPOSABLE) ×12
GOWN STRL REUS W/TWL XL LVL3 (GOWN DISPOSABLE) ×12
HEMOSTAT SURGICEL 2X14 (HEMOSTASIS) ×3 IMPLANT
KIT BASIN OR (CUSTOM PROCEDURE TRAY) ×4 IMPLANT
KIT ROOM TURNOVER OR (KITS) ×4 IMPLANT
KIT SUCTION CATH 14FR (SUCTIONS) ×4 IMPLANT
NS IRRIG 1000ML POUR BTL (IV SOLUTION) ×11 IMPLANT
PACK CHEST (CUSTOM PROCEDURE TRAY) ×4 IMPLANT
PAD ARMBOARD 7.5X6 YLW CONV (MISCELLANEOUS) ×8 IMPLANT
PENCIL BUTTON HOLSTER BLD 10FT (ELECTRODE) ×3 IMPLANT
SEALANT PROGEL (MISCELLANEOUS) IMPLANT
SOLUTION ANTI FOG 6CC (MISCELLANEOUS) ×4 IMPLANT
SPECIMEN JAR MEDIUM (MISCELLANEOUS) ×1 IMPLANT
SPONGE GAUZE 4X4 12PLY STER LF (GAUZE/BANDAGES/DRESSINGS) ×3 IMPLANT
SPONGE INTESTINAL PEANUT (DISPOSABLE) ×48 IMPLANT
SPONGE TONSIL 1 RF SGL (DISPOSABLE) ×4 IMPLANT
SUT SILK  1 MH (SUTURE) ×6
SUT SILK 1 MH (SUTURE) ×5 IMPLANT
SUT VIC AB 1 CTX 36 (SUTURE) ×8
SUT VIC AB 1 CTX36XBRD ANBCTR (SUTURE) ×2 IMPLANT
SUT VIC AB 2-0 CTX 36 (SUTURE) ×6 IMPLANT
SUT VIC AB 3-0 X1 27 (SUTURE) ×4 IMPLANT
SUT VICRYL 2 TP 1 (SUTURE) ×3 IMPLANT
SYSTEM SAHARA CHEST DRAIN ATS (WOUND CARE) ×4 IMPLANT
TAPE CLOTH 4X10 WHT NS (GAUZE/BANDAGES/DRESSINGS) ×4 IMPLANT
TAPE CLOTH SURG 6X10 WHT LF (GAUZE/BANDAGES/DRESSINGS) ×3 IMPLANT
TIP APPLICATOR SPRAY EXTEND 16 (VASCULAR PRODUCTS) IMPLANT
TOWEL OR 17X24 6PK STRL BLUE (TOWEL DISPOSABLE) ×4 IMPLANT
TOWEL OR 17X26 10 PK STRL BLUE (TOWEL DISPOSABLE) ×8 IMPLANT
TRAP SPECIMEN MUCOUS 40CC (MISCELLANEOUS) ×6 IMPLANT
TRAY FOLEY CATH 16FRSI W/METER (SET/KITS/TRAYS/PACK) ×4 IMPLANT
TROCAR XCEL BLADELESS 5X75MML (TROCAR) ×4 IMPLANT
WATER STERILE IRR 1000ML POUR (IV SOLUTION) ×5 IMPLANT
YANKAUER SUCT BULB TIP NO VENT (SUCTIONS) ×3 IMPLANT

## 2015-07-01 NOTE — Progress Notes (Signed)
Patient was in the operating room most of the day today. She was undergoing a VATS procedure. Discussed briefly with Dr. Roxan Hockey this afternoon. He would like to assume this patient's care. Patient has been admitted to the surgical ICU postoperatively. It should be noted that she is on a higher dose of prednisone than usual to provide stress dosing. This should be tapered down back to her usual dose in the next few days as long as she remains stable.  TRH will be available as needed.  Bonnielee Haff 07/01/2015

## 2015-07-01 NOTE — Transfer of Care (Signed)
Immediate Anesthesia Transfer of Care Note  Patient: Jamie [name]  Procedure(s) Performed: Procedure(s): VIDEO ASSISTED THORACOSCOPY (VATS)/DECORTICATION (Left)  Patient Location: PACU  Anesthesia Type:General  Level of Consciousness: awake, alert , oriented and patient cooperative  Airway & Oxygen Therapy: Patient Spontanous Breathing and Patient connected to nasal cannula oxygen  Post-op Assessment: Report given to RN and Post -op Vital signs reviewed and stable  Post vital signs: Reviewed and stable  Last Vitals:  Filed Vitals:   06/30/15 2021 07/01/15 0409  BP: 155/82 131/92  Pulse: 92 96  Temp: 36.6 C 36.6 C  Resp: 20 18    Last Pain:  Filed Vitals:   07/01/15 1304  PainSc: 0-No pain         Complications: No apparent anesthesia complications

## 2015-07-01 NOTE — Anesthesia Postprocedure Evaluation (Signed)
Anesthesia Post Note  Patient: Jamie [name]  Procedure(s) Performed: Procedure(s) (LRB): VIDEO ASSISTED THORACOSCOPY (VATS)/DECORTICATION (Left)  Patient location during evaluation: PACU Anesthesia Type: General Level of consciousness: sedated and patient cooperative Pain management: pain level controlled Vital Signs Assessment: post-procedure vital signs reviewed and stable Respiratory status: spontaneous breathing Cardiovascular status: stable Anesthetic complications: no    Last Vitals:  Filed Vitals:   07/01/15 1427 07/01/15 1430  BP:  127/82  Pulse: 85 73  Temp:  36.6 C  Resp: 24 19    Last Pain:  Filed Vitals:   07/01/15 1432  PainSc: [place]

## 2015-07-01 NOTE — Interval H&P Note (Signed)
History and Physical Interval Note:  07/01/2015 7:59 AM  Jamie [name]  has presented today for surgery, with the diagnosis of LOCULATED PLEURAL EFFUSION  The various methods of treatment have been discussed with the patient and family. After consideration of risks, benefits and other options for treatment, the patient has consented to  Procedure(s): VIDEO ASSISTED THORACOSCOPY (Left) DRAINAGE OF PLEURAL EFFUSION (Left) as a surgical intervention .  The patient's history has been reviewed, patient examined, no change in status, stable for surgery.  I have reviewed the patient's chart and labs.  Questions were answered to the patient's satisfaction.     Melrose Nakayama

## 2015-07-01 NOTE — Progress Notes (Signed)
Patient had 8 beat run of true V-tach per central monitoring on the way to the OR. Advised Anesthesiologist upon arrival.

## 2015-07-01 NOTE — H&P (View-Only) (Signed)
Procedure(s) (LRB): VIDEO ASSISTED THORACOSCOPY (Left) DRAINAGE OF PLEURAL EFFUSION (Left) Subjective: Jamie [name] is a 50 yo woman who was scheduled to see me as an outpatient in the near future but was admitted with a syncopal spell. She has a history of pulmonary disease and had a VATS biopsy in 2014 by Dr. Roxy Manns. Pathology was nonspecific. She has a history of rheumatoid vasculitis and is on chronic immunosuppression including mycophenolate and prednisone 4 mg daily.  She complains of 6 week history of a dry cough, left sided pleuritic chest pain, poor appetite and weight loss. No fever. No shortness of breath. She is a nonsmoker. No history of chest trauma. She had a CXR in 2015 well after her biopsy and did not have an effusion then. She saw Dr. Melvyn Novas. A CT chest showed a left pleural effusion. In late April a ultrasound directed thoracentesis removed 1 cc of bloody fluid which was culture negative, cytology negative.   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 On the day of admission she walked into a patient's room, became dizzy and "blacked out. " No prior history of syncope, arrhythmia, or stroke. She states she was probably dehydrated.  An echocardiogram showed normal biventricular function, no valvular insufficiency, no pericardial effusion.  Objective: Vital signs in last 24 hours: Temp:  [98.1 F (36.7 C)-98.5 F (36.9 C)] 98.4 F (36.9 C) (05/17 1415) Pulse Rate:  [85-109] 109 (05/17 1415) Cardiac Rhythm:  [-] Normal sinus rhythm (05/17 0703) Resp:  [18-21] 21 (05/17 1415) BP: (123-139)/(74-90) 123/79 mmHg (05/17 1415) SpO2:  [97 %-100 %] 99 % (05/17 1501) Weight:  [148 lb 14.4 oz (67.541 kg)] 148 lb 14.4 oz (67.541 kg) (05/17 0524)  Hemodynamic parameters for last 24 hours:    Intake/Output from previous day: 05/16 0701 - 05/17 0700 In: 960 [P.O.:960] Out: 300 [Urine:300] Intake/Output this shift:    General appearance: alert, cooperative and no distress Neurologic:  intact Heart: regular rate and rhythm Lungs: diminished breath sounds left base and lateral Abdomen: normal findings: soft, non-tender  Lab Results:  Recent Labs  06/28/15 0426 06/29/15 0446  WBC 10.3 8.6  HGB 13.6 12.4  HCT 42.4 39.2  PLT 182 270   BMET:  Recent Labs  06/28/15 0426 06/29/15 0446  NA 138 139  K 3.1* 3.4*  CL 103 103  CO2 24 24  GLUCOSE 91 88  BUN 11 6  CREATININE 0.94 0.83  CALCIUM 9.2 9.2    PT/INR:  Recent Labs  06/28/15 1028  LABPROT 14.9  INR 1.15   ABG    Component Value Date/Time   PHART 7.461* 10/14/2012 1300   HCO3 23.8 10/14/2012 1300   TCO2 28 03/03/2013 1930   O2SAT 96.6 10/14/2012 1300   CBG (last 3)   Recent Labs  06/28/15 0405 06/29/15 0634 06/30/15 0623  GLUCAP 82 93 81   CT CHEST CT ANGIOGRAPHY CHEST WITH CONTRAST  TECHNIQUE: Multidetector CT imaging of the chest was performed using the standard protocol during bolus administration of intravenous contrast. Multiplanar CT image reconstructions and MIPs were obtained to evaluate the vascular anatomy.  CONTRAST: 100 mL Isovue 370 nonionic  COMPARISON: Chest CT June 08, 2015; chest radiograph Jun 28, 2015  FINDINGS: Mediastinum/Lymph Nodes: There is no demonstrable pulmonary embolus. There is no thoracic aortic aneurysm or dissection. The visualized great vessels appear unremarkable. Pericardium is not appreciably thickened. There is left ventricular hypertrophy.  Visualized thyroid appears normal. Multiple small lymph nodes are stable. Largest individual lymph  node is noted to the left of the aortic arch anteriorly measuring 1.3 x 1.0 cm.  Lungs/Pleura: There is a moderate loculated pleural fluid collection on the left which appears stable. Mild pleural enhancement at the level of this loculated fluid collection remains stable. There is consolidation adjacent to this pleural fluid in the left lower lobe region which is stable. There are patchy  areas of atelectasis bilaterally which are stable. There is mild cavitation in an area of consolidation in the posterior segment of the right upper lobe which is stable. A smaller area of cavitation along the periphery of the anterior segment of the right upper lobe is also stable. There is no new parenchymal lung opacity compared to recent prior study. There is mild upper and lower lobe bronchiectatic change bilaterally, stable.  Upper abdomen: There are multiple hepatic cysts which appears stable. Small splenules on the left are stable. Visualized upper abdominal structures are unchanged and otherwise unremarkable.  Musculoskeletal: There are no blastic or lytic bone lesions.  Review of the MIP images confirms the above findings.  IMPRESSION: No demonstrable pulmonary embolus. Stable loculated effusion on the left with enhancement of the adjacent pleura. There is persistent adjacent left lower lobe consolidation. The pleural enhancement may be secondary to either infection or reaction to the autoimmune vasculitis.  Scattered areas of atelectasis are stable. Areas of cavitation in the periphery of the anterior posterior segments of the right upper lobe remains, likely sequela of the autoimmune vasculitis/rheumatoid lung disease. No new opacity evident. Mild bronchiectatic change stable.  Borderline prominent lymph nodes adjacent to the aortic arch on the left. No new lymph node prominence.   Electronically Signed  By: Lowella Grip III M.D.  On: 06/28/2015 07:09  I personally reviewed the CT chest and concur with the findings noted above. Assessment/Plan: 50 yo woman with a past history of rheumatoid vasculitis and possible rheumatoid lung who presents with a 6 week history of cough, left sided pleuritic chest pain, malaise, poor appetite and weight loss. She has a complex left pleural effusion. An earlier attempt at US guided thoracentesis was unsuccessful in  draining the fluid or establishing an etiology. She needs a left VATS to drain the effusion and likely will need a decortication to get the lung to reexpand.  I discussed the proposed operation with her. I informed her of the incisions to be used, the need for general anesthesia, the use of chest tubes postop, the expected hospital stay and overall recovery. I reviewed the indications, risks, benefits and alternatives. She understands the risks include, but are not limited to death, MI, stroke, DVT/PE, bleeding, possible need for transfusion, infection, prolonged air leaks, cardiac arrhythmias, as well as the possibility of other unforeseeable complications.  She accepts the risks and agrees to proceed.  Melrose Nakayama 06/30/2015

## 2015-07-01 NOTE — Brief Op Note (Addendum)
      [id number]       [place] [address]             [id number]     06/28/2015 - 07/01/2015  12:43 PM  PATIENT:  Jamie [name]  50 y.o. female  PRE-OPERATIVE DIAGNOSIS:  LOCULATED PLEURAL EFFUSION  POST-OPERATIVE DIAGNOSIS:  LOCULATED PLEURAL EFFUSION, HEMOTHORAX/FIBROTHORAX  PROCEDURE:   LEFT VATS DRAINAGE OF CLOTTED HEMOTHORAX DECORTICATION  SURGEON:  Surgeon(s) and Role:    * Melrose Nakayama, MD - Primary  PHYSICIAN ASSISTANT: WAYNE GOLD PA-C  ASSISTANTS: none   ANESTHESIA:   general  EBL:  Total I/O In: 1000 [I.V.:1000] Out: 550 [Urine:300; Blood:250]  BLOOD ADMINISTERED:none  DRAINS: 3 Chest Tube(s) in the LEFT HEMITHORAX   LOCAL MEDICATIONS USED:  NONE  SPECIMEN:  Source of Specimen:  PLEURAL EFFUSION/PLEURAL PEEL  DISPOSITION OF SPECIMEN:  MICRO/CYTOLOGY  COUNTS:  YES  PLAN OF CARE: Admit to inpatient   PATIENT DISPOSITION:  PACU - hemodynamically stable.   Delay start of Pharmacological VTE agent (>24hrs) due to surgical blood loss or risk of bleeding: yes

## 2015-07-02 ENCOUNTER — Encounter (HOSPITAL_COMMUNITY): Payer: Self-pay | Admitting: Thoracic Surgery (Cardiothoracic Vascular Surgery)

## 2015-07-02 ENCOUNTER — Inpatient Hospital Stay (HOSPITAL_COMMUNITY): Payer: Commercial Managed Care - HMO

## 2015-07-02 LAB — BASIC METABOLIC PANEL
Anion gap: 9 (ref 5–15)
BUN: 6 mg/dL (ref 6–20)
CHLORIDE: 101 mmol/L (ref 101–111)
CO2: 29 mmol/L (ref 22–32)
Calcium: 8.3 mg/dL — ABNORMAL LOW (ref 8.9–10.3)
Creatinine, Ser: 0.68 mg/dL (ref 0.44–1.00)
GLUCOSE: 110 mg/dL — AB (ref 65–99)
Potassium: 3.6 mmol/L (ref 3.5–5.1)
SODIUM: 139 mmol/L (ref 135–145)

## 2015-07-02 LAB — ACID FAST SMEAR (AFB): ACID FAST SMEAR - AFSCU2: NEGATIVE

## 2015-07-02 LAB — URINALYSIS, ROUTINE W REFLEX MICROSCOPIC
BILIRUBIN URINE: NEGATIVE
GLUCOSE, UA: NEGATIVE mg/dL
HGB URINE DIPSTICK: NEGATIVE
Ketones, ur: NEGATIVE mg/dL
Leukocytes, UA: NEGATIVE
NITRITE: NEGATIVE
PH: 7 (ref 5.0–8.0)
Protein, ur: NEGATIVE mg/dL
SPECIFIC GRAVITY, URINE: 1.009 (ref 1.005–1.030)

## 2015-07-02 LAB — BLOOD GAS, ARTERIAL
Acid-Base Excess: 4.2 mmol/L — ABNORMAL HIGH (ref 0.0–2.0)
Bicarbonate: 28.3 mEq/L — ABNORMAL HIGH (ref 20.0–24.0)
O2 Content: 2 L/min
O2 Saturation: 98.6 %
PATIENT TEMPERATURE: 98.6
PCO2 ART: 43 mmHg (ref 35.0–45.0)
PH ART: 7.433 (ref 7.350–7.450)
TCO2: 29.6 mmol/L (ref 0–100)
pO2, Arterial: 121 mmHg — ABNORMAL HIGH (ref 80.0–100.0)

## 2015-07-02 LAB — CBC
HEMATOCRIT: 35.4 % — AB (ref 36.0–46.0)
HEMOGLOBIN: 11.1 g/dL — AB (ref 12.0–15.0)
MCH: 27.7 pg (ref 26.0–34.0)
MCHC: 31.4 g/dL (ref 30.0–36.0)
MCV: 88.3 fL (ref 78.0–100.0)
Platelets: 224 10*3/uL (ref 150–400)
RBC: 4.01 MIL/uL (ref 3.87–5.11)
RDW: 13.8 % (ref 11.5–15.5)
WBC: 14.8 10*3/uL — AB (ref 4.0–10.5)

## 2015-07-02 LAB — MRSA PCR SCREENING: MRSA by PCR: NEGATIVE

## 2015-07-02 LAB — ACID FAST SMEAR (AFB, MYCOBACTERIA)

## 2015-07-02 MED ORDER — MYCOPHENOLATE MOFETIL 250 MG PO CAPS
1000.0000 mg | ORAL_CAPSULE | Freq: Two times a day (BID) | ORAL | Status: DC
  Administered 2015-07-02 – 2015-07-05 (×8): 1000 mg via ORAL
  Filled 2015-07-02 (×8): qty 4

## 2015-07-02 MED ORDER — METOCLOPRAMIDE HCL 5 MG/ML IJ SOLN
10.0000 mg | Freq: Four times a day (QID) | INTRAMUSCULAR | Status: AC
  Administered 2015-07-02 – 2015-07-03 (×4): 10 mg via INTRAVENOUS
  Filled 2015-07-02 (×4): qty 2

## 2015-07-02 MED ORDER — PREDNISONE 20 MG PO TABS
20.0000 mg | ORAL_TABLET | Freq: Two times a day (BID) | ORAL | Status: DC
  Administered 2015-07-02 – 2015-07-05 (×6): 20 mg via ORAL
  Filled 2015-07-02 (×6): qty 1

## 2015-07-02 MED ORDER — ENOXAPARIN SODIUM 40 MG/0.4ML ~~LOC~~ SOLN
40.0000 mg | SUBCUTANEOUS | Status: DC
  Administered 2015-07-02 – 2015-07-06 (×5): 40 mg via SUBCUTANEOUS
  Filled 2015-07-02 (×5): qty 0.4

## 2015-07-02 NOTE — Progress Notes (Signed)
1 Day Post-Op Procedure(s) (LRB): VIDEO ASSISTED THORACOSCOPY (VATS)/DECORTICATION (Left) Subjective: Some incisional pain but "not too bad" No nausea  Objective: Vital signs in last 24 hours: Temp:  [97.6 F (36.4 C)-97.9 F (36.6 C)] 97.6 F (36.4 C) (05/19 0308) Pulse Rate:  [71-98] 79 (05/19 0308) Cardiac Rhythm:  [-] Sinus tachycardia (05/19 0700) Resp:  [11-29] 21 (05/19 0628) BP: (118-142)/(73-101) 127/89 mmHg (05/19 0308) SpO2:  [99 %-100 %] 100 % (05/19 0628) Arterial Line BP: (93-145)/(64-73) 93/64 mmHg (05/18 2000) Weight:  [149 lb 14.4 oz (67.994 kg)] 149 lb 14.4 oz (67.994 kg) (05/18 1605)  Hemodynamic parameters for last 24 hours:    Intake/Output from previous day: 05/18 0701 - 05/19 0700 In: 3790 [P.O.:240; I.V.:3500; IV Piggyback:50] Out: 3010 [Urine:2550; Blood:250; Chest Tube:210] Intake/Output this shift:    General appearance: alert, cooperative and no distress Neurologic: intact Heart: regular rate and rhythm Lungs: diminished breath sounds left base no air leak, serosanguinous drainage from CT  Lab Results:  Recent Labs  07/01/15 0254 07/02/15 0521  WBC 12.8* 14.8*  HGB 12.6 11.1*  HCT 38.3 35.4*  PLT 295 224   BMET:  Recent Labs  06/30/15 1812 07/02/15 0521  NA 136 139  K 3.5 3.6  CL 102 101  CO2 23 29  GLUCOSE 170* 110*  BUN 12 6  CREATININE 0.76 0.68  CALCIUM 9.2 8.3*    PT/INR:  Recent Labs  06/30/15 1812  LABPROT 14.4  INR 1.10   ABG    Component Value Date/Time   PHART 7.433 07/02/2015 0400   HCO3 28.3* 07/02/2015 0400   TCO2 29.6 07/02/2015 0400   ACIDBASEDEF 0.8 07/01/2015 0540   O2SAT 98.6 07/02/2015 0400   CBG (last 3)   Recent Labs  06/30/15 0623 07/01/15 0613  GLUCAP 81 101*    Assessment/Plan: S/P Procedure(s) (LRB): VIDEO ASSISTED THORACOSCOPY (VATS)/DECORTICATION (Left) POD # 1  CV- stable  RESP- s/p decortication. Overall CXR appearance acceptable. Still has some LLL atelectasis-  continue IS add flutter valve. Keep all CT in and on suction today. May be able to get anterior tube out tomorrow. Would leave remaining drains until drainage decreases  RENAL- creatinine Ok  ENDO- CBG well controlled  RA- continue plaquenil and resume cell cept  Ambulate  SCD + enoxaparin for DVT prophylaxis   LOS: 1 day    Melrose Nakayama 07/02/2015

## 2015-07-02 NOTE — Progress Notes (Signed)
AM labs unsuccessful due to no blood return from A-line.  Flush and repositioning attempted, but appears to have clotted off.  VSS and A-line not correlating with cuff pressures.  A-line removed due to malposition.  Will continue to monitor.

## 2015-07-02 NOTE — Progress Notes (Signed)
RT instructed patient of use of flutter valve. Patient was able to demonstrate back goof effort.

## 2015-07-02 NOTE — Op Note (Signed)
NAMEMarland [name]  Jamie, [name] NO.:  [id number]  MEDICAL RECORD NO.:  [id number]  LOCATION:  [id number]                        FACILITY:  [facility]  PHYSICIAN:  Revonda Standard. Roxan Hockey, M.D.DATE OF BIRTH:  06-07-1965  DATE OF PROCEDURE:  07/01/2015 DATE OF DISCHARGE:                              OPERATIVE REPORT   PREOPERATIVE DIAGNOSIS:  Loculated left pleural effusion.  POSTOPERATIVE DIAGNOSIS:  Clotted left hemothorax/fibrothorax.  PROCEDURE:   Left video-assisted thoracoscopy, Drainage of clotted hemothorax, Decortication.  SURGEON:  Revonda Standard. Roxan Hockey, M.D.  ASSISTANT:  John Giovanni, P.A.-C.  ANESTHESIA:  General.  FINDINGS:   Clotted hemothorax.  Minimal free bloody fluid.  Thick visceral pleural peel, particularly posteriorly.  Good re-expansion of the lung post decortication post decortication.  CLINICAL NOTE:  Jamie [name] is a 50 year old woman, who has a loculated pleural effusion.  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 She was scheduled to be seen as an outpatient regarding a left pleural effusion. But she presented after a syncopal spell at work. Workup did not reveal a specific cause for the syncope.  She was felt to be dehydrated and possibly have had a vasovagal episode.  She wished to have treatment for left pleural effusion while in the hospital.  The indications, risks, benefits, and alternatives were discussed in detail with the patient.  She understood and accepted the risks and agreed to proceed.  OPERATIVE NOTE:  [name] was brought to the preoperative holding area on Jul 01, 2015.  Anesthesia placed a central line and arterial blood pressure monitoring line.  She was taken to the operating room, anesthetized, and intubated.  A Foley catheter was placed.  Sequential compressive devices were placed on the calves for DVT prophylaxis. Intravenous antibiotics were administered.  She was placed in a  right lateral decubitus position and the left chest was prepped and draped in usual sterile fashion.  Single lung ventilation of the right lung was initiated and was tolerated well throughout the procedure.    An incision was made through the patient's prior chest tube site. It was carried through the skin and subcutaneous tissue.  The dissection was carried down to the intercostal muscles.  After entering the chest, a sucker was placed in the chest and small amount of bloody fluid was evacuated. Part of this was sent for bacterial, fungal and AFB cultures.  The remainder was sent for cytology.  A port was placed through the incision and the scope was advanced.  There was obvious clotted hemothorax.  The scope was removed.  Blunt finger dissection was used to free up the area around the incision.  This was difficult due to the relatively narrow rib interspaces.  Decision was made to the lengthen this incision.  This allowed evacuation of the left of the hemothorax, so that a separate incision could be made 2 interspaces below for placement of the 5 mm trocar and thoracoscope.  After placement, the clotted hemothorax was evacuated.  The lung was encased in dense fibrous peel.  There also was a fibrous peel on the parietal pleura.  Removal of clotted hemothorax was a slow and tedious process.  It was evident that a decortication would be necessary to get the lung to re-expand.  The decortication was started on the lower lobe.  It was very difficult to get a plane established initially.  Once the plane was established, the lower lobe, fissure, and upper lobe were decorticated.  A 2nd incision was made just anterior to the port incision.  This was used to place retractors to give countertraction on the lung during the decortication.  The peel was thick in all areas but particularly thick posteriorly and inferiorly. The dissection here took a considerable amount of time.  There was  some bleeding from the peel, but relatively minimal disruption of the visceral pleura.  The chest was copiously irrigated with warm saline on multiple occasions to clear any residual blood and clots.  The lung was periodically inflated to assess the efficacy of the decortication. After completing the decortication of the upper lobe, the chest was copiously irrigated with 1 L of saline.  A test inflation showed good reexpansion (approximately 95%).  There were no obvious areas of restriction.  A 28- [place] Blake drain was placed through the original port incision and directed posteriorly and superiorly.  A 28-French Blake drain was placed through the second port incision and directed along the diaphragmatic surface and finally a chest tube was placed through a separate stab incision and directed anteriorly.  Dual lung ventilation was resumed. The working incision was closed with a #1 Vicryl fascial suture, 2-0 Vicryl subcuticular.  Skin and subcutaneous tissue were closed in standard fashion.  The chest tubes were placed to suction.  The patient was placed back in a supine position.  She was extubated in the operating room and taken the postanesthetic care unit in good condition. All sponge, needle, and instrument counts were correct at the end of the procedure.     Revonda Standard Roxan Hockey, M.D.     SCH/MEDQ  D:  07/01/2015  T:  07/02/2015  Job:  [id number]

## 2015-07-03 ENCOUNTER — Inpatient Hospital Stay (HOSPITAL_COMMUNITY): Payer: Commercial Managed Care - HMO

## 2015-07-03 LAB — COMPREHENSIVE METABOLIC PANEL
ALK PHOS: 85 U/L (ref 38–126)
ALT: 11 U/L — ABNORMAL LOW (ref 14–54)
ANION GAP: 11 (ref 5–15)
AST: 15 U/L (ref 15–41)
Albumin: 2.7 g/dL — ABNORMAL LOW (ref 3.5–5.0)
BUN: 5 mg/dL — AB (ref 6–20)
CALCIUM: 8.7 mg/dL — AB (ref 8.9–10.3)
CHLORIDE: 103 mmol/L (ref 101–111)
CO2: 26 mmol/L (ref 22–32)
CREATININE: 0.56 mg/dL (ref 0.44–1.00)
Glucose, Bld: 103 mg/dL — ABNORMAL HIGH (ref 65–99)
Potassium: 3.6 mmol/L (ref 3.5–5.1)
SODIUM: 140 mmol/L (ref 135–145)
Total Bilirubin: 0.2 mg/dL — ABNORMAL LOW (ref 0.3–1.2)
Total Protein: 6.3 g/dL — ABNORMAL LOW (ref 6.5–8.1)

## 2015-07-03 LAB — CBC
HCT: 35 % — ABNORMAL LOW (ref 36.0–46.0)
Hemoglobin: 11 g/dL — ABNORMAL LOW (ref 12.0–15.0)
MCH: 28 pg (ref 26.0–34.0)
MCHC: 31.4 g/dL (ref 30.0–36.0)
MCV: 89.1 fL (ref 78.0–100.0)
PLATELETS: 233 10*3/uL (ref 150–400)
RBC: 3.93 MIL/uL (ref 3.87–5.11)
RDW: 13.7 % (ref 11.5–15.5)
WBC: 14.2 10*3/uL — ABNORMAL HIGH (ref 4.0–10.5)

## 2015-07-03 NOTE — Progress Notes (Signed)
Most anterior chest tube was removed by Merlene Laughter, RN.  Patient tolerated well.

## 2015-07-03 NOTE — Progress Notes (Addendum)
[address]       [name] [address]             [phone]      2 Days Post-Op Procedure(s) (LRB): VIDEO ASSISTED THORACOSCOPY (VATS)/DECORTICATION (Left) Subjective: Feels fairly well, some soreness  Objective: Vital signs in last 24 hours: Temp:  [97.5 F (36.4 C)-98.3 F (36.8 C)] 98.1 F (36.7 C) (05/20 0708) Pulse Rate:  [80-103] 86 (05/20 0708) Cardiac Rhythm:  [-] Normal sinus rhythm (05/20 0845) Resp:  [13-31] 20 (05/20 0800) BP: (125-136)/(86-97) 130/97 mmHg (05/20 0708) SpO2:  [96 %-100 %] 100 % (05/20 0800) FiO2 (%):  [21 %] 21 % (05/20 0800)  Hemodynamic parameters for last 24 hours:    Intake/Output from previous day: 05/19 0701 - 05/20 0700 In: 1529.2 [P.O.:480; I.V.:1049.2] Out: 1940 [Urine:1700; Chest Tube:240] Intake/Output this shift: Total I/O In: 360 [P.O.:360] Out: 450 [Urine:450]  General appearance: alert, cooperative and no distress Heart: regular rate and rhythm Lungs: dim right base Abdomen: benign Extremities: no edema Wound: incis healing well  Lab Results:  Recent Labs  07/02/15 0521 07/03/15 0347  WBC 14.8* 14.2*  HGB 11.1* 11.0*  HCT 35.4* 35.0*  PLT 224 233   BMET:  Recent Labs  07/02/15 0521 07/03/15 0347  NA 139 140  K 3.6 3.6  CL 101 103  CO2 29 26  GLUCOSE 110* 103*  BUN 6 5*  CREATININE 0.68 0.56  CALCIUM 8.3* 8.7*    PT/INR:  Recent Labs  06/30/15 1812  LABPROT 14.4  INR 1.10   ABG    Component Value Date/Time   PHART 7.433 07/02/2015 0400   HCO3 28.3* 07/02/2015 0400   TCO2 29.6 07/02/2015 0400   ACIDBASEDEF 0.8 07/01/2015 0540   O2SAT 98.6 07/02/2015 0400   CBG (last 3)   Recent Labs  07/01/15 0613  GLUCAP 101*    Meds Scheduled Meds: . acetaminophen  1,000 mg Oral Q6H   Or  . acetaminophen (TYLENOL) oral liquid 160 mg/5 mL  1,000 mg Oral Q6H  . amLODipine  10 mg Oral Daily  . bisacodyl  10 mg Oral Daily  . enoxaparin (LOVENOX) injection  40 mg  Subcutaneous Q24H  . fentaNYL   Intravenous Q4H  . hydroxychloroquine  400 mg Oral QODAY   And  . hydroxychloroquine  300 mg Oral QODAY  . mycophenolate  1,000 mg Oral BID  . predniSONE  20 mg Oral BID WC  . senna-docusate  1 tablet Oral QHS   Continuous Infusions: . dextrose 5 % and 0.9% NaCl 50 mL/hr at 07/02/15 2010   PRN Meds:.diphenhydrAMINE **OR** diphenhydrAMINE, gi cocktail, naloxone **AND** sodium chloride flush, ondansetron (ZOFRAN) IV, ondansetron (ZOFRAN) IV, oxyCODONE, potassium chloride  Xrays Dg Chest Port 1 View  07/02/2015  CLINICAL DATA:  Empyema. EXAM: PORTABLE CHEST 1 VIEW COMPARISON:  07/01/2015. FINDINGS: Small caliber catheter noted over the right upper chest this may be extrinsic to the patient. If this is a right IJ line, its tip is projected over the superior vena cava . Left chest tubes in stable position. Stable left pleural thickening. Multifocal bilateral pulmonary infiltrates unchanged. Stable left pleural thickening and/or effusion. Small right pleural effusion. IMPRESSION: 1. Left chest tubes in stable position with stable left pleural thickening and/or effusion. No pneumothorax. 2. Multifocal bilateral infiltrates unchanged . Small right pleural effusion. Electronically Signed   By: Marcello Moores  Register   On: 07/02/2015 07:52   Dg Chest Port 1 [address]  07/01/2015  CLINICAL DATA:  Empyema EXAM: PORTABLE CHEST 1 VIEW COMPARISON:  06/28/2015 chest radiograph FINDINGS: Two left chest tubes are noted terminating over the mid left pleural space. Surgical sutures overlie the left upper lung. Stable cardiomediastinal silhouette with normal heart size. No pneumothorax. No right pleural effusion. Mild residual pleural thickening/ small pleural effusion on the left, significantly decreased. Stable patchy opacities throughout the periphery of both lungs. IMPRESSION: 1. No pneumothorax. 2. Mild residual pleural thickening/small pleural effusion on the left, significantly decreased .  3. Stable patchy opacities throughout the periphery of both lungs, which could represent infection, atelectasis and/or interstitial lung disease due to the patient's rheumatoid arthritis. Electronically Signed   By: Ilona Sorrel M.D.   On: 07/01/2015 14:48    Chest tube- no air leak, 190 cc drainage out yesterday  Assessment/Plan: S/P Procedure(s) (LRB): VIDEO ASSISTED THORACOSCOPY (VATS)/DECORTICATION (Left)  1 doing well 2 d/c ant CT 3 hemodyn stable 4 labs stable 5 routine pulm toilet/rehab   LOS: 2 days    [name],[name] 07/03/2015   Chart reviewed, patient examined, agree with above. Anterior tube out today.

## 2015-07-04 ENCOUNTER — Inpatient Hospital Stay (HOSPITAL_COMMUNITY): Payer: Commercial Managed Care - HMO

## 2015-07-04 NOTE — Progress Notes (Addendum)
[address]       [place] [address]             [phone]      3 Days Post-Op Procedure(s) (LRB): VIDEO ASSISTED THORACOSCOPY (VATS)/DECORTICATION (Left) Subjective: She feels well  Objective: Vital signs in last 24 hours: Temp:  [97.5 F (36.4 C)-98.1 F (36.7 C)] 98 F (36.7 C) (05/21 0740) Pulse Rate:  [75-102] 81 (05/21 0740) Cardiac Rhythm:  [-] Normal sinus rhythm (05/21 0800) Resp:  [15-22] 16 (05/21 0740) BP: (127-142)/(89-98) 128/98 mmHg (05/21 0740) SpO2:  [98 %-100 %] 99 % (05/21 0740)  Hemodynamic parameters for last 24 hours:    Intake/Output from previous day: 05/20 0701 - 05/21 0700 In: 1150 [P.O.:600; I.V.:550] Out: 2380 [Urine:2200; Chest Tube:180] Intake/Output this shift: Total I/O In: -  Out: 450 [Urine:450]  General appearance: alert, cooperative and no distress Heart: regular rate and rhythm and tachy Lungs: clear to auscultation bilaterally Abdomen: benign Extremities: no edema Wound: incis healing well  Lab Results:  Recent Labs  07/02/15 0521 07/03/15 0347  WBC 14.8* 14.2*  HGB 11.1* 11.0*  HCT 35.4* 35.0*  PLT 224 233   BMET:  Recent Labs  07/02/15 0521 07/03/15 0347  NA 139 140  K 3.6 3.6  CL 101 103  CO2 29 26  GLUCOSE 110* 103*  BUN 6 5*  CREATININE 0.68 0.56  CALCIUM 8.3* 8.7*    PT/INR: No results for input(s): LABPROT, INR in the last 72 hours. ABG    Component Value Date/Time   PHART 7.433 07/02/2015 0400   HCO3 28.3* 07/02/2015 0400   TCO2 29.6 07/02/2015 0400   ACIDBASEDEF 0.8 07/01/2015 0540   O2SAT 98.6 07/02/2015 0400   CBG (last 3)  No results for input(s): GLUCAP in the last 72 hours.  Meds Scheduled Meds: . acetaminophen  1,000 mg Oral Q6H   Or  . acetaminophen (TYLENOL) oral liquid 160 mg/5 mL  1,000 mg Oral Q6H  . amLODipine  10 mg Oral Daily  . bisacodyl  10 mg Oral Daily  . enoxaparin (LOVENOX) injection  40 mg Subcutaneous Q24H  . fentaNYL   Intravenous  Q4H  . hydroxychloroquine  400 mg Oral QODAY   And  . hydroxychloroquine  300 mg Oral QODAY  . mycophenolate  1,000 mg Oral BID  . predniSONE  20 mg Oral BID WC  . senna-docusate  1 tablet Oral QHS   Continuous Infusions: . dextrose 5 % and 0.9% NaCl 50 mL/hr (07/03/15 1524)   PRN Meds:.diphenhydrAMINE **OR** diphenhydrAMINE, gi cocktail, naloxone **AND** sodium chloride flush, ondansetron (ZOFRAN) IV, ondansetron (ZOFRAN) IV, oxyCODONE, potassium chloride  Xrays Dg Chest Port 1 View  07/03/2015  CLINICAL DATA:  Pleural effusion EXAM: PORTABLE CHEST 1 VIEW COMPARISON:  07/02/2015 chest radiograph. FINDINGS: Two left chest tubes are stable in position with tips overlying the left mid pleural space. Surgical sutures overlie the mid to upper left lung. Stable cardiomediastinal silhouette with normal heart size. No pneumothorax. Stable mild pleural thickening/ small loculated pleural effusion in the peripheral basilar left pleural space. No right pleural effusion. No pulmonary edema. Mild bibasilar lung opacities, left greater than right, decreased bilaterally. IMPRESSION: 1. Stable mild pleural thickening/small loculated pleural effusion in the peripheral basilar left pleural space. No pneumothorax. 2. Mild bibasilar lung opacities, left greater than right, decreased bilaterally. Electronically Signed   By: Ilona Sorrel M.D.   On: 07/03/2015 11:15    CXR is stable, minor  drainage Chest tube no air leak Assessment/Plan: S/P Procedure(s) (LRB): VIDEO ASSISTED THORACOSCOPY (VATS)/DECORTICATION (Left)  1 doing very well- d/c one tube today and probably other in am  LOS: 3 days    GOLD,WAYNE E 07/04/2015   Chart reviewed, patient examined, agree with above. Chest tube output low and CXR looks good.

## 2015-07-04 NOTE — Progress Notes (Signed)
Anterior chest tube DC'd per MD order. Patient tolerated procedure well. Will continue to monitor.

## 2015-07-05 ENCOUNTER — Inpatient Hospital Stay (HOSPITAL_COMMUNITY): Payer: Commercial Managed Care - HMO

## 2015-07-05 LAB — BODY FLUID CULTURE: Culture: NO GROWTH

## 2015-07-05 LAB — TISSUE CULTURE
Culture: NO GROWTH
Gram Stain: NONE SEEN

## 2015-07-05 MED ORDER — FLUCONAZOLE 150 MG PO TABS
150.0000 mg | ORAL_TABLET | Freq: Once | ORAL | Status: AC
  Administered 2015-07-05: 150 mg via ORAL
  Filled 2015-07-05: qty 1

## 2015-07-05 MED ORDER — PREDNISONE 10 MG PO TABS
10.0000 mg | ORAL_TABLET | Freq: Two times a day (BID) | ORAL | Status: DC
  Administered 2015-07-05 – 2015-07-06 (×2): 10 mg via ORAL
  Filled 2015-07-05 (×2): qty 1

## 2015-07-05 NOTE — Progress Notes (Addendum)
Remaining CT removed per order. Patient tolerated procedure well. No adverse events noted. Will cont to monitor.  Fentanyl PCA discontinued and wasted in pyxis. Witnessed by Vertis Kelch, RN.

## 2015-07-05 NOTE — Discharge Summary (Signed)
Physician Discharge Summary  Patient ID: Jamie [name] MRN: [id number] DOB/AGE: [birth year] 51 y.o.  Admit date: 06/28/2015 Discharge date: 07/06/2015  Admission Diagnoses: 1. Syncope, 2. Left Pleural effusion  Discharge Diagnoses:  1. Syncope 2. Left hemothorax Principal Problem:   Syncope Active Problems:   Essential hypertension   Rheumatoid vasculitis (HCC)   Pulmonary nodules/lesions, multiple   Pleural effusion, left   GERD (gastroesophageal reflux disease)   Pain in the chest   Chest pain   Empyema lung [facility])  Patient Active Problem List   Diagnosis Date Noted  . Empyema lung ([place]) 07/01/2015  . Syncope 06/28/2015  . GERD (gastroesophageal reflux disease) 06/28/2015  . Chest pain 06/28/2015  . Pain in the chest   . Pleural effusion, left 06/04/2015  . Allergic rhinitis 10/02/2014  . Sacral ulcer 03/13/2013  . Pneumomediastinum ([place]) 12/10/2012  . Follow-up examination, following unspecified surgery 11/11/2012  . Thrush 10/25/2012  . Pulmonary nodules/lesions, multiple 10/13/2012  . Rheumatoid vasculitis ([place]) 10/11/2012  . 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 Photosensitivity 04/17/2012  . Rash and nonspecific skin eruption 04/17/2012  . Pulmonary infiltrates 03/09/2012  . Rash 01/31/2012  . Arthralgia 01/31/2012  . Leg pain, anterior 06/16/2011  . Well adult exam 11/02/2010  . Polycythemia 11/02/2010  . Headache(784.0) 06/07/2010  . EAR PAIN 11/10/2009  . SEBACEOUS CYST, INFECTED 11/10/2009  . CONSTIPATION, CHRONIC 05/10/2009  . Cough 06/21/2007  . THYROID NODULE 04/24/2007  . Essential hypertension 11/08/2006   History of present illness: Jamie [name] is a 50 yo woman who was scheduled to see me as an outpatient in the near future but was admitted with a syncopal spell. She has a history of pulmonary disease and had a VATS biopsy in 2014 by Dr. Roxy Manns. Pathology was nonspecific. She has a history of rheumatoid vasculitis and is on chronic immunosuppression  including mycophenolate and prednisone 4 mg daily.  She complains of 6 week history of a dry cough, left sided pleuritic chest pain, poor appetite and weight loss. No fever. No shortness of breath. She is a nonsmoker. No history of chest trauma. She had a CXR in 2015 well after her biopsy and did not have an effusion then. She saw Dr. Melvyn Novas. A CT chest showed a left pleural effusion. In late April a ultrasound directed thoracentesis removed 1 cc of bloody fluid which was culture negative, cytology negative.   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 On the day of admission she walked into a patient's room, became dizzy and "blacked out. " No prior history of syncope, arrhythmia, or stroke. She states she was probably dehydrated.  An echocardiogram showed normal biventricular function, no valvular insufficiency, no pericardial effusion.  She was admitted for elective video-assisted thoracoscopy for drainage of effusion and decortication.  Discharged Condition: good  Hospital Course: The patient was admitted electively and taken to the operating room on 07/01/2015 where she underwent of below described procedure. She tolerated it well and was taken to the postanesthesia care unit in good condition.  Postoperatively she has progressed nicely. She has maintained stable hemodynamics. All routine lines, monitors and drainage devices have been discontinued in the usual stepwise manner with serial chest x-ray evaluation for the chest tubes. She is tolerating gradually increasing activities using standard protocols. Oxygen has been weaned. Incision is healing well without evidence of infection. She is tolerating diet. She has a mild expected postop anemia which is stable. Pathology revealed inflammation. She is quite stable for discharge  Consults:  None  Significant Diagnostic Studies: routine post-op labs/ serial CXR  Treatments: surgery:   DATE OF PROCEDURE: 07/01/2015 DATE OF  DISCHARGE:   OPERATIVE REPORT   PREOPERATIVE DIAGNOSIS: Loculated left pleural effusion.  POSTOPERATIVE DIAGNOSIS: Clotted left hemothorax/fibrothorax.  PROCEDURE: Left video-assisted thoracoscopy, drainage of clotted hemothorax, and decortication.  SURGEON: Revonda Standard. Roxan Hockey, M.D.  ASSISTANT: John Giovanni, P.A.-C.  ANESTHESIA: General.  FINDINGS: Clotted hemothorax, minimal free bloody fluid, thick visceral pleural peel particularly posteriorly, good re-expansion of the lung post decortication.  Discharge Exam: Blood pressure 135/84, pulse 97, temperature 98 F (36.7 C), temperature source Oral, resp. rate 14, height 5\' 7"  (1.702 m), weight 149 lb 14.4 oz (67.994 kg), SpO2 98 %.  General appearance: alert, cooperative and no distress Heart: regular rate and rhythm Lungs: sl dim left base Abdomen: benign Extremities: no edema Wound: incis healing well   Disposition: 01-Home or Self Care     Medication List    STOP taking these medications        promethazine-codeine 6.25-10 MG/5ML syrup  Commonly known as:  PHENERGAN with CODEINE      TAKE these medications        amLODipine 10 MG tablet  Commonly known as:  NORVASC  Take 1 tablet by mouth daily.     CELLCEPT 500 MG tablet  Generic drug:  mycophenolate  Take 2 tablets by mouth 2 (two) times daily.     famotidine 20 MG tablet  Commonly known as:  PEPCID  One at bedtime     hydroxychloroquine 200 MG tablet  Commonly known as:  PLAQUENIL  Take 300-400 mg by mouth every other day. Alternating between 300mg  and 400mg  daily.     oxyCODONE 5 MG immediate release tablet  Commonly known as:  Oxy IR/ROXICODONE  Take 1-2 tablets (5-10 mg total) by mouth every 6 (six) hours as needed for severe pain.     pantoprazole 40 MG tablet  Commonly known as:  PROTONIX  Take 1 tablet (40 mg total) by mouth daily. Take 30-60 min before first meal of the day      potassium chloride SA 20 MEQ tablet  Commonly known as:  K-DUR,KLOR-CON  Take 1 tablet (20 mEq total) by mouth daily.     predniSONE 1 MG tablet  Commonly known as:  DELTASONE  Take 4 mg by mouth daily with breakfast.       Follow-up Information    Follow up with Melrose Nakayama, MD.   Specialty:  Cardiothoracic Surgery   Why:  07/20/2015 at 11 am to see surgeon. Please obtain a CXR at 10:30 at [facility] imaging, located in the same office complex   Contact information:   [address] [place] [address] [phone]       Follow up with [facility].   Specialty:  Cardiothoracic Surgery   Why:  07/13/2015 at 10:30 for suture removal   Contact information:   [place], [place] [phone]      Signed: John Giovanni 07/06/2015, 8:41 AM

## 2015-07-05 NOTE — Progress Notes (Addendum)
[address]       [place] [address]             [phone]      4 Days Post-Op Procedure(s) (LRB): VIDEO ASSISTED THORACOSCOPY (VATS)/DECORTICATION (Left) Subjective: Feels well  Objective: Vital signs in last 24 hours: Temp:  [97.9 F (36.6 C)-98.2 F (36.8 C)] 98 F (36.7 C) (05/22 0743) Pulse Rate:  [74-116] 74 (05/22 0743) Cardiac Rhythm:  [-] Normal sinus rhythm (05/22 0800) Resp:  [14-27] 21 (05/22 0743) BP: (119-137)/(77-97) 119/77 mmHg (05/22 0743) SpO2:  [97 %-100 %] 99 % (05/22 0743)  Hemodynamic parameters for last 24 hours:    Intake/Output from previous day: 05/21 0701 - 05/22 0700 In: 2450 [P.O.:600; I.V.:1850] Out: [id number] [Urine:1750; Chest Tube:40] Intake/Output this shift: Total I/O In: 170 [P.O.:120; I.V.:50] Out: 37 [Urine:800; Chest Tube:20]  General appearance: alert, cooperative and no distress Heart: regular rate and rhythm Lungs: sl dim left base Abdomen: benign Extremities: no edema Wound: incis healing well  Lab Results:  Recent Labs  07/03/15 0347  WBC 14.2*  HGB 11.0*  HCT 35.0*  PLT 233   BMET:  Recent Labs  07/03/15 0347  NA 140  K 3.6  CL 103  CO2 26  GLUCOSE 103*  BUN 5*  CREATININE 0.56  CALCIUM 8.7*    PT/INR: No results for input(s): LABPROT, INR in the last 72 hours. ABG    Component Value Date/Time   PHART 7.433 07/02/2015 0400   HCO3 28.3* 07/02/2015 0400   TCO2 29.6 07/02/2015 0400   ACIDBASEDEF 0.8 07/01/2015 0540   O2SAT 98.6 07/02/2015 0400   CBG (last 3)  No results for input(s): GLUCAP in the last 72 hours.  Meds Scheduled Meds: . acetaminophen  1,000 mg Oral Q6H   Or  . acetaminophen (TYLENOL) oral liquid 160 mg/5 mL  1,000 mg Oral Q6H  . amLODipine  10 mg Oral Daily  . bisacodyl  10 mg Oral Daily  . enoxaparin (LOVENOX) injection  40 mg Subcutaneous Q24H  . fentaNYL   Intravenous Q4H  . hydroxychloroquine  400 mg Oral QODAY   And  . hydroxychloroquine  300  mg Oral QODAY  . mycophenolate  1,000 mg Oral BID  . predniSONE  20 mg Oral BID WC  . senna-docusate  1 tablet Oral QHS   Continuous Infusions: . dextrose 5 % and 0.9% NaCl 50 mL/hr at 07/05/15 0700   PRN Meds:.diphenhydrAMINE **OR** diphenhydrAMINE, gi cocktail, naloxone **AND** sodium chloride flush, ondansetron (ZOFRAN) IV, ondansetron (ZOFRAN) IV, oxyCODONE, potassium chloride  Xrays Dg Chest Port 1 View  07/04/2015  CLINICAL DATA:  Status post VATS EXAM: PORTABLE CHEST 1 VIEW COMPARISON:  Jul 03, 2015 FINDINGS: Mild atelectasis in the lateral right lung base. Mild opacity in the periphery of the right lung is stable. The right lung is otherwise unchanged. The small left effusion remains. There is adjacent underlying mild opacity in the lung. These findings are likely from the recent surgery. The most lateral of the 2 previously seen chest tubes has been removed. The more medial tube remains. No pneumothorax. No change in the cardiomediastinal silhouette. IMPRESSION: 1. Removal of 1 of the 2 left-sided chest tubes with no pneumothorax. A small left pleural effusion remains with adjacent opacity, likely atelectasis. No other changes. Electronically Signed   By: Dorise Bullion III M.D   On: 07/04/2015 11:01    Assessment/Plan: S/P Procedure(s) (LRB): VIDEO ASSISTED THORACOSCOPY (VATS)/DECORTICATION (Left)  1  conts to progress well 2 check CXR and poss d/c last tube 3 cont pulm toilet and rehab   LOS: 4 days    Jamie [name],Jamie [name] 07/05/2015  Patient seen and examined, agree with above Dc chest tube All cultures negative to date Path showed inflammation Possibly home in AM  Nason C. Roxan Hockey, MD [facility] [phone]

## 2015-07-05 NOTE — Discharge Instructions (Signed)
Thoracoscopy, Care After °Refer to this sheet in the next few weeks. These instructions provide you with information about caring for yourself after your procedure. Your health care provider may also give you more specific instructions. Your treatment has been planned according to current medical practices, but problems sometimes occur. Call your health care provider if you have any problems or questions after your procedure. °WHAT TO EXPECT AFTER THE PROCEDURE: °After your procedure, it is common to feel sore for up to two weeks. °HOME CARE INSTRUCTIONS °· There are many different ways to close and cover an incision, including stitches (sutures), skin glue, and adhesive strips. Follow your health care provider's instructions about: °¨ Incision care. °¨ Bandage (dressing) changes and removal. °¨ Incision closure removal. °· Check your incision area every day for signs of infection. Watch for: °¨ Redness, swelling, or pain. °¨ Fluid, blood, or pus. °· Take medicines only as directed by your health care provider. °· Try to cough often. Coughing helps to protect against lung infection (pneumonia). It may hurt to cough. If this happens, hold a pillow against your chest when you cough. °· Take deep breaths. This also helps to protect against pneumonia. °· If you were given an incentive spirometer, use it as directed by your health care provider. °· Do not take baths, swim, or use a hot tub until your health care provider approves. You may take showers. °· Avoid lifting until your health care provider approves. °· Avoid driving until your health care provider approves. °· Do not travel by airplane after the chest tube is removed until your health care provider approves. °SEEK MEDICAL CARE IF: °· You have a fever. °· Pain medicines do not ease your pain. °· You have redness, swelling, or increasing pain in your incision area. °· You develop a cough that does not go away, or you are coughing up mucus that is yellow or  green. °SEEK IMMEDIATE MEDICAL CARE IF: °· You have fluid, blood, or pus coming from your incision. °· There is a bad smell coming from your incision or dressing. °· You develop a rash. °· You have difficulty breathing. °· You cough up blood. °· You develop light-headedness or you feel faint. °· You develop chest pain. °· Your heartbeat feels irregular or very fast. °  °This information is not intended to replace advice given to you by your health care provider. Make sure you discuss any questions you have with your health care provider. °  °Document Released: 08/19/2004 Document Revised: 02/20/2014 Document Reviewed: 10/15/2013 °Elsevier Interactive Patient Education ©2016 Elsevier Inc. ° °

## 2015-07-06 ENCOUNTER — Inpatient Hospital Stay (HOSPITAL_COMMUNITY): Payer: Commercial Managed Care - HMO

## 2015-07-06 MED ORDER — OXYCODONE HCL 5 MG PO TABS: 5.0000 mg | ORAL_TABLET | Freq: Four times a day (QID) | ORAL | Status: DC | PRN

## 2015-07-06 NOTE — Progress Notes (Signed)
Patient discharged instructions reviewed with patient. Prescription given to patient. Follow up appointment reviewed. Patient aware of two separate appointments. S/s of infection reviewed and patient aware of what to do/call.  VSS. Pt in no acute distress. PIV removed. Discharged via wheelchair.

## 2015-07-06 NOTE — Care Management Note (Signed)
Case Management Note  Patient Details  Name: Jamie [name] MRN: [id number] Date of Birth: [birth year]  Subjective/Objective:  Patient is from home ,pta indep, s/p vats, discharged, no needs.                  Action/Plan:   Expected Discharge Date:                  Expected Discharge Plan:  Home/Self Care  In-House Referral:     Discharge planning Services  CM Consult  Post Acute Care Choice:    Choice offered to:     DME Arranged:    DME Agency:     HH Arranged:    [place] Agency:     Status of Service:  Completed, signed off  Medicare Important Message Given:    Date Medicare IM Given:    Medicare IM give by:    Date Additional Medicare IM Given:    Additional Medicare Important Message give by:     If discussed at [place] of Stay Meetings, dates discussed:    Additional Comments:  Zenon Mayo, RN 07/06/2015, 2:53 PM

## 2015-07-06 NOTE — Progress Notes (Signed)
5 Days Post-Op Procedure(s) (LRB): VIDEO ASSISTED THORACOSCOPY (VATS)/DECORTICATION (Left) Subjective: No complaints, anxious to go home  Objective: Vital signs in last 24 hours: Temp:  [97.9 F (36.6 C)-98.5 F (36.9 C)] 98 F (36.7 C) (05/23 0746) Pulse Rate:  [77-102] 97 (05/23 0746) Cardiac Rhythm:  [-] Normal sinus rhythm (05/23 0746) Resp:  [12-27] 14 (05/23 0746) BP: (125-142)/(84-99) 135/84 mmHg (05/23 0746) SpO2:  [98 %-100 %] 98 % (05/23 0746)  Hemodynamic parameters for last 24 hours:    Intake/Output from previous day: 05/22 0701 - 05/23 0700 In: 1618 [P.O.:1440; I.V.:178] Out: 2070 [Urine:2050; Chest Tube:20] Intake/Output this shift: Total I/O In: 120 [P.O.:120] Out: -   General appearance: alert, cooperative and no distress Neurologic: intact Heart: regular rate and rhythm Lungs: diminished breath sounds left base Wound: clean and dry  Lab Results: No results for input(s): WBC, HGB, HCT, PLT in the last 72 hours. BMET: No results for input(s): NA, K, CL, CO2, GLUCOSE, BUN, CREATININE, CALCIUM in the last 72 hours.  PT/INR: No results for input(s): LABPROT, INR in the last 72 hours. ABG    Component Value Date/Time   PHART 7.433 07/02/2015 0400   HCO3 28.3* 07/02/2015 0400   TCO2 29.6 07/02/2015 0400   ACIDBASEDEF 0.8 07/01/2015 0540   O2SAT 98.6 07/02/2015 0400   CBG (last 3)  No results for input(s): GLUCAP in the last 72 hours.  Assessment/Plan: S/P Procedure(s) (LRB): VIDEO ASSISTED THORACOSCOPY (VATS)/DECORTICATION (Left) Plan for discharge: see discharge orders   Doing well Path showed fibrosis, no malignancy Cultures negative Instructed not to drive until after I see her in the office   LOS: 5 days    Melrose Nakayama 07/06/2015

## 2015-07-07 LAB — ANAEROBIC CULTURE

## 2015-07-09 ENCOUNTER — Other Ambulatory Visit: Payer: Self-pay

## 2015-07-09 DIAGNOSIS — R110 Nausea: Secondary | ICD-10-CM

## 2015-07-09 MED ORDER — PROMETHAZINE HCL 12.5 MG PO TABS: 12.5000 mg | ORAL_TABLET | Freq: Four times a day (QID) | ORAL | Status: DC | PRN

## 2015-07-13 ENCOUNTER — Ambulatory Visit (INDEPENDENT_AMBULATORY_CARE_PROVIDER_SITE_OTHER): Payer: Self-pay | Admitting: *Deleted

## 2015-07-13 DIAGNOSIS — J90 Pleural effusion, not elsewhere classified: Secondary | ICD-10-CM

## 2015-07-13 DIAGNOSIS — Z4802 Encounter for removal of sutures: Secondary | ICD-10-CM

## 2015-07-13 DIAGNOSIS — Z736 Limitation of activities due to disability: Secondary | ICD-10-CM

## 2015-07-13 DIAGNOSIS — J948 Other specified pleural conditions: Secondary | ICD-10-CM

## 2015-07-13 DIAGNOSIS — Z09 Encounter for follow-up examination after completed treatment for conditions other than malignant neoplasm: Secondary | ICD-10-CM

## 2015-07-13 NOTE — Progress Notes (Signed)
Ms. Rolf returns s/p thoracic surgery for a left loculated pleural effusion. Three sutures were easily removed from her previous chest tube sites and are healing well. The left mini thoracotomy incision is well healed with residual glue intact. She is doing well and not requiring narcotics for pain. She will return as scheduled with a CXR.

## 2015-07-20 ENCOUNTER — Ambulatory Visit: Payer: Commercial Managed Care - HMO | Admitting: Thoracic Surgery (Cardiothoracic Vascular Surgery)

## 2015-07-26 ENCOUNTER — Other Ambulatory Visit: Payer: Self-pay | Admitting: Thoracic Surgery (Cardiothoracic Vascular Surgery)

## 2015-07-26 DIAGNOSIS — J869 Pyothorax without fistula: Secondary | ICD-10-CM

## 2015-07-27 ENCOUNTER — Encounter: Payer: Self-pay | Admitting: Thoracic Surgery (Cardiothoracic Vascular Surgery)

## 2015-07-27 ENCOUNTER — Ambulatory Visit (INDEPENDENT_AMBULATORY_CARE_PROVIDER_SITE_OTHER): Payer: Self-pay | Admitting: Thoracic Surgery (Cardiothoracic Vascular Surgery)

## 2015-07-27 ENCOUNTER — Ambulatory Visit
Admission: RE | Admit: 2015-07-27 | Discharge: 2015-07-27 | Disposition: A | Discharge status: Home / Self Care | Payer: Commercial Managed Care - HMO | Source: Ambulatory Visit | Attending: Thoracic Surgery (Cardiothoracic Vascular Surgery) | Admitting: Thoracic Surgery (Cardiothoracic Vascular Surgery)

## 2015-07-27 VITALS — BP 146/93 | HR 115 | Resp 16 | Ht 67.0 in | Wt 147.0 lb

## 2015-07-27 DIAGNOSIS — J90 Pleural effusion, not elsewhere classified: Secondary | ICD-10-CM

## 2015-07-27 DIAGNOSIS — J948 Other specified pleural conditions: Secondary | ICD-10-CM

## 2015-07-27 DIAGNOSIS — J869 Pyothorax without fistula: Secondary | ICD-10-CM

## 2015-07-27 DIAGNOSIS — Z09 Encounter for follow-up examination after completed treatment for conditions other than malignant neoplasm: Secondary | ICD-10-CM

## 2015-07-27 NOTE — Progress Notes (Signed)
[address]       [place] [address]             [phone]       HPI: Ms. Jamie [name] returns today for scheduled postoperative follow-up visit.  She is a 50 year old woman with a history of rheumatoid disease who had a left VATS for drainage of a clotted hemothorax/fibrothorax and decortication on 07/02/2015. She did well postoperatively and went home on day 4. Pathology showed inflammation.  She is feeling well. She's not having much incisional pain and is not requiring any narcotics. She does have some numbness in the right costal margin region. She is not having any shortness of breath or chest pain.  Past Medical History  Diagnosis Date  . Hypertension   . Multiple thyroid nodules   . History of cold sores   . 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 Photosensitivity 04/17/2012    3/14 new   . Pulmonary nodules/lesions, multiple 10/13/2012  . Pneumomediastinum ([place])     Pneumomediastinum in the setting of progressive nodular pulmonary infiltrates with dermatologic vasculitis/notes 12/10/2012  . Dysrhythmia     ST/notes 12/10/2012  . Pneumonia 02/2012    "once" (12/10/2012)  . 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 Rheumatoid arthritis ([name])     "legs; arms" (12/10/2012)  . Pneumomediastinum ([place]) 03/03/2013      Current Outpatient Prescriptions  Medication Sig Dispense Refill  . amLODipine (NORVASC) 10 MG tablet Take 1 tablet by mouth daily.    . famotidine (PEPCID) 20 MG tablet One at bedtime (Patient taking differently: Take 20 mg by mouth daily as needed for heartburn. One at bedtime) 30 tablet 2  . hydroxychloroquine (PLAQUENIL) 200 MG tablet Take 300-400 mg by mouth every other day. Alternating between 300mg  and 400mg  daily.    . mycophenolate (CELLCEPT) 500 MG tablet Take 2 tablets by mouth 2 (two) times daily.    . pantoprazole (PROTONIX) 40 MG tablet Take 1 tablet (40 mg total) by mouth daily. Take  30-60 min before first meal of the day (Patient taking differently: Take 40 mg by mouth daily as needed (heartburn). ) 30 tablet 2  . potassium chloride SA (K-DUR,KLOR-CON) 20 MEQ tablet Take 1 tablet (20 mEq total) by mouth daily. 30 tablet 0  . predniSONE (DELTASONE) 1 MG tablet Take 4 mg by mouth daily with breakfast.     No current facility-administered medications for this visit.    Physical Exam BP 146/93 mmHg  Pulse 115  Resp 16  Ht 5\' 7"  (1.702 m)  Wt 147 lb (66.679 kg)  BMI 23.02 kg/m2  SpO53 75% 50 year old woman in no acute distress Alert and oriented 3 with no focal deficits Cardiac regular rate and rhythm normal S1 and S2 Lungs slightly diminished at left base, otherwise clear Incisions healing well  Diagnostic Tests: I personally reviewed the chest x-ray. It shows postoperative changes with a good result post decortication.  Impression: 50 year old woman who is now about a month out from drainage of a clotted hemothorax and decortication. She is doing extremely well this early postop area  She is not requiring any narcotics.  She has been driving without issues.  She wants to return to work. She asked if she could go back on 08/02/2015. I don't have any objection to that. I did advise her that she may have some issues with the stamina and if so  she should contact our office.  Plan: I will be happy to see Ms. Jamie [name] back again at any time if I can be of any further assistance with her care.  Melrose Nakayama, MD [facility] [phone]

## 2015-07-29 LAB — FUNGUS CULTURE WITH STAIN

## 2015-07-29 LAB — FUNGUS CULTURE RESULT

## 2015-07-29 LAB — FUNGAL ORGANISM REFLEX

## 2015-08-13 LAB — ACID FAST CULTURE WITH REFLEXED SENSITIVITIES

## 2015-08-13 LAB — ACID FAST CULTURE WITH REFLEXED SENSITIVITIES (MYCOBACTERIA): Acid Fast Culture: NEGATIVE

## 2015-10-11 ENCOUNTER — Telehealth: Payer: Self-pay | Admitting: Internal Medicine

## 2015-10-11 MED ORDER — SERTRALINE HCL 50 MG PO TABS: 50.0000 mg | ORAL_TABLET | Freq: Every day | ORAL | 3 refills | Status: DC

## 2015-10-11 NOTE — Telephone Encounter (Signed)
Marriage issues discussed w/pt and her husband today Marriage concelling Zoloft  Rx emailed

## 2016-02-17 ENCOUNTER — Other Ambulatory Visit: Payer: Self-pay | Admitting: Obstetrics and Gynecology

## 2016-02-17 DIAGNOSIS — Z1231 Encounter for screening mammogram for malignant neoplasm of breast: Secondary | ICD-10-CM

## 2016-03-09 ENCOUNTER — Other Ambulatory Visit: Payer: Self-pay | Admitting: Internal Medicine

## 2016-03-14 ENCOUNTER — Ambulatory Visit
Admission: RE | Admit: 2016-03-14 | Discharge: 2016-03-14 | Disposition: A | Discharge status: Home / Self Care | Payer: Commercial Managed Care - HMO | Source: Ambulatory Visit | Attending: Obstetrics and Gynecology | Admitting: Obstetrics and Gynecology

## 2016-03-14 DIAGNOSIS — Z1231 Encounter for screening mammogram for malignant neoplasm of breast: Secondary | ICD-10-CM

## 2016-03-29 ENCOUNTER — Telehealth: Payer: Self-pay | Admitting: Emergency Medicine

## 2016-03-29 DIAGNOSIS — R059 Cough, unspecified: Secondary | ICD-10-CM

## 2016-03-29 DIAGNOSIS — R05 Cough: Secondary | ICD-10-CM

## 2016-03-29 NOTE — Telephone Encounter (Signed)
OK CXR °Thx °

## 2016-03-29 NOTE — Telephone Encounter (Signed)
Please advise, I will call patient back, thanks 

## 2016-03-29 NOTE — Telephone Encounter (Signed)
Pt called and stated she has a history of pneumonia and wants to know if she can come in and get a chest xray. She said Dr Camila Li has done this before without having to see her. Please advise thanks.

## 2016-03-30 ENCOUNTER — Ambulatory Visit (INDEPENDENT_AMBULATORY_CARE_PROVIDER_SITE_OTHER)
Admission: RE | Admit: 2016-03-30 | Discharge: 2016-03-30 | Disposition: A | Discharge status: Home / Self Care | Payer: Commercial Managed Care - HMO | Source: Ambulatory Visit | Attending: Internal Medicine | Admitting: Internal Medicine

## 2016-03-30 ENCOUNTER — Ambulatory Visit: Admission: RE | Admit: 2016-03-30 | Payer: Commercial Managed Care - HMO | Source: Ambulatory Visit

## 2016-03-30 DIAGNOSIS — R05 Cough: Secondary | ICD-10-CM

## 2016-03-30 DIAGNOSIS — R059 Cough, unspecified: Secondary | ICD-10-CM

## 2016-03-30 NOTE — Addendum Note (Signed)
Addended by: Aviva Signs M on: 03/30/2016 11:23 AM   Modules accepted: Orders

## 2016-03-30 NOTE — Telephone Encounter (Signed)
Left message advising patient ok for her to come in for xray downstairs in basement per dr plotnikovs note

## 2016-08-08 ENCOUNTER — Telehealth: Payer: Self-pay | Admitting: Internal Medicine

## 2016-08-08 NOTE — Telephone Encounter (Signed)
Pt is requesting a refill on valACYclovir (VALTREX) 500 MG tablet to be sent to [facility] in [place].

## 2016-08-09 MED ORDER — VALACYCLOVIR HCL 500 MG PO TABS: 500.0000 mg | ORAL_TABLET | Freq: Two times a day (BID) | ORAL | 0 refills | Status: DC

## 2016-08-09 NOTE — Telephone Encounter (Signed)
Refill has been sent also pt is due for annual appt msg has been left for patient...Jamie [name]

## 2016-08-29 MED FILL — ZYLET EYE DROPS: 0.5-0.3 | 12 days supply | Qty: 5 | Fill #0

## 2017-03-02 ENCOUNTER — Encounter: Payer: Self-pay | Admitting: Internal Medicine

## 2017-03-02 ENCOUNTER — Ambulatory Visit: Payer: Commercial Managed Care - HMO | Admitting: Internal Medicine

## 2017-03-02 DIAGNOSIS — M0520 Rheumatoid vasculitis with rheumatoid arthritis of unspecified site: Secondary | ICD-10-CM | POA: Diagnosis not present

## 2017-03-02 DIAGNOSIS — K529 Noninfective gastroenteritis and colitis, unspecified: Secondary | ICD-10-CM

## 2017-03-02 DIAGNOSIS — F419 Anxiety disorder, unspecified: Secondary | ICD-10-CM | POA: Insufficient documentation

## 2017-03-02 DIAGNOSIS — I10 Essential (primary) hypertension: Secondary | ICD-10-CM | POA: Diagnosis not present

## 2017-03-02 MED ORDER — ONDANSETRON HCL 4 MG PO TABS: 4.0000 mg | ORAL_TABLET | Freq: Three times a day (TID) | ORAL | 0 refills | Status: DC | PRN

## 2017-03-02 MED ORDER — SERTRALINE HCL 50 MG PO TABS: 50.0000 mg | ORAL_TABLET | Freq: Every day | ORAL | 11 refills | Status: DC

## 2017-03-02 MED ORDER — OLMESARTAN MEDOXOMIL 20 MG PO TABS: 20.0000 mg | ORAL_TABLET | Freq: Every day | ORAL | 11 refills | Status: DC

## 2017-03-02 NOTE — Progress Notes (Signed)
Subjective:  Patient ID: Jamie [name], female    DOB: 02/11/1966  Age: 52 y.o. MRN: [id number]  CC: No chief complaint on file.   HPI Jamie [name] presents for HTN, vasculitis/CTD (RA and amyopathic dermatomyositis), GERD f/u  C/o n/v/d x 1 d  Outpatient Medications Prior to Visit  Medication Sig Dispense Refill  . amLODipine (NORVASC) 10 MG tablet Take 1 tablet by mouth daily.    . hydroxychloroquine (PLAQUENIL) 200 MG tablet Take 300-400 mg by mouth every other day. Alternating between 300mg  and 400mg  daily.    . mycophenolate (CELLCEPT) 500 MG tablet Take 2 tablets by mouth 2 (two) times daily.    . predniSONE (DELTASONE) 1 MG tablet Take 4 mg by mouth daily with breakfast.    . sertraline (ZOLOFT) 50 MG tablet Take 1 tablet (50 mg total) by mouth daily. 30 tablet 3  . valACYclovir (VALTREX) 500 MG tablet Take 1 tablet (500 mg total) by mouth 2 (two) times daily. Overdue for annual appt must see MD for refills 14 tablet 0  . famotidine (PEPCID) 20 MG tablet One at bedtime (Patient taking differently: Take 20 mg by mouth daily as needed for heartburn. One at bedtime) 30 tablet 2  . pantoprazole (PROTONIX) 40 MG tablet Take 1 tablet (40 mg total) by mouth daily. Take 30-60 min before first meal of the day (Patient taking differently: Take 40 mg by mouth daily as needed (heartburn). ) 30 tablet 2  . potassium chloride SA (K-DUR,KLOR-CON) 20 MEQ tablet Take 1 tablet (20 mEq total) by mouth daily. 30 tablet 0   No facility-administered medications prior to visit.     ROS Review of Systems  Constitutional: Positive for fatigue. Negative for activity change, appetite change, chills and unexpected weight change.  HENT: Negative for congestion, mouth sores and sinus pressure.   Eyes: Negative for visual disturbance.  Respiratory: Positive for cough. Negative for chest tightness.   Gastrointestinal: Positive for diarrhea, nausea and vomiting. Negative for abdominal pain.    Genitourinary: Negative for difficulty urinating, frequency and vaginal pain.  Musculoskeletal: Negative for back pain and gait problem.  Skin: Positive for rash. Negative for pallor.  Neurological: Negative for dizziness, tremors, weakness, numbness and headaches.  Psychiatric/Behavioral: Negative for confusion and sleep disturbance.    Objective:  BP 134/86 (BP Location: Left Arm, Patient Position: Sitting, Cuff Size: Normal)   Pulse 88   Temp 97.6 F (36.4 C) (Oral)   Ht 5\' 7"  (1.702 m)   Wt 166 lb (75.3 kg)   SpO2 100%   BMI 26.00 kg/m   BP Readings from Last 3 Encounters:  03/02/17 134/86  07/27/15 (!) 146/93  07/06/15 135/84    Wt Readings from Last 3 Encounters:  03/02/17 166 lb (75.3 kg)  07/27/15 147 lb (66.7 kg)  07/01/15 149 lb 14.4 oz (68 kg)    Physical Exam  Constitutional: She appears well-developed. No distress.  HENT:  Head: Normocephalic.  Right Ear: External ear normal.  Left Ear: External ear normal.  Nose: Nose normal.  Mouth/Throat: Oropharynx is clear and moist.  Eyes: Conjunctivae are normal. Pupils are equal, round, and reactive to light. Right eye exhibits no discharge. Left eye exhibits no discharge.  Neck: Normal range of motion. Neck supple. No JVD present. No tracheal deviation present. No thyromegaly present.  Cardiovascular: Normal rate, regular rhythm and normal heart sounds.  Pulmonary/Chest: No stridor. No respiratory distress. She has no wheezes.  Abdominal: Soft. Bowel sounds are normal.  She exhibits no distension and no mass. There is no tenderness. There is no rebound and no guarding.  Musculoskeletal: She exhibits no edema or tenderness.  Lymphadenopathy:    She has no cervical adenopathy.  Neurological: She displays normal reflexes. No cranial nerve deficit. She exhibits normal muscle tone. Coordination normal.  Skin: No rash noted. No erythema.  Psychiatric: She has a normal mood and affect. Her behavior is normal. Judgment  and thought content normal.    Lab Results  Component Value Date   WBC 14.2 (H) 07/03/2015   HGB 11.0 (L) 07/03/2015   HCT 35.0 (L) 07/03/2015   PLT 233 07/03/2015   GLUCOSE 103 (H) 07/03/2015   CHOL 152 10/27/2010   TRIG 35.0 10/27/2010   HDL 50.80 10/27/2010   LDLCALC 94 10/27/2010   ALT 11 (L) 07/03/2015   AST 15 07/03/2015   NA 140 07/03/2015   K 3.6 07/03/2015   CL 103 07/03/2015   CREATININE 0.56 07/03/2015   BUN 5 (L) 07/03/2015   CO2 26 07/03/2015   TSH 0.659 06/28/2015   INR 1.10 06/30/2015   HGBA1C 5.9 (H) 10/14/2012    Dg Chest 2 View  Result Date: 03/30/2016 CLINICAL DATA:  Cough, shortness of breath. EXAM: CHEST  2 VIEW COMPARISON:  Radiographs of July 27, 2015.  CT scan of Jun 28, 2015. FINDINGS: Stable cardiomediastinal silhouette. Postsurgical changes are noted in the left lung apex. No pneumothorax is noted. Stable left basilar opacity is noted concerning for atelectasis or scarring with associated small loculated pleural effusion. Stable bilateral upper lobe densities are noted concerning for scarring. Bony thorax is unremarkable. Stable interstitial densities are noted throughout both lungs most consistent with scarring. Bony thorax is unremarkable. IMPRESSION: Stable bilateral scarring is noted, with stable left basilar atelectasis or scarring with associated small loculated pleural effusion. Acute superimposed inflammation or edema cannot be excluded. Electronically Signed   By: Marijo Conception, M.D.   On: 03/30/2016 13:54    Assessment & Plan:   There are no diagnoses linked to this encounter. I have discontinued Keylie L. Gordin's famotidine, pantoprazole, and potassium chloride SA. I am also having her maintain her hydroxychloroquine, mycophenolate, amLODipine, predniSONE, sertraline, and valACYclovir.  No orders of the defined types were placed in this encounter.    Follow-up: No Follow-up on file.  Walker Kehr, MD

## 2017-03-02 NOTE — Assessment & Plan Note (Signed)
Treated at [facility] 2014: vasculitis/CTD (RA and amyopathic dermatomyositis) Cellcept Plaquenil Prednisone 2 mg

## 2017-03-02 NOTE — Assessment & Plan Note (Addendum)
Start Benicar in 1-2 days Labs at [facility]

## 2017-03-02 NOTE — Assessment & Plan Note (Signed)
Zoloft

## 2017-03-02 NOTE — Assessment & Plan Note (Signed)
Zofran prn, Imodium

## 2017-03-07 ENCOUNTER — Other Ambulatory Visit: Payer: Self-pay | Admitting: Obstetrics and Gynecology

## 2017-03-07 DIAGNOSIS — Z1231 Encounter for screening mammogram for malignant neoplasm of breast: Secondary | ICD-10-CM

## 2017-03-16 ENCOUNTER — Ambulatory Visit
Admission: RE | Admit: 2017-03-16 | Discharge: 2017-03-16 | Disposition: A | Discharge status: Home / Self Care | Payer: Commercial Managed Care - HMO | Source: Ambulatory Visit | Attending: Obstetrics and Gynecology | Admitting: Obstetrics and Gynecology

## 2017-03-16 DIAGNOSIS — Z1231 Encounter for screening mammogram for malignant neoplasm of breast: Secondary | ICD-10-CM

## 2017-03-29 ENCOUNTER — Ambulatory Visit: Payer: Commercial Managed Care - HMO

## 2017-04-05 ENCOUNTER — Encounter: Payer: Self-pay | Admitting: Family Medicine

## 2017-04-05 ENCOUNTER — Ambulatory Visit: Payer: 59 | Admitting: Family Medicine

## 2017-04-05 ENCOUNTER — Ambulatory Visit: Payer: Self-pay | Admitting: *Deleted

## 2017-04-05 DIAGNOSIS — I10 Essential (primary) hypertension: Secondary | ICD-10-CM | POA: Diagnosis not present

## 2017-04-05 MED ORDER — VALACYCLOVIR HCL 500 MG PO TABS: 500.0000 mg | ORAL_TABLET | Freq: Two times a day (BID) | ORAL | 0 refills | Status: DC

## 2017-04-05 MED ORDER — AMLODIPINE BESYLATE 10 MG PO TABS: 10.0000 mg | ORAL_TABLET | Freq: Every day | ORAL | 0 refills | Status: DC

## 2017-04-05 NOTE — Telephone Encounter (Signed)
noted 

## 2017-04-05 NOTE — Patient Instructions (Signed)
Please try taking the amlodipine with the Benicar.  Please try to exercise at least 30 minutes regular basis.  Please follow-up with me for your primary doctor to make sure that your blood pressure is becoming under control.

## 2017-04-05 NOTE — Telephone Encounter (Signed)
pt called because her blood pressure medicine was changed and today her blood pressures were 0530 186/101 and 0830 188/100; yesterday it was 175/99 and 181/96; nurse triage initiated and recommendations made per protocol to include seeing physician within 24 hours; pt is normally seen by Dr Alain Marion , and requests to be seen after 12 noon todau, but no appointments available; pt offered and accepted appointment with Dr Clearance Coots at 1320 today; pt verbalizes understanding and would like to stop the benicar and go back to  norvasc and hctz; she also says that the medication was changed due to suggestions from rheumatologist. Reason for Disposition . Systolic BP  >= 374 OR Diastolic >= 827  Answer Assessment - Initial Assessment Questions 1. BLOOD PRESSURE: "What is the blood pressure?" "Did you take at least two measurements 5 minutes apart?"     186/101 left arm and 188/100 righ arm 2. ONSET: "When did you take your blood pressure?"     04/05/17 at 0530 and 0830 3. HOW: "How did you obtain the blood pressure?" (e.g., visiting nurse, automatic home BP monitor)     Cuff at work 4. HISTORY: "Do you have a history of high blood pressure?"     yes 5. MEDICATIONS: "Are you taking any medications for blood pressure?" "Have you missed any doses recently?"     no 6. OTHER SYMPTOMS: "Do you have any symptoms?" (e.g., headache, chest pain, blurred vision, difficulty breathing, weakness)     no 7. PREGNANCY: "Is there any chance you are pregnant?" "When was your last menstrual period?"     No  Protocols used: HIGH BLOOD PRESSURE-A-AH

## 2017-04-05 NOTE — Progress Notes (Signed)
TEKELIA KAREEM - 52 y.o. female MRN [id number]  Date of birth: [birth year]  SUBJECTIVE:  Including CC & ROS.  Chief Complaint  Patient presents with  . Hypertension    ANAMARIE HUNN is a 52 y.o. female that is presenting with hypertension. Patient states her blood pressure medication was changed on 03/02/17. She was taking Novasc and changed to Benicar. She states her blood pressure readings have been 180/100. Previously her readings have not been as elevated. She has been having a headache. Denies vision issues.     Review of Systems  Constitutional: Negative for fever.  Respiratory: Negative for shortness of breath.   Cardiovascular: Negative for chest pain and leg swelling.  Gastrointestinal: Negative for abdominal distention.  Musculoskeletal: Positive for arthralgias.  Skin: Negative for color change.  Neurological: Negative for weakness.  Hematological: Negative for adenopathy.  Psychiatric/Behavioral: Negative for agitation.    HISTORY: Past Medical, Surgical, Social, and Family History Reviewed & Updated per EMR.   Pertinent Historical Findings include:  Past Medical History:  Diagnosis Date  . Dysrhythmia    ST/notes 12/10/2012  . History of cold sores   . Hypertension   . Multiple thyroid nodules   . Photosensitivity 04/17/2012   3/14 new   . Pneumomediastinum ([place])    Pneumomediastinum in the setting of progressive nodular pulmonary infiltrates with dermatologic vasculitis/notes 12/10/2012  . Pneumomediastinum ([place]) 03/03/2013  . Pneumonia 02/2012   "once" (12/10/2012)  . Pulmonary nodules/lesions, multiple 10/13/2012  . Rheumatoid arthritis ([place])    "legs; arms" (12/10/2012)  . 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 ABDOMINAL HYSTERECTOMY  ~ 2006  . LUNG BIOPSY Left 10/18/2012   Procedure: LUNG BIOPSY;  Surgeon: Rexene Alberts, MD;  Location: [facility];  Service: Thoracic;  Laterality: Left;  [name] VIDEO ASSISTED THORACOSCOPY Left 10/18/2012   Procedure: VIDEO ASSISTED THORACOSCOPY;  Surgeon: Rexene Alberts, MD;  Location: [facility];  Service: Thoracic;  Laterality: Left;  [name] VIDEO ASSISTED THORACOSCOPY (VATS)/DECORTICATION Left 07/01/2015   Procedure: VIDEO ASSISTED THORACOSCOPY (VATS)/DECORTICATION;  Surgeon: Melrose Nakayama, MD;  Location: [facility];  Service: Thoracic;  Laterality: Left;  [name] VIDEO BRONCHOSCOPY Left 10/18/2012   Procedure: VIDEO BRONCHOSCOPY;  Surgeon: Rexene Alberts, MD;  Location: [facility];  Service: Thoracic;  Laterality: Left;    Allergies  Allergen Reactions  . Benazepril Hcl     REACTION: cough  . Tramadol     n/v  . Ciprofloxacin Rash    Joint pain REACTION: Rhabdomyolysis    Family History  Problem Relation Age of Onset  . Hypertension Mother   . Cancer Father 50       prostate ca  . Hypertension Other      Social History   Socioeconomic History  . Marital status: Married    Spouse name: Not on file  . Number of children: 1  . Years of education: Not on file  . Highest education level: Not on file  Social Needs  . Financial resource strain: Not on file  . Food insecurity - worry: Not on file  . Food insecurity - inability: Not on file  . Transportation needs - medical: Not on file  . Transportation needs - non-medical: Not on file  Occupational History  . 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 Smoking status: Never Smoker  . Smokeless tobacco: Never Used  Substance and Sexual Activity  . Alcohol use: No  . Drug use: No  . Sexual activity: Yes  Other Topics Concern  . Not on file  Social History Narrative  . Not on file     PHYSICAL EXAM:  VS: BP (!) 168/88 (BP Location: Right Arm, Patient Position: Sitting, Cuff Size: Normal)   Pulse 71   Temp 98.2 F (36.8 C) (Oral)   Ht 5\' 7"  (1.702 m)   Wt 169 lb (76.7 kg)   SpO2 98%    BMI 26.47 kg/m  Physical Exam Gen: NAD, alert, cooperative with exam, well-appearing ENT: normal lips, normal nasal mucosa,  Eye: normal EOM, normal conjunctiva and lids CV:  no edema, +2 pedal pulses, regular rate and rhythm, S1-S2   Resp: no accessory muscle use, non-labored,  Skin: no rashes, no areas of induration  Neuro: normal tone, normal sensation to touch Psych:  normal insight, alert and oriented MSK: normal gait, normal strength     ASSESSMENT & PLAN:   Essential hypertension Uncontrolled currently. - Restarted amlodipine and continue Benicar - Given indications on when to follow-up with PCP.

## 2017-04-06 NOTE — Assessment & Plan Note (Signed)
Uncontrolled currently. - Restarted amlodipine and continue Benicar - Given indications on when to follow-up with PCP.

## 2017-04-17 ENCOUNTER — Encounter: Payer: Self-pay | Admitting: Internal Medicine

## 2017-04-17 ENCOUNTER — Ambulatory Visit: Payer: 59 | Admitting: Internal Medicine

## 2017-04-17 DIAGNOSIS — I10 Essential (primary) hypertension: Secondary | ICD-10-CM | POA: Diagnosis not present

## 2017-04-17 MED ORDER — PREDNISONE 1 MG PO TABS: 2.0000 mg | ORAL_TABLET | Freq: Every day | ORAL | 3 refills | Status: DC

## 2017-04-17 MED ORDER — TRIAMTERENE-HCTZ 37.5-25 MG PO TABS
1.0000 | ORAL_TABLET | Freq: Every day | ORAL | 11 refills | Status: DC
Start: 2017-04-17 — End: 2017-09-12

## 2017-04-17 NOTE — Progress Notes (Signed)
Subjective:  Patient ID: Jamie [name], female    DOB: [birth year]  Age: 52 y.o. MRN: [id number]  CC: No chief complaint on file.   HPI Jamie [name] presents for HTN - Benicar did not help. On Amlodipine 10 mg/d  Outpatient Medications Prior to Visit  Medication Sig Dispense Refill  . amLODipine (NORVASC) 10 MG tablet Take 1 tablet (10 mg total) by mouth daily. 90 tablet 0  . hydroxychloroquine (PLAQUENIL) 200 MG tablet Take 300-400 mg by mouth every other day. Alternating between 300mg  and 400mg  daily.    [name] MINIVELLE 0.075 MG/24HR   1  . mycophenolate (CELLCEPT) 500 MG tablet Take 2 tablets by mouth 2 (two) times daily.    [name] olmesartan (BENICAR) 20 MG tablet Take 1 tablet (20 mg total) by mouth daily. 30 tablet 11  . ondansetron (ZOFRAN) 4 MG tablet Take 1 tablet (4 mg total) by mouth every 8 (eight) hours as needed for nausea or vomiting. 20 tablet 0  . predniSONE (DELTASONE) 1 MG tablet Take 4 mg by mouth daily with breakfast.    . sertraline (ZOLOFT) 50 MG tablet Take 1 tablet (50 mg total) by mouth daily. 30 tablet 11  . valACYclovir (VALTREX) 500 MG tablet Take 1 tablet (500 mg total) by mouth 2 (two) times daily. Overdue for annual appt must see MD for refills 14 tablet 0   No facility-administered medications prior to visit.     ROS Review of Systems  Constitutional: Negative for activity change, appetite change, chills, fatigue and unexpected weight change.  HENT: Negative for congestion, mouth sores and sinus pressure.   Eyes: Negative for visual disturbance.  Respiratory: Negative for cough and chest tightness.   Gastrointestinal: Negative for abdominal pain and nausea.  Genitourinary: Negative for difficulty urinating, frequency and vaginal pain.  Musculoskeletal: Negative for back pain and gait problem.  Skin: Negative for pallor and rash.  Neurological: Negative for dizziness, tremors, weakness, numbness and headaches.  Psychiatric/Behavioral: Negative for  confusion and sleep disturbance.    Objective:  BP (!) 142/94 (BP Location: Left Arm, Patient Position: Sitting, Cuff Size: Large)   Pulse 92   Temp 98 F (36.7 C) (Oral)   Ht 5\' 7"  (1.702 m)   Wt 165 lb (74.8 kg)   SpO2 100%   BMI 25.84 kg/m   BP Readings from Last 3 Encounters:  04/17/17 (!) 142/94  04/05/17 (!) 168/88  03/02/17 134/86    Wt Readings from Last 3 Encounters:  04/17/17 165 lb (74.8 kg)  04/05/17 169 lb (76.7 kg)  03/02/17 166 lb (75.3 kg)    Physical Exam  Constitutional: She appears well-developed. No distress.  HENT:  Head: Normocephalic.  Right Ear: External ear normal.  Left Ear: External ear normal.  Nose: Nose normal.  Mouth/Throat: Oropharynx is clear and moist.  Eyes: Conjunctivae are normal. Pupils are equal, round, and reactive to light. Right eye exhibits no discharge. Left eye exhibits no discharge.  Neck: Normal range of motion. Neck supple. No JVD present. No tracheal deviation present. No thyromegaly present.  Cardiovascular: Normal rate, regular rhythm and normal heart sounds.  Pulmonary/Chest: No stridor. No respiratory distress. She has no wheezes.  Abdominal: Soft. Bowel sounds are normal. She exhibits no distension and no mass. There is no tenderness. There is no rebound and no guarding.  Musculoskeletal: She exhibits no edema or tenderness.  Lymphadenopathy:    She has no cervical adenopathy.  Neurological: She displays normal reflexes. No cranial nerve  deficit. She exhibits normal muscle tone. Coordination normal.  Skin: No rash noted. No erythema.  Psychiatric: She has a normal mood and affect. Her behavior is normal. Judgment and thought content normal.    Lab Results  Component Value Date   WBC 14.2 (H) 07/03/2015   HGB 11.0 (L) 07/03/2015   HCT 35.0 (L) 07/03/2015   PLT 233 07/03/2015   GLUCOSE 103 (H) 07/03/2015   CHOL 152 10/27/2010   TRIG 35.0 10/27/2010   HDL 50.80 10/27/2010   LDLCALC 94 10/27/2010   ALT 11 (L)  07/03/2015   AST 15 07/03/2015   NA 140 07/03/2015   K 3.6 07/03/2015   CL 103 07/03/2015   CREATININE 0.56 07/03/2015   BUN 5 (L) 07/03/2015   CO2 26 07/03/2015   TSH 0.659 06/28/2015   INR 1.10 06/30/2015   HGBA1C 5.9 (H) 10/14/2012    Mm Screening Breast Tomo Bilateral  Result Date: 03/16/2017 CLINICAL DATA:  Screening. EXAM: 2D DIGITAL SCREENING BILATERAL MAMMOGRAM WITH 3D TOMO WITH CAD COMPARISON:  Previous exam(s). ACR Breast Density Category d: The breast tissue is extremely dense, which lowers the sensitivity of mammography. FINDINGS: There are no findings suspicious for malignancy. Images were processed with CAD. IMPRESSION: No mammographic evidence of malignancy. A result letter of this screening mammogram will be mailed directly to the patient. RECOMMENDATION: Screening mammogram in one year. (Code:SM-B-01Y) BI-RADS CATEGORY  1: Negative. Electronically Signed   By: Ammie Ferrier M.D.   On: 03/16/2017 17:00    Assessment & Plan:   There are no diagnoses linked to this encounter. I am having Jamie [name] maintain her hydroxychloroquine, mycophenolate, predniSONE, olmesartan, ondansetron, sertraline, MINIVELLE, amLODipine, and valACYclovir.  No orders of the defined types were placed in this encounter.    Follow-up: No Follow-up on file.  Walker Kehr, MD

## 2017-04-17 NOTE — Assessment & Plan Note (Signed)
Amlodipine and added Maxzide RTC 3 mo

## 2017-05-30 IMAGING — CR DG CHEST 2V
2 series · 2 of 2 positions shown · non-contrast
Comparison: 06/11/2015

CLINICAL DATA: Chest pain, shortness of breath, and syncope.

EXAM:
CHEST  2 VIEW

[chest pa]
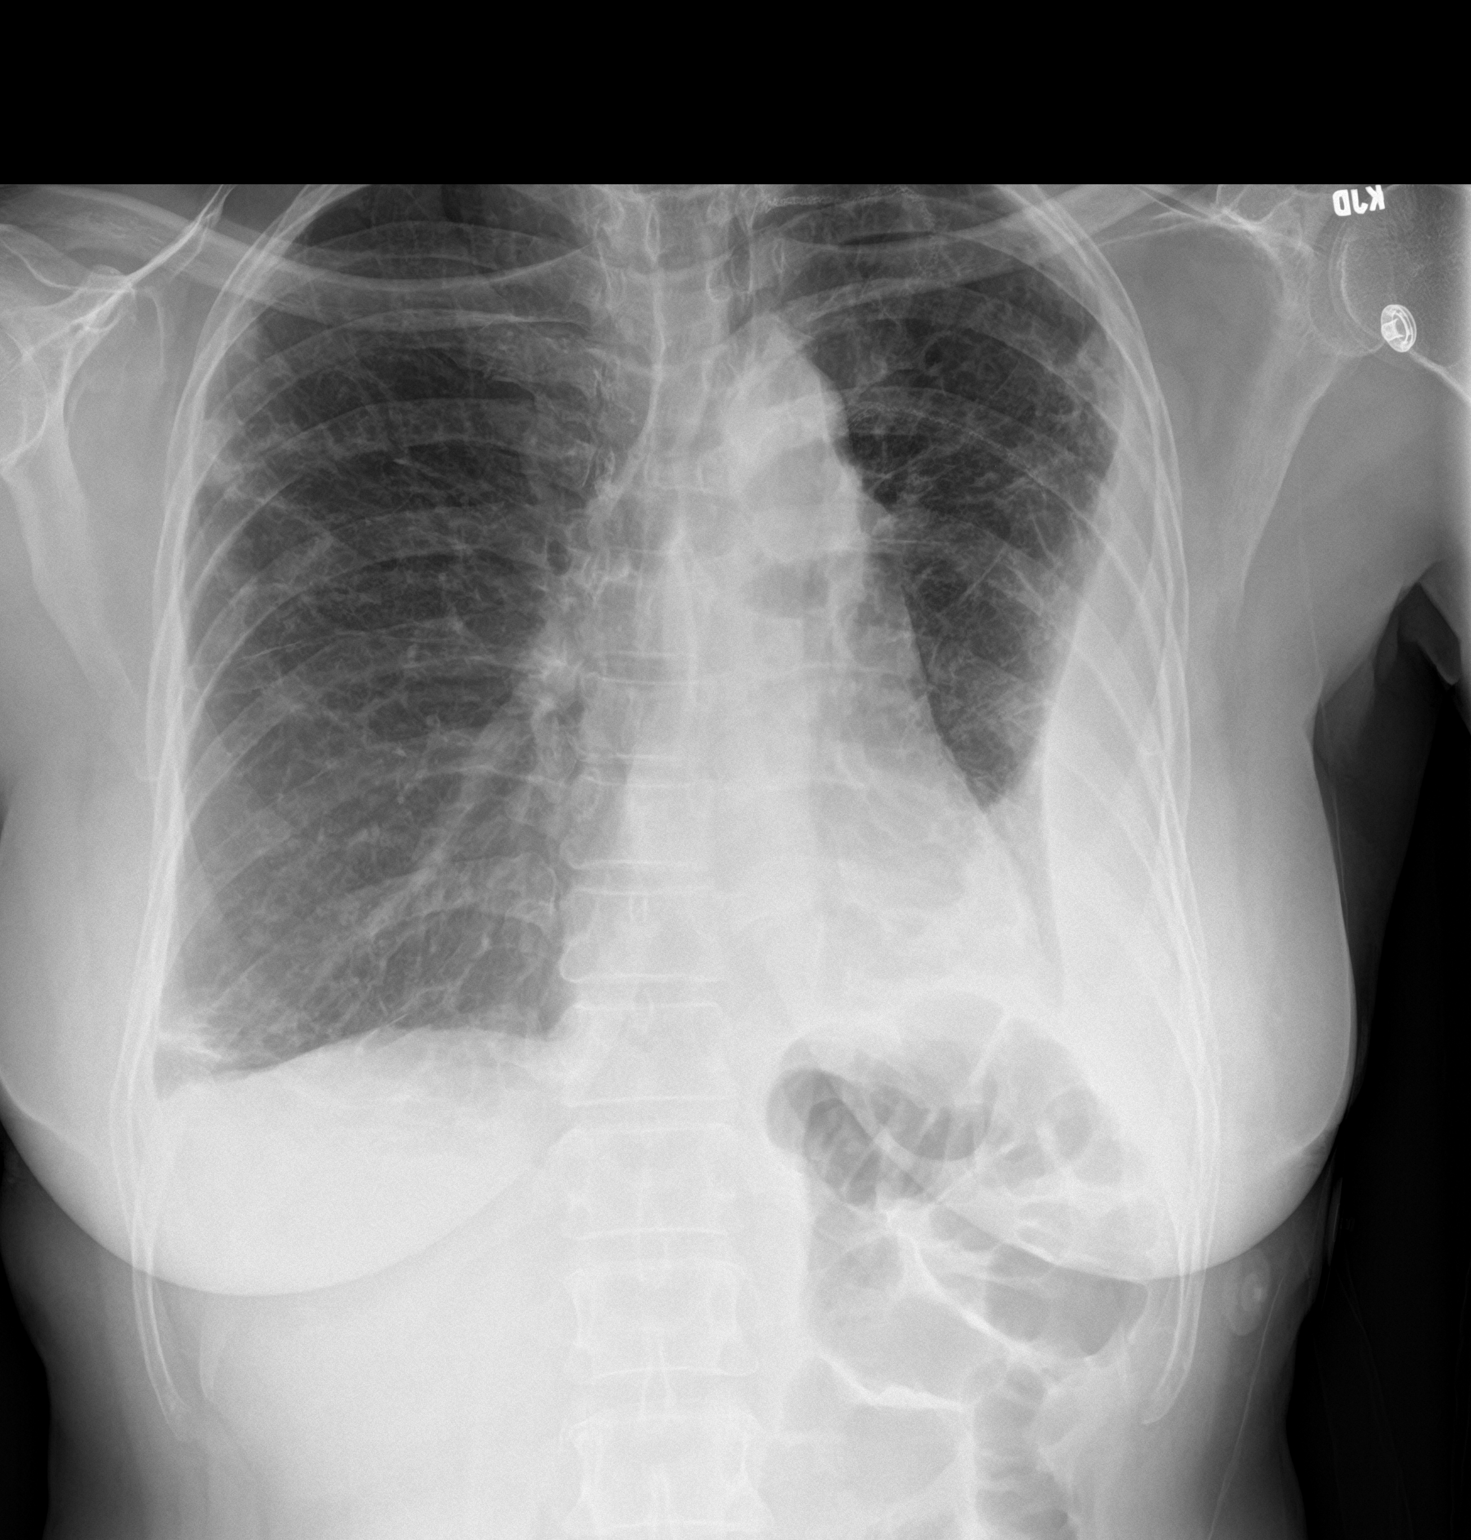

[chest lat]
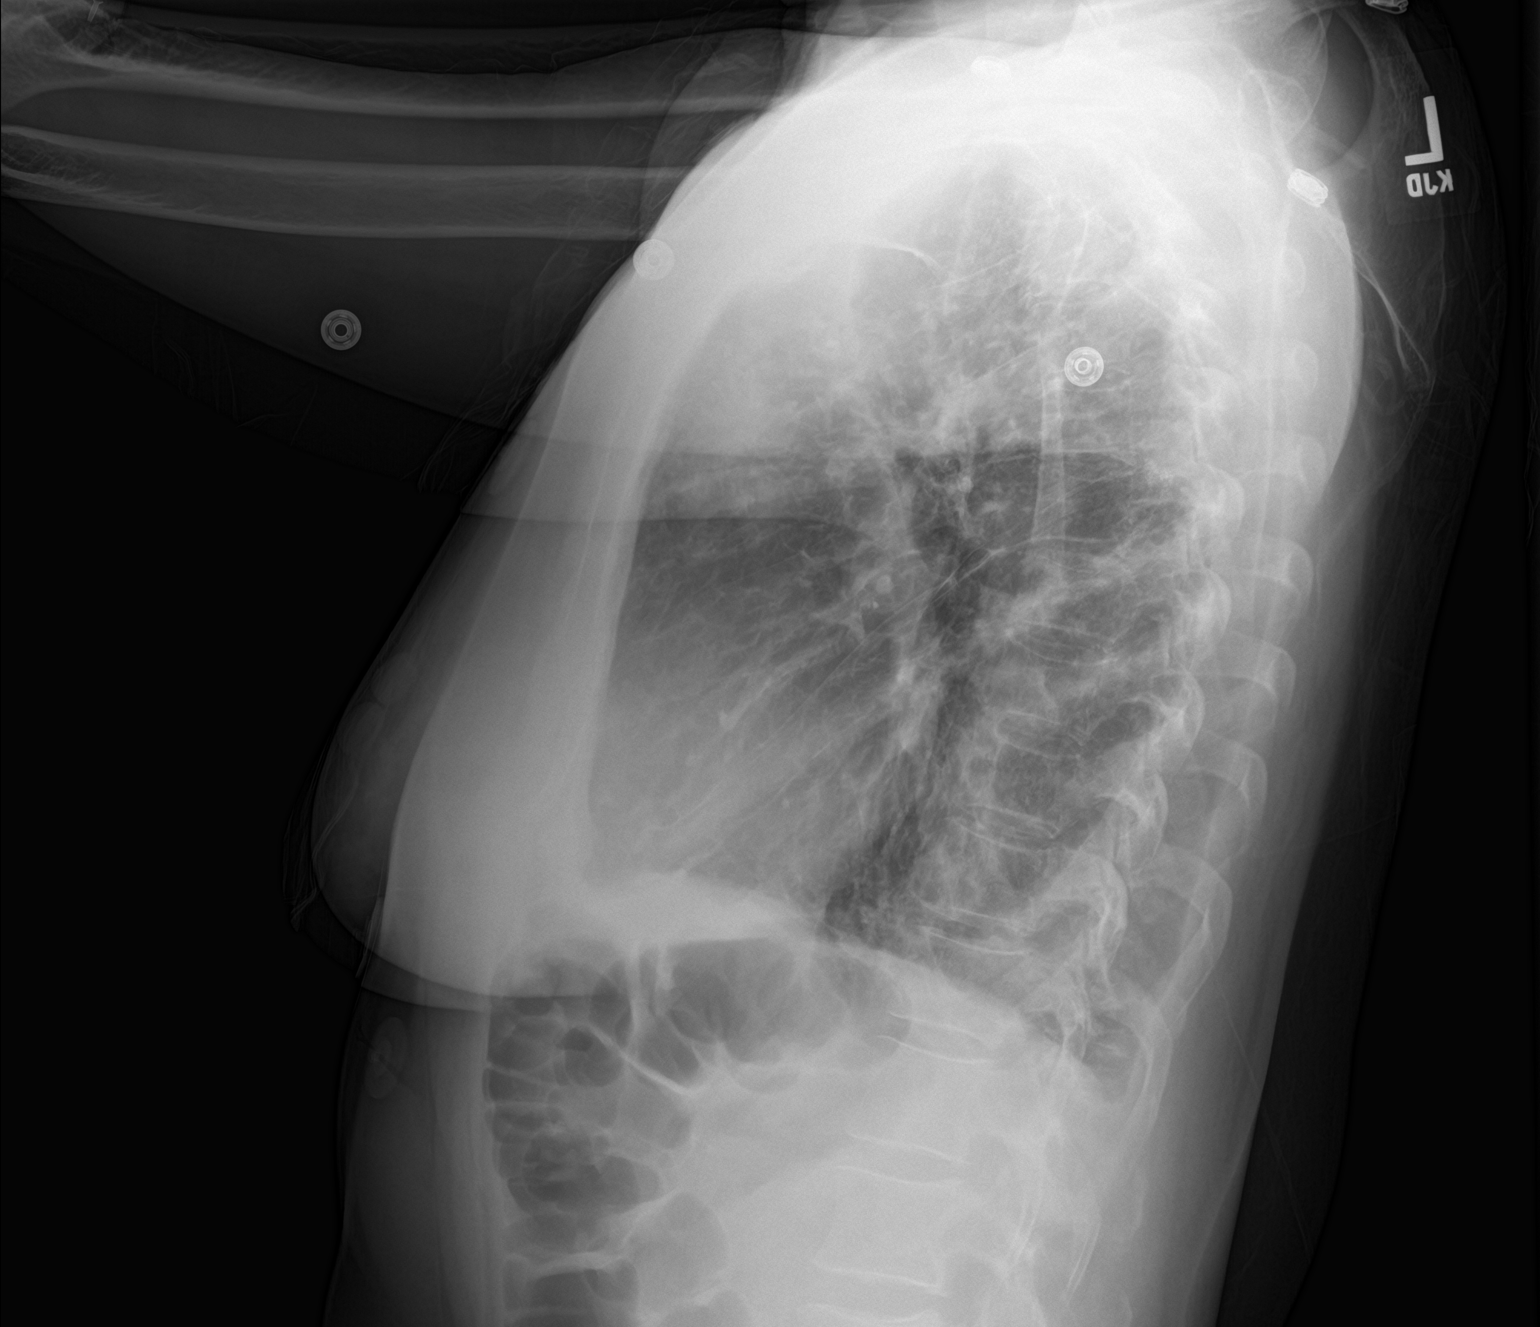

[2 of 2 positions shown; findings below may reference images not displayed]

FINDINGS: Normal heart size and pulmonary vascularity. Emphysematous changes
and scattered fibrosis in the lungs. Peripheral opacities in the
upper lungs bilaterally and in the right lower lung correspond to
peripheral lesions seen on previous CT, some with cavitation. No
progression since previous study. There is a left pleural opacity
likely representing a loculated fluid along the left chest wall.
Atelectasis in the left lung base. No significant change since
previous study. No pneumothorax. Mediastinal contours appear intact.
IMPRESSION: Unchanged appearance of the chest since previous study. Again
demonstrated are emphysematous changes with scattered fibrosis,
peripheral opacities, and loculated fluid collection in the left
lateral chest.

## 2017-08-07 ENCOUNTER — Other Ambulatory Visit: Payer: Self-pay | Admitting: Internal Medicine

## 2017-08-07 NOTE — Telephone Encounter (Signed)
Copied from [place] [id number]. Topic: Quick Communication - Rx Refill/Question >> Aug 07, 2017 10:12 AM Margot Ables wrote: Medication: valACYclovir (VALTREX) 500 MG tablet - pt requesting for fever blister  Has the patient contacted their pharmacy? Yes - no refills Preferred Pharmacy (with phone number or street name): [facility] [phone] - [place], [place] [address] [phone] (Phone) [phone] (Fax)

## 2017-08-08 MED ORDER — VALACYCLOVIR HCL 500 MG PO TABS: 500.0000 mg | ORAL_TABLET | Freq: Two times a day (BID) | ORAL | 0 refills | Status: DC | PRN

## 2017-08-08 NOTE — Telephone Encounter (Signed)
Reviewed chart pt is up-to-date sent refills to [facility].../lmb  

## 2017-08-08 NOTE — Telephone Encounter (Signed)
LOV  04/17/17 Dr. Alain Marion Last refill 04/05/17  # 14 with 0 refill

## 2017-08-22 ENCOUNTER — Ambulatory Visit: Payer: 59 | Admitting: Internal Medicine

## 2017-09-12 ENCOUNTER — Ambulatory Visit: Payer: 59 | Admitting: Internal Medicine

## 2017-09-12 ENCOUNTER — Encounter: Payer: Self-pay | Admitting: Internal Medicine

## 2017-09-12 DIAGNOSIS — M0520 Rheumatoid vasculitis with rheumatoid arthritis of unspecified site: Secondary | ICD-10-CM

## 2017-09-12 DIAGNOSIS — I10 Essential (primary) hypertension: Secondary | ICD-10-CM

## 2017-09-12 DIAGNOSIS — F419 Anxiety disorder, unspecified: Secondary | ICD-10-CM | POA: Diagnosis not present

## 2017-09-12 DIAGNOSIS — L568 Other specified acute skin changes due to ultraviolet radiation: Secondary | ICD-10-CM

## 2017-09-12 MED ORDER — TRIAMTERENE-HCTZ 37.5-25 MG PO TABS: 1.0000 | ORAL_TABLET | Freq: Every day | ORAL | 11 refills | Status: DC

## 2017-09-12 MED ORDER — AMLODIPINE BESYLATE 10 MG PO TABS: 10.0000 mg | ORAL_TABLET | Freq: Every day | ORAL | 11 refills | Status: DC

## 2017-09-12 NOTE — Assessment & Plan Note (Signed)
Off Zoloft

## 2017-09-12 NOTE — Assessment & Plan Note (Signed)
Cellcept Plaquenil Prednisone 2 mg

## 2017-09-12 NOTE — Progress Notes (Signed)
Subjective:  Patient ID: Jamie [name], female    DOB: 12/31/1965  Age: 52 y.o. MRN: [id number]  CC: No chief complaint on file.   HPI Jamie [name] presents for HTN, sterss, vasculitis f/u  Outpatient Medications Prior to Visit  Medication Sig Dispense Refill  . amLODipine (NORVASC) 10 MG tablet Take 1 tablet (10 mg total) by mouth daily. 90 tablet 0  . hydroxychloroquine (PLAQUENIL) 200 MG tablet Take 300-400 mg by mouth every other day. Alternating between 300mg  and 400mg  daily.    [name] MINIVELLE 0.075 MG/24HR   1  . mycophenolate (CELLCEPT) 500 MG tablet Take 2 tablets by mouth 2 (two) times daily.    . ondansetron (ZOFRAN) 4 MG tablet Take 1 tablet (4 mg total) by mouth every 8 (eight) hours as needed for nausea or vomiting. 20 tablet 0  . predniSONE (DELTASONE) 1 MG tablet Take 2 tablets (2 mg total) by mouth daily with breakfast. 60 tablet 3  . sertraline (ZOLOFT) 50 MG tablet Take 1 tablet (50 mg total) by mouth daily. 30 tablet 11  . triamterene-hydrochlorothiazide (MAXZIDE-25) 37.5-25 MG tablet Take 1 tablet by mouth daily. 30 tablet 11  . valACYclovir (VALTREX) 500 MG tablet Take 1 tablet (500 mg total) by mouth 2 (two) times daily as needed. 14 tablet 0   No facility-administered medications prior to visit.     ROS: Review of Systems  Constitutional: Negative for activity change, appetite change, chills, fatigue and unexpected weight change.  HENT: Negative for congestion, mouth sores and sinus pressure.   Eyes: Negative for visual disturbance.  Respiratory: Negative for cough and chest tightness.   Gastrointestinal: Negative for abdominal pain and nausea.  Genitourinary: Negative for difficulty urinating, frequency and vaginal pain.  Musculoskeletal: Negative for back pain and gait problem.  Skin: Negative for pallor and rash.  Neurological: Negative for dizziness, tremors, weakness, numbness and headaches.  Psychiatric/Behavioral: Negative for confusion, sleep  disturbance and suicidal ideas.    Objective:  BP 136/86 (BP Location: Left Arm, Patient Position: Sitting, Cuff Size: Normal)   Pulse 86   Temp 98.2 F (36.8 C) (Oral)   Ht 5\' 7"  (1.702 m)   Wt 171 lb (77.6 kg)   SpO2 99%   BMI 26.78 kg/m   BP Readings from Last 3 Encounters:  09/12/17 136/86  04/17/17 (!) 142/94  04/05/17 (!) 168/88    Wt Readings from Last 3 Encounters:  09/12/17 171 lb (77.6 kg)  04/17/17 165 lb (74.8 kg)  04/05/17 169 lb (76.7 kg)    Physical Exam  Constitutional: She appears well-developed. No distress.  HENT:  Head: Normocephalic.  Right Ear: External ear normal.  Left Ear: External ear normal.  Nose: Nose normal.  Mouth/Throat: Oropharynx is clear and moist.  Eyes: Pupils are equal, round, and reactive to light. Conjunctivae are normal. Right eye exhibits no discharge. Left eye exhibits no discharge.  Neck: Normal range of motion. Neck supple. No JVD present. No tracheal deviation present. No thyromegaly present.  Cardiovascular: Normal rate, regular rhythm and normal heart sounds.  Pulmonary/Chest: No stridor. No respiratory distress. She has no wheezes.  Abdominal: Soft. Bowel sounds are normal. She exhibits no distension and no mass. There is no tenderness. There is no rebound and no guarding.  Musculoskeletal: She exhibits no edema or tenderness.  Lymphadenopathy:    She has no cervical adenopathy.  Neurological: She displays normal reflexes. No cranial nerve deficit. She exhibits normal muscle tone. Coordination normal.  Skin: No  rash noted. No erythema.  Psychiatric: She has a normal mood and affect. Her behavior is normal. Judgment and thought content normal.    Lab Results  Component Value Date   WBC 14.2 (H) 07/03/2015   HGB 11.0 (L) 07/03/2015   HCT 35.0 (L) 07/03/2015   PLT 233 07/03/2015   GLUCOSE 103 (H) 07/03/2015   CHOL 152 10/27/2010   TRIG 35.0 10/27/2010   HDL 50.80 10/27/2010   LDLCALC 94 10/27/2010   ALT 11 (L)  07/03/2015   AST 15 07/03/2015   NA 140 07/03/2015   K 3.6 07/03/2015   CL 103 07/03/2015   CREATININE 0.56 07/03/2015   BUN 5 (L) 07/03/2015   CO2 26 07/03/2015   TSH 0.659 06/28/2015   INR 1.10 06/30/2015   HGBA1C 5.9 (H) 10/14/2012    Mm Screening Breast Tomo Bilateral  Result Date: 03/16/2017 CLINICAL DATA:  Screening. EXAM: 2D DIGITAL SCREENING BILATERAL MAMMOGRAM WITH 3D TOMO WITH CAD COMPARISON:  Previous exam(s). ACR Breast Density Category d: The breast tissue is extremely dense, which lowers the sensitivity of mammography. FINDINGS: There are no findings suspicious for malignancy. Images were processed with CAD. IMPRESSION: No mammographic evidence of malignancy. A result letter of this screening mammogram will be mailed directly to the patient. RECOMMENDATION: Screening mammogram in one year. (Code:SM-B-01Y) BI-RADS CATEGORY  1: Negative. Electronically Signed   By: Ammie Ferrier M.D.   On: 03/16/2017 17:00    Assessment & Plan:   There are no diagnoses linked to this encounter.   No orders of the defined types were placed in this encounter.    Follow-up: No follow-ups on file.  Walker Kehr, MD

## 2017-09-12 NOTE — Assessment & Plan Note (Signed)
Amlodipine and Maxzide

## 2017-09-12 NOTE — Assessment & Plan Note (Signed)
Discussed.

## 2017-09-13 ENCOUNTER — Encounter: Payer: Self-pay | Admitting: Internal Medicine

## 2018-01-24 ENCOUNTER — Other Ambulatory Visit: Payer: Self-pay | Admitting: Internal Medicine

## 2018-01-24 NOTE — Telephone Encounter (Signed)
Copied from [place] [id number]. Topic: Quick Communication - Rx Refill/Question >> Jan 24, 2018  1:27 PM Wynetta Emery, [place] C wrote: Medication: valACYclovir (VALTREX) 500 MG tablet  Has the patient contacted their pharmacy? Yes   (Agent: If no, request that the patient contact the pharmacy for the refill.) (Agent: If yes, when and what did the pharmacy advise?)  Preferred Pharmacy (with phone number or street name): [facility] [phone] - [place], [address] AT [address] [phone] (Phone) [phone] (Fax)    Agent: Please be advised that RX refills may take up to 3 business days. We ask that you follow-up with your pharmacy.

## 2018-01-25 MED ORDER — VALACYCLOVIR HCL 500 MG PO TABS: 500.0000 mg | ORAL_TABLET | Freq: Two times a day (BID) | ORAL | 0 refills | Status: DC | PRN

## 2018-03-18 ENCOUNTER — Other Ambulatory Visit: Payer: Self-pay | Admitting: Obstetrics and Gynecology

## 2018-03-18 DIAGNOSIS — Z1231 Encounter for screening mammogram for malignant neoplasm of breast: Secondary | ICD-10-CM

## 2018-04-17 ENCOUNTER — Ambulatory Visit
Admission: RE | Admit: 2018-04-17 | Discharge: 2018-04-17 | Disposition: A | Discharge status: Home / Self Care | Payer: 59 | Source: Ambulatory Visit | Attending: Obstetrics and Gynecology | Admitting: Obstetrics and Gynecology

## 2018-04-17 DIAGNOSIS — Z1231 Encounter for screening mammogram for malignant neoplasm of breast: Secondary | ICD-10-CM

## 2018-09-27 ENCOUNTER — Other Ambulatory Visit: Payer: Self-pay | Admitting: Internal Medicine

## 2018-10-27 ENCOUNTER — Other Ambulatory Visit: Payer: Self-pay | Admitting: Internal Medicine

## 2018-12-13 ENCOUNTER — Other Ambulatory Visit: Payer: Self-pay | Admitting: Internal Medicine

## 2018-12-13 NOTE — Telephone Encounter (Signed)
Medication Refill - Medication: valACYclovir (VALTREX) 500 MG tablet   Preferred Pharmacy:  [facility] [place], [address] AT [address]  [place] [address]  Phone: [phone] Fax: [phone]    Pt was advised that RX refills may take up to 3 business days. We ask that you follow-up with your pharmacy.

## 2018-12-20 NOTE — Addendum Note (Signed)
Addended by: Jefferson Fuel on: 12/20/2018 09:34 AM   Modules accepted: Orders

## 2018-12-20 NOTE — Telephone Encounter (Signed)
Pt called to follow up on rx for valACYclovir (VALTREX) 500 MG tablet Requesting CB today and stated she requested refill a week ago. Please advise.

## 2018-12-20 NOTE — Telephone Encounter (Signed)
appt scheduled

## 2018-12-20 NOTE — Telephone Encounter (Signed)
Requested medication (s) are due for refill today: yes  Requested medication (s) are on the active medication list: yes  Last refill:  01/24/2018  Future visit scheduled: no  Notes to clinic:  Spoke with patient this morning and advise her to contact office to schedule a appointment   Requested Prescriptions  Pending Prescriptions Disp Refills   valACYclovir (VALTREX) 500 MG tablet 14 tablet 0    Sig: Take 1 tablet (500 mg total) by mouth 2 (two) times daily as needed.     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

## 2019-01-02 ENCOUNTER — Ambulatory Visit (INDEPENDENT_AMBULATORY_CARE_PROVIDER_SITE_OTHER): Payer: 59 | Admitting: Internal Medicine

## 2019-01-02 ENCOUNTER — Encounter: Payer: Self-pay | Admitting: Internal Medicine

## 2019-01-02 ENCOUNTER — Other Ambulatory Visit: Payer: Self-pay

## 2019-01-02 DIAGNOSIS — Z0000 Encounter for general adult medical examination without abnormal findings: Secondary | ICD-10-CM | POA: Diagnosis not present

## 2019-01-02 MED ORDER — VALACYCLOVIR HCL 500 MG PO TABS: 500.0000 mg | ORAL_TABLET | Freq: Two times a day (BID) | ORAL | 0 refills | Status: DC | PRN

## 2019-01-02 MED ORDER — VITAMIN D3 1.25 MG (50000 UT) PO CAPS: 1.0000 | ORAL_CAPSULE | ORAL | 3 refills | Status: AC

## 2019-01-02 MED ORDER — AMLODIPINE BESYLATE 10 MG PO TABS: 10.0000 mg | ORAL_TABLET | Freq: Every day | ORAL | 3 refills | Status: DC

## 2019-01-02 MED ORDER — TRIAMTERENE-HCTZ 37.5-25 MG PO TABS: 1.0000 | ORAL_TABLET | Freq: Every day | ORAL | 3 refills | Status: DC

## 2019-01-02 NOTE — Progress Notes (Signed)
Subjective:  Patient ID: Jamie [name], female    DOB: May 09, 1965  Age: 53 y.o. MRN: [id number]  CC: No chief complaint on file.   HPI Jamie [name] presents for a well exam C/o a fever blister 2 wks ago F/u HTN and CTD  Outpatient Medications Prior to Visit  Medication Sig Dispense Refill  . amLODipine (NORVASC) 10 MG tablet TAKE 1 TABLET(10 MG) BY MOUTH DAILY 90 tablet 3  . hydroxychloroquine (PLAQUENIL) 200 MG tablet Take 300-400 mg by mouth every other day. Alternating between 300mg  and 400mg  daily.    [name] MINIVELLE 0.075 MG/24HR   1  . mycophenolate (CELLCEPT) 500 MG tablet Take 2 tablets by mouth 2 (two) times daily.    . ondansetron (ZOFRAN) 4 MG tablet Take 1 tablet (4 mg total) by mouth every 8 (eight) hours as needed for nausea or vomiting. 20 tablet 0  . predniSONE (DELTASONE) 1 MG tablet Take 2 tablets (2 mg total) by mouth daily with breakfast. 60 tablet 3  . valACYclovir (VALTREX) 500 MG tablet Take 1 tablet (500 mg total) by mouth 2 (two) times daily as needed. 14 tablet 0  . triamterene-hydrochlorothiazide (MAXZIDE-25) 37.5-25 MG tablet Take 1 tablet by mouth daily. 30 tablet 11   No facility-administered medications prior to visit.     ROS: Review of Systems  Constitutional: Negative for activity change, appetite change, chills, fatigue and unexpected weight change.  HENT: Negative for congestion, mouth sores and sinus pressure.   Eyes: Negative for visual disturbance.  Respiratory: Negative for cough and chest tightness.   Gastrointestinal: Negative for abdominal pain and nausea.  Genitourinary: Negative for difficulty urinating, frequency and vaginal pain.  Musculoskeletal: Positive for arthralgias. Negative for back pain and gait problem.  Skin: Negative for pallor and rash.  Neurological: Negative for dizziness, tremors, weakness, numbness and headaches.  Psychiatric/Behavioral: Negative for confusion, sleep disturbance and suicidal ideas.     Objective:  BP 132/82 (BP Location: Left Arm, Patient Position: Sitting, Cuff Size: Normal)   Pulse 92   Temp 97.8 F (36.6 C) (Oral)   Ht 5\' 7"  (1.702 m)   Wt 167 lb (75.8 kg)   SpO2 98%   BMI 26.16 kg/m   BP Readings from Last 3 Encounters:  01/02/19 132/82  09/12/17 136/86  04/17/17 (!) 142/94    Wt Readings from Last 3 Encounters:  01/02/19 167 lb (75.8 kg)  09/12/17 171 lb (77.6 kg)  04/17/17 165 lb (74.8 kg)    Physical Exam Constitutional:      General: She is not in acute distress.    Appearance: She is well-developed.  HENT:     Head: Normocephalic.     Right Ear: External ear normal.     Left Ear: External ear normal.     Nose: Nose normal.  Eyes:     General:        Right eye: No discharge.        Left eye: No discharge.     Conjunctiva/sclera: Conjunctivae normal.     Pupils: Pupils are equal, round, and reactive to light.  Neck:     Musculoskeletal: Normal range of motion and neck supple.     Thyroid: No thyromegaly.     Vascular: No JVD.     Trachea: No tracheal deviation.  Cardiovascular:     Rate and Rhythm: Normal rate and regular rhythm.     Heart sounds: Normal heart sounds.  Pulmonary:     Effort: No  respiratory distress.     Breath sounds: No stridor. No wheezing.  Abdominal:     General: Bowel sounds are normal. There is no distension.     Palpations: Abdomen is soft. There is no mass.     Tenderness: There is no abdominal tenderness. There is no guarding or rebound.  Musculoskeletal:        General: No tenderness.  Lymphadenopathy:     Cervical: No cervical adenopathy.  Skin:    Findings: No erythema or rash.  Neurological:     Cranial Nerves: No cranial nerve deficit.     Motor: No abnormal muscle tone.     Coordination: Coordination normal.     Deep Tendon Reflexes: Reflexes normal.  Psychiatric:        Behavior: Behavior normal.        Thought Content: Thought content normal.        Judgment: Judgment normal.     Lab  Results  Component Value Date   WBC 14.2 (H) 07/03/2015   HGB 11.0 (L) 07/03/2015   HCT 35.0 (L) 07/03/2015   PLT 233 07/03/2015   GLUCOSE 103 (H) 07/03/2015   CHOL 152 10/27/2010   TRIG 35.0 10/27/2010   HDL 50.80 10/27/2010   LDLCALC 94 10/27/2010   ALT 11 (L) 07/03/2015   AST 15 07/03/2015   NA 140 07/03/2015   K 3.6 07/03/2015   CL 103 07/03/2015   CREATININE 0.56 07/03/2015   BUN 5 (L) 07/03/2015   CO2 26 07/03/2015   TSH 0.659 06/28/2015   INR 1.10 06/30/2015   HGBA1C 5.9 (H) 10/14/2012    Mm 3d Screen Breast Bilateral  Result Date: 04/18/2018 CLINICAL DATA:  Screening. EXAM: DIGITAL SCREENING BILATERAL MAMMOGRAM WITH TOMO AND CAD COMPARISON:  Previous exam(s). ACR Breast Density Category c: The breast tissue is heterogeneously dense, which may obscure small masses. FINDINGS: There are no findings suspicious for malignancy. Images were processed with CAD. IMPRESSION: No mammographic evidence of malignancy. A result letter of this screening mammogram will be mailed directly to the patient. RECOMMENDATION: Screening mammogram in one year. (Code:SM-B-01Y) BI-RADS CATEGORY  1: Negative. Electronically Signed   By: Marin Olp M.D.   On: 04/18/2018 08:28    Assessment & Plan:   There are no diagnoses linked to this encounter.   No orders of the defined types were placed in this encounter.    Follow-up: No follow-ups on file.  Walker Kehr, MD

## 2019-01-02 NOTE — Assessment & Plan Note (Signed)
We discussed age appropriate health related issues, including available/recomended screening tests and vaccinations. We discussed a need for adhering to healthy diet and exercise. Labs were ordered to be later reviewed . All questions were answered.   

## 2019-01-27 ENCOUNTER — Encounter: Payer: Self-pay | Admitting: Internal Medicine

## 2019-01-27 ENCOUNTER — Ambulatory Visit (INDEPENDENT_AMBULATORY_CARE_PROVIDER_SITE_OTHER): Payer: 59 | Admitting: Internal Medicine

## 2019-01-27 ENCOUNTER — Other Ambulatory Visit: Payer: Self-pay

## 2019-01-27 ENCOUNTER — Ambulatory Visit (INDEPENDENT_AMBULATORY_CARE_PROVIDER_SITE_OTHER)
Admission: RE | Admit: 2019-01-27 | Discharge: 2019-01-27 | Disposition: A | Discharge status: Home / Self Care | Payer: 59 | Source: Ambulatory Visit | Attending: Internal Medicine | Admitting: Internal Medicine

## 2019-01-27 DIAGNOSIS — R059 Cough, unspecified: Secondary | ICD-10-CM

## 2019-01-27 DIAGNOSIS — R05 Cough: Secondary | ICD-10-CM

## 2019-01-27 MED ORDER — HYDROCODONE-HOMATROPINE 5-1.5 MG/5ML PO SYRP: 5.0000 mL | ORAL_SOLUTION | Freq: Four times a day (QID) | ORAL | 0 refills | Status: DC | PRN

## 2019-01-27 NOTE — Assessment & Plan Note (Addendum)
Recurrent cough Hycodan syr prn CXR No COVID known exposure

## 2019-01-27 NOTE — Progress Notes (Signed)
Virtual Visit via Video Note  I connected with Jamie [name] on 01/27/19 at 11:20 AM EST by a video enabled telemedicine application and verified that I am speaking with the correct person using two identifiers.   I discussed the limitations of evaluation and management by telemedicine and the availability of in person appointments. The patient expressed understanding and agreed to proceed.  History of Present Illness: C/o cough x 1.5-2 wks  There has been no runny nose,  chest pain, shortness of breath, abdominal pain, diarrhea, constipation, arthralgias, skin rashes.   Observations/Objective: The patient appears to be in no acute distress, coughing  Assessment and Plan:  See my Assessment and Plan. Follow Up Instructions:    I discussed the assessment and treatment plan with the patient. The patient was provided an opportunity to ask questions and all were answered. The patient agreed with the plan and demonstrated an understanding of the instructions.   The patient was advised to call back or seek an in-person evaluation if the symptoms worsen or if the condition fails to improve as anticipated.  I provided face-to-face time during this encounter. We were at different locations.   Walker Kehr, MD

## 2019-01-31 ENCOUNTER — Telehealth: Payer: Self-pay

## 2019-01-31 DIAGNOSIS — R05 Cough: Secondary | ICD-10-CM

## 2019-01-31 DIAGNOSIS — R059 Cough, unspecified: Secondary | ICD-10-CM

## 2019-01-31 NOTE — Telephone Encounter (Signed)
Pt notified and states she would like an inhaler as discussed during the OV

## 2019-01-31 NOTE — Telephone Encounter (Signed)
Copied from [place] [id number]. Topic: General - Other >> Jan 30, 2019  3:52 PM Leward Quan A wrote: Reason for CRM: Patient would like a call back with the results of the Xray she had done on 01/27/2019. Can be reached at Ph# [phone]

## 2019-02-04 MED ORDER — BUDESONIDE-FORMOTEROL FUMARATE 160-4.5 MCG/ACT IN AERO: 2.0000 | INHALATION_SPRAY | Freq: Two times a day (BID) | RESPIRATORY_TRACT | 6 refills | Status: AC

## 2019-02-04 NOTE — Telephone Encounter (Signed)
Symbicort Rx emailed Thx

## 2019-02-11 ENCOUNTER — Telehealth: Payer: Self-pay | Admitting: Internal Medicine

## 2019-02-11 MED ORDER — AZITHROMYCIN 250 MG PO TABS: ORAL_TABLET | ORAL | 0 refills | Status: DC

## 2019-02-11 NOTE — Telephone Encounter (Signed)
Pt wanted to advised that she still has the cough and would like Dr. Alain Marion to call in some medication or zpack for it/ please advise

## 2019-02-11 NOTE — Telephone Encounter (Signed)
OK Zpack Sch app w/Pulmonology if not better Thx

## 2019-02-12 NOTE — Telephone Encounter (Signed)
Spoke with patient and info given 

## 2019-04-09 ENCOUNTER — Other Ambulatory Visit: Payer: Self-pay | Admitting: Internal Medicine

## 2019-04-09 DIAGNOSIS — Z1231 Encounter for screening mammogram for malignant neoplasm of breast: Secondary | ICD-10-CM

## 2019-05-14 ENCOUNTER — Ambulatory Visit
Admission: RE | Admit: 2019-05-14 | Discharge: 2019-05-14 | Disposition: A | Discharge status: Home / Self Care | Payer: 59 | Source: Ambulatory Visit | Attending: Internal Medicine | Admitting: Internal Medicine

## 2019-05-14 ENCOUNTER — Other Ambulatory Visit: Payer: Self-pay

## 2019-05-14 DIAGNOSIS — Z1231 Encounter for screening mammogram for malignant neoplasm of breast: Secondary | ICD-10-CM

## 2019-09-12 DIAGNOSIS — Z7952 Long term (current) use of systemic steroids: Secondary | ICD-10-CM | POA: Insufficient documentation

## 2019-09-12 DIAGNOSIS — M8580 Other specified disorders of bone density and structure, unspecified site: Secondary | ICD-10-CM | POA: Insufficient documentation

## 2019-09-19 ENCOUNTER — Telehealth: Payer: Self-pay | Admitting: Internal Medicine

## 2019-09-19 MED ORDER — VALACYCLOVIR HCL 500 MG PO TABS: 500.0000 mg | ORAL_TABLET | Freq: Two times a day (BID) | ORAL | 0 refills | Status: DC | PRN

## 2019-09-19 NOTE — Telephone Encounter (Signed)
See med refill.

## 2019-09-19 NOTE — Telephone Encounter (Signed)
New Message:   1.Medication Requested: valACYclovir (VALTREX) 500 MG tablet 2. Pharmacy (Name, Street, [place]): [facility] [phone] - [place], [address] AT [address] 3. On Med List: yes  4. Last Visit with PCP: 01/02/19  5. Next visit date with PCP:     Agent: Please be advised that RX refills may take up to 3 business days. We ask that you follow-up with your pharmacy.

## 2019-11-06 ENCOUNTER — Other Ambulatory Visit: Payer: Self-pay | Admitting: Internal Medicine

## 2020-04-22 ENCOUNTER — Telehealth: Payer: Self-pay | Admitting: Internal Medicine

## 2020-04-22 NOTE — Telephone Encounter (Signed)
1.Medication Requested: valACYclovir (VALTREX) 500 MG tablet    2. Pharmacy (Name, Street, [place]): [facility] [phone] - [place], [address] AT [address]  3. On Med List: yes   4. Last Visit with PCP: 11.19.20  5. Next visit date with PCP: 3.31.22   Agent: Please be advised that RX refills may take up to 3 business days. We ask that you follow-up with your pharmacy.

## 2020-04-22 NOTE — Telephone Encounter (Signed)
Pt is overdue for appt... Last ov 01/27/19. Will need appt for refills...Jamie [name]

## 2020-04-22 NOTE — Telephone Encounter (Signed)
Patient has an appointment with PCP on 3.31.22. Please advise

## 2020-04-26 MED ORDER — VALACYCLOVIR HCL 500 MG PO TABS: 500.0000 mg | ORAL_TABLET | Freq: Two times a day (BID) | ORAL | 0 refills | Status: DC | PRN

## 2020-04-26 NOTE — Telephone Encounter (Signed)
Patient has a fever blister currently. She is wondering if he would be able to fill it before she came in for her appointment. Patient wants to speak with someone today. She is leaving to go out of town this evening.

## 2020-04-26 NOTE — Addendum Note (Signed)
Addended by: Earnstine Regal on: 04/26/2020 10:10 AM   Modules accepted: Orders

## 2020-04-26 NOTE — Telephone Encounter (Signed)
Notified pt rx has been sent must keep scheduled appt for future refills.Marland KitchenJohny [name]

## 2020-05-12 ENCOUNTER — Other Ambulatory Visit: Payer: Self-pay

## 2020-05-13 ENCOUNTER — Encounter: Payer: Self-pay | Admitting: Internal Medicine

## 2020-05-13 ENCOUNTER — Ambulatory Visit (INDEPENDENT_AMBULATORY_CARE_PROVIDER_SITE_OTHER): Payer: 59 | Admitting: Internal Medicine

## 2020-05-13 VITALS — BP 156/94 | HR 84 | Temp 98.4°F | Ht 66.0 in | Wt 167.0 lb

## 2020-05-13 DIAGNOSIS — I10 Essential (primary) hypertension: Secondary | ICD-10-CM

## 2020-05-13 DIAGNOSIS — Z0000 Encounter for general adult medical examination without abnormal findings: Secondary | ICD-10-CM | POA: Diagnosis not present

## 2020-05-13 MED ORDER — ONDANSETRON HCL 4 MG PO TABS: 4.0000 mg | ORAL_TABLET | Freq: Three times a day (TID) | ORAL | 1 refills | Status: DC | PRN

## 2020-05-13 MED ORDER — TRIAMTERENE-HCTZ 37.5-25 MG PO TABS: 1.0000 | ORAL_TABLET | Freq: Every day | ORAL | 3 refills | Status: DC

## 2020-05-13 MED ORDER — AMLODIPINE BESYLATE 10 MG PO TABS: ORAL_TABLET | ORAL | 3 refills | Status: DC

## 2020-05-13 MED ORDER — VALACYCLOVIR HCL 500 MG PO TABS: 500.0000 mg | ORAL_TABLET | Freq: Two times a day (BID) | ORAL | 2 refills | Status: DC | PRN

## 2020-05-13 NOTE — Assessment & Plan Note (Signed)

## 2020-05-13 NOTE — Assessment & Plan Note (Signed)
Sub optimal control. Cont w/Amlodipine and re-start Maxzide

## 2020-05-13 NOTE — Progress Notes (Signed)
Subjective:  Patient ID: Jamie [name], female    DOB: 07-14-1965  Age: 55 y.o. MRN: [id number]  CC: Annual Exam and Medication Refill   HPI Jamie [name] presents for a well exam. Off Maxzide x 3 mo for ?reason  F/u HTN- SBP 140-150   Outpatient Medications Prior to Visit  Medication Sig Dispense Refill  . amLODipine (NORVASC) 10 MG tablet TAKE 1 TABLET(10 MG) BY MOUTH DAILY 90 tablet 3  . budesonide-formoterol (SYMBICORT) 160-4.5 MCG/ACT inhaler Inhale 2 puffs into the lungs 2 (two) times daily. 1 Inhaler 6  . Cholecalciferol (VITAMIN D3) 1.25 MG (50000 UT) CAPS Take 1 capsule by mouth every 30 (thirty) days. 3 capsule 3  . hydroxychloroquine (PLAQUENIL) 200 MG tablet Take 300-400 mg by mouth every other day. Alternating between 300mg  and 400mg  daily.    [name] MINIVELLE 0.075 MG/24HR   1  . mycophenolate (CELLCEPT) 500 MG tablet Take 2 tablets by mouth 2 (two) times daily.    . ondansetron (ZOFRAN) 4 MG tablet Take 1 tablet (4 mg total) by mouth every 8 (eight) hours as needed for nausea or vomiting. 20 tablet 0  . predniSONE (DELTASONE) 1 MG tablet Take 2 tablets (2 mg total) by mouth daily with breakfast. 60 tablet 3  . valACYclovir (VALTREX) 500 MG tablet Take 1 tablet (500 mg total) by mouth 2 (two) times daily as needed. 14 tablet 0  . azithromycin (ZITHROMAX Z-PAK) 250 MG tablet As directed (Patient not taking: Reported on 05/13/2020) 6 tablet 0  . HYDROcodone-homatropine (HYCODAN) 5-1.5 MG/5ML syrup Take 5 mLs by mouth every 6 (six) hours as needed for cough. (Patient not taking: Reported on 05/13/2020) 240 mL 0  . triamterene-hydrochlorothiazide (MAXZIDE-25) 37.5-25 MG tablet Take 1 tablet by mouth daily. 90 tablet 3   No facility-administered medications prior to visit.    ROS: Review of Systems  Constitutional: Negative for activity change, appetite change, chills, fatigue and unexpected weight change.  HENT: Negative for congestion, mouth sores and sinus  pressure.   Eyes: Negative for visual disturbance.  Respiratory: Negative for cough and chest tightness.   Gastrointestinal: Negative for abdominal pain and nausea.  Genitourinary: Negative for difficulty urinating, frequency and vaginal pain.  Musculoskeletal: Negative for back pain and gait problem.  Skin: Negative for pallor and rash.  Neurological: Negative for dizziness, tremors, weakness, numbness and headaches.  Psychiatric/Behavioral: Negative for confusion and sleep disturbance.    Objective:  BP (!) 156/94 (BP Location: Left Arm, Patient Position: Sitting, Cuff Size: Normal)   Pulse 84   Temp 98.4 F (36.9 C) (Oral)   Ht 5\' 6"  (1.676 m)   Wt 167 lb (75.8 kg)   SpO2 99%   BMI 26.95 kg/m   BP Readings from Last 3 Encounters:  05/13/20 (!) 156/94  01/02/19 132/82  09/12/17 136/86    Wt Readings from Last 3 Encounters:  05/13/20 167 lb (75.8 kg)  01/02/19 167 lb (75.8 kg)  09/12/17 171 lb (77.6 kg)    Physical Exam Constitutional:      General: She is not in acute distress.    Appearance: She is well-developed.  HENT:     Head: Normocephalic.     Right Ear: External ear normal.     Left Ear: External ear normal.     Nose: Nose normal.  Eyes:     General:        Right eye: No discharge.        Left eye: No discharge.  Conjunctiva/sclera: Conjunctivae normal.     Pupils: Pupils are equal, round, and reactive to light.  Neck:     Thyroid: No thyromegaly.     Vascular: No JVD.     Trachea: No tracheal deviation.  Cardiovascular:     Rate and Rhythm: Normal rate and regular rhythm.     Heart sounds: Normal heart sounds.  Pulmonary:     Effort: No respiratory distress.     Breath sounds: No stridor. No wheezing.  Abdominal:     General: Bowel sounds are normal. There is no distension.     Palpations: Abdomen is soft. There is no mass.     Tenderness: There is no abdominal tenderness. There is no guarding or rebound.  Musculoskeletal:        General:  No tenderness.     Cervical back: Normal range of motion and neck supple.  Lymphadenopathy:     Cervical: No cervical adenopathy.  Skin:    Findings: No erythema or rash.  Neurological:     Mental Status: She is oriented to person, place, and time.     Cranial Nerves: No cranial nerve deficit.     Motor: No abnormal muscle tone.     Coordination: Coordination normal.     Gait: Gait normal.     Deep Tendon Reflexes: Reflexes normal.  Psychiatric:        Behavior: Behavior normal.        Thought Content: Thought content normal.        Judgment: Judgment normal.     Lab Results  Component Value Date   WBC 14.2 (H) 07/03/2015   HGB 11.0 (L) 07/03/2015   HCT 35.0 (L) 07/03/2015   PLT 233 07/03/2015   GLUCOSE 103 (H) 07/03/2015   CHOL 152 10/27/2010   TRIG 35.0 10/27/2010   HDL 50.80 10/27/2010   LDLCALC 94 10/27/2010   ALT 11 (L) 07/03/2015   AST 15 07/03/2015   NA 140 07/03/2015   K 3.6 07/03/2015   CL 103 07/03/2015   CREATININE 0.56 07/03/2015   BUN 5 (L) 07/03/2015   CO2 26 07/03/2015   TSH 0.659 06/28/2015   INR 1.10 06/30/2015   HGBA1C 5.9 (H) 10/14/2012    MM 3D SCREEN BREAST BILATERAL  Result Date: 05/14/2019 CLINICAL DATA:  Screening. EXAM: DIGITAL SCREENING BILATERAL MAMMOGRAM WITH TOMO AND CAD COMPARISON:  Previous exam(s). ACR Breast Density Category c: The breast tissue is heterogeneously dense, which may obscure small masses. FINDINGS: There are no findings suspicious for malignancy. Images were processed with CAD. IMPRESSION: No mammographic evidence of malignancy. A result letter of this screening mammogram will be mailed directly to the patient. RECOMMENDATION: Screening mammogram in one year. (Code:SM-B-01Y) BI-RADS CATEGORY  1: Negative. Electronically Signed   By: Nolon Nations M.D.   On: 05/14/2019 11:49    Assessment & Plan:    Walker Kehr, MD

## 2020-07-01 ENCOUNTER — Other Ambulatory Visit: Payer: Self-pay | Admitting: Obstetrics and Gynecology

## 2020-07-01 DIAGNOSIS — Z1231 Encounter for screening mammogram for malignant neoplasm of breast: Secondary | ICD-10-CM

## 2020-07-06 ENCOUNTER — Emergency Department (HOSPITAL_BASED_OUTPATIENT_CLINIC_OR_DEPARTMENT_OTHER): Payer: 59

## 2020-07-06 ENCOUNTER — Encounter (HOSPITAL_BASED_OUTPATIENT_CLINIC_OR_DEPARTMENT_OTHER): Payer: Self-pay

## 2020-07-06 ENCOUNTER — Emergency Department (HOSPITAL_BASED_OUTPATIENT_CLINIC_OR_DEPARTMENT_OTHER)
Admission: EM | Admit: 2020-07-06 | Discharge: 2020-07-06 | Disposition: A | Discharge status: Home / Self Care | Payer: 59 | Attending: Emergency Medicine | Admitting: Emergency Medicine

## 2020-07-06 ENCOUNTER — Other Ambulatory Visit: Payer: Self-pay

## 2020-07-06 DIAGNOSIS — I10 Essential (primary) hypertension: Secondary | ICD-10-CM | POA: Insufficient documentation

## 2020-07-06 DIAGNOSIS — Z79899 Other long term (current) drug therapy: Secondary | ICD-10-CM | POA: Insufficient documentation

## 2020-07-06 DIAGNOSIS — R059 Cough, unspecified: Secondary | ICD-10-CM | POA: Diagnosis present

## 2020-07-06 DIAGNOSIS — U071 COVID-19: Secondary | ICD-10-CM | POA: Diagnosis not present

## 2020-07-06 DIAGNOSIS — J069 Acute upper respiratory infection, unspecified: Secondary | ICD-10-CM

## 2020-07-06 DIAGNOSIS — Z7951 Long term (current) use of inhaled steroids: Secondary | ICD-10-CM | POA: Diagnosis not present

## 2020-07-06 LAB — SARS CORONAVIRUS 2 (TAT 6-24 HRS): SARS Coronavirus 2: POSITIVE — AB

## 2020-07-06 MED ORDER — ONDANSETRON 4 MG PO TBDP
4.0000 mg | ORAL_TABLET | Freq: Once | ORAL | Status: AC
  Administered 2020-07-06: 4 mg via ORAL
  Filled 2020-07-06: qty 1

## 2020-07-06 NOTE — Discharge Instructions (Signed)
Chest x-ray showed no evidence of pneumonia.  Suspect you have a viral process.  Continue Tylenol and Motrin as needed for fever.  COVID test should result later tonight or tomorrow morning.  Follow-up on your MyChart.

## 2020-07-06 NOTE — ED Notes (Signed)
No nausea after Zofran.

## 2020-07-06 NOTE — ED Provider Notes (Signed)
[facility] EMERGENCY DEPARTMENT Provider Note   CSN: [id number] Arrival date & time: 07/06/20  1028     History Chief Complaint  Patient presents with  . Cough    Jamie [name] is a 55 y.o. female.  The history is provided by the patient.  Cough Cough characteristics:  Non-productive Sputum characteristics:  Nondescript Severity:  Mild Onset quality:  Gradual Timing:  Intermittent Progression:  Waxing and waning Chronicity:  New Context: upper respiratory infection   Relieved by:  Nothing Worsened by:  Nothing Associated symptoms: chills and fever   Associated symptoms: no chest pain, no ear pain, no rash, no shortness of breath and no sore throat        Past Medical History:  Diagnosis Date  . Dysrhythmia    ST/notes 12/10/2012  . History of cold sores   . Hypertension   . Multiple thyroid nodules   . Photosensitivity 04/17/2012   3/14 new   . Pneumomediastinum ([place])    Pneumomediastinum in the setting of progressive nodular pulmonary infiltrates with dermatologic vasculitis/notes 12/10/2012  . Pneumomediastinum ([place]) 03/03/2013  . Pneumonia 02/2012   "once" (12/10/2012)  . Pulmonary nodules/lesions, multiple 10/13/2012  . Rheumatoid arthritis ([place])    "legs; arms" (12/10/2012)  . 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 Anxiety 03/02/2017  . Acute gastroenteritis 03/02/2017  . Empyema lung ([place]) 07/01/2015  . Syncope 06/28/2015  . GERD (gastroesophageal reflux disease) 06/28/2015  . Chest pain 06/28/2015  . Pain in the chest   . Pleural effusion, left 06/04/2015  . Allergic rhinitis 10/02/2014  . Sacral ulcer ([place]) 03/13/2013  . Pneumomediastinum ([place]) 12/10/2012  . Follow-up examination, following unspecified surgery 11/11/2012  . Thrush 10/25/2012  . Pulmonary nodules/lesions, multiple  10/13/2012  . 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 Photosensitivity 04/17/2012  . Rash and nonspecific skin eruption 04/17/2012  . Pulmonary infiltrates 03/09/2012  . Rash 01/31/2012  . Arthralgia 01/31/2012  . Leg pain, anterior 06/16/2011  . Well adult exam 11/02/2010  . Polycythemia 11/02/2010  . Headache(784.0) 06/07/2010  . EAR PAIN 11/10/2009  . SEBACEOUS CYST, INFECTED 11/10/2009  . CONSTIPATION, CHRONIC 05/10/2009  . Cough 06/21/2007  . THYROID NODULE 04/24/2007  . Essential hypertension 11/08/2006    Past Surgical History:  Procedure Laterality Date  . ABDOMINAL HYSTERECTOMY  ~ 2006  . LUNG BIOPSY Left 10/18/2012   Procedure: LUNG BIOPSY;  Surgeon: Rexene Alberts, MD;  Location: [facility];  Service: Thoracic;  Laterality: Left;  [name] VIDEO ASSISTED THORACOSCOPY Left 10/18/2012   Procedure: VIDEO ASSISTED THORACOSCOPY;  Surgeon: Rexene Alberts, MD;  Location: [facility];  Service: Thoracic;  Laterality: Left;  [name] VIDEO ASSISTED THORACOSCOPY (VATS)/DECORTICATION Left 07/01/2015   Procedure: VIDEO ASSISTED THORACOSCOPY (VATS)/DECORTICATION;  Surgeon: Melrose Nakayama, MD;  Location: [facility];  Service: Thoracic;  Laterality: Left;  [name] VIDEO BRONCHOSCOPY Left 10/18/2012   Procedure: VIDEO BRONCHOSCOPY;  Surgeon: Rexene Alberts, MD;  Location: [facility];  Service: Thoracic;  Laterality: Left;     OB History   No obstetric history on file.     Family History  Problem Relation Age of Onset  . Hypertension Mother   . Cancer Father 54       prostate ca  . Hypertension Other     Social  History   Tobacco Use  . Smoking status: Never Smoker  . Smokeless tobacco: Never Used  Substance Use Topics  . Alcohol use: No  . Drug use: No    Home Medications Prior to Admission medications   Medication Sig Start Date End Date Taking? Authorizing Provider  amLODipine (NORVASC) 10 MG tablet TAKE 1 TABLET(10 MG) BY MOUTH DAILY 05/13/20   Plotnikov, Evie Lacks,  MD  budesonide-formoterol (SYMBICORT) 160-4.5 MCG/ACT inhaler Inhale 2 puffs into the lungs 2 (two) times daily. 02/04/19   Plotnikov, Evie Lacks, MD  Cholecalciferol (VITAMIN D3) 1.25 MG (50000 UT) CAPS Take 1 capsule by mouth every 30 (thirty) days. 01/02/19   Plotnikov, Evie Lacks, MD  hydroxychloroquine (PLAQUENIL) 200 MG tablet Take 300-400 mg by mouth every other day. Alternating between 300mg  and 400mg  daily. 07/23/14   [provider]  MINIVELLE 0.075 MG/24HR  03/28/17   [provider]  mycophenolate (CELLCEPT) 500 MG tablet Take 2 tablets by mouth 2 (two) times daily. 08/26/14   [provider]  ondansetron (ZOFRAN) 4 MG tablet Take 1 tablet (4 mg total) by mouth every 8 (eight) hours as needed for nausea or vomiting. 05/13/20   Plotnikov, Evie Lacks, MD  predniSONE (DELTASONE) 1 MG tablet Take 2 tablets (2 mg total) by mouth daily with breakfast. 04/17/17   Plotnikov, Evie Lacks, MD  triamterene-hydrochlorothiazide (MAXZIDE-25) 37.5-25 MG tablet Take 1 tablet by mouth daily. 05/13/20 05/13/21  Plotnikov, Evie Lacks, MD  valACYclovir (VALTREX) 500 MG tablet Take 1 tablet (500 mg total) by mouth 2 (two) times daily as needed. 05/13/20   Plotnikov, Evie Lacks, MD    Allergies    Benazepril hcl, Benicar [olmesartan], Tramadol, and Ciprofloxacin  Review of Systems   Review of Systems  Constitutional: Positive for chills and fever.  HENT: Negative for ear pain and sore throat.   Eyes: Negative for pain and visual disturbance.  Respiratory: Positive for cough. Negative for shortness of breath.   Cardiovascular: Negative for chest pain and palpitations.  Gastrointestinal: Positive for nausea. Negative for abdominal pain and vomiting.  Genitourinary: Negative for dysuria and hematuria.  Musculoskeletal: Negative for arthralgias and back pain.  Skin: Negative for color change and rash.  Neurological: Negative for seizures and syncope.  All other systems reviewed and are  negative.   Physical Exam Updated Vital Signs BP (!) 147/94 (BP Location: Right Arm)   Pulse 88   Temp 99 F (37.2 C) (Oral)   Resp 18   Ht 5\' 7"  (1.702 m)   Wt 72.6 kg   SpO2 100%   BMI 25.06 kg/m   Physical Exam Vitals and nursing note reviewed.  Constitutional:      General: She is not in acute distress.    Appearance: She is well-developed. She is not ill-appearing.  HENT:     Head: Normocephalic and atraumatic.     Nose: Nose normal.     Mouth/Throat:     Mouth: Mucous membranes are moist.  Eyes:     Extraocular Movements: Extraocular movements intact.     Conjunctiva/sclera: Conjunctivae normal.     Pupils: Pupils are equal, round, and reactive to light.  Cardiovascular:     Rate and Rhythm: Normal rate and regular rhythm.     Pulses: Normal pulses.     Heart sounds: Normal heart sounds. No murmur heard.   Pulmonary:     Effort: Pulmonary effort is normal. No respiratory distress.     Breath sounds: Normal breath sounds.  Abdominal:     Palpations: Abdomen is soft.     Tenderness: There is no abdominal tenderness.  Musculoskeletal:     Cervical back: Normal range of motion and neck supple.  Skin:    General: Skin is warm and dry.     Capillary Refill: Capillary refill takes less than 2 seconds.  Neurological:     General: No focal deficit present.     Mental Status: She is alert.     ED Results / Procedures / Treatments   Labs (all labs ordered are listed, but only abnormal results are displayed) Labs Reviewed  SARS CORONAVIRUS 2 (TAT 6-24 HRS)    EKG None  Radiology DG Chest Portable 1 View  Result Date: 07/06/2020 CLINICAL DATA:  Cough, congestion, hypertension EXAM: PORTABLE CHEST 1 VIEW COMPARISON:  01/27/2019 FINDINGS: Chronic left apical postsurgical changes. Similar upper lobe subpleural chronic parenchymal scarring/architectural distortion. Similar basilar scarring/fibrotic change, worse on the left. Stable heart size and vascularity. No  large effusion or pneumothorax. No definite new acute chest process or superimposed edema. No acute osseous finding. IMPRESSION: Stable bilateral pleuroparenchymal scarring in the upper lobes and both lung bases. No superimposed acute process or edema. Electronically Signed   By: Jerilynn Mages.  Shick M.D.   On: 07/06/2020 11:55    Procedures Procedures   Medications Ordered in ED Medications  ondansetron (ZOFRAN-ODT) disintegrating tablet 4 mg (4 mg Oral Given 07/06/20 1051)    ED Course  I have reviewed the triage vital signs and the nursing notes.  Pertinent labs & imaging results that were available during my care of the patient were reviewed by me and considered in my medical decision making (see chart for details).    MDM Rules/Calculators/A&P                          Sachi Boulay is a 55 year old female with history of hypertension, pulmonary nodules who presents the ED with cough.  Chest x-ray showed no evidence of pneumonia.  Overall stable bilateral scarring in her lungs.  No acute pneumonia.  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  She has overall unremarkable vitals.  No hypoxia and no major signs of respiratory distress.  Will test for COVID.  Discharged in good condition understands return precautions.  This chart was dictated using voice recognition software.  Despite best efforts to proofread,  errors can occur which can change the documentation meaning.    Final Clinical Impression(s) / ED Diagnoses Final diagnoses:  Viral URI with cough    Rx / DC Orders ED Discharge Orders    None       Lennice Sites, DO 07/06/20 1200

## 2020-07-06 NOTE — ED Triage Notes (Signed)
pt arrives with c/o cough, body aches, and congestions with some nausea since Saturday. Pt has not tested for Covid, is vaccinated. Pt also reports she has not taken her BP medications for the past 2 days r/t nausea.

## 2020-07-06 NOTE — ED Notes (Signed)
ED Provider at bedside. 

## 2020-07-07 ENCOUNTER — Other Ambulatory Visit (HOSPITAL_COMMUNITY): Payer: Self-pay

## 2020-07-07 ENCOUNTER — Other Ambulatory Visit: Payer: Self-pay | Admitting: Adult Health

## 2020-07-07 DIAGNOSIS — U071 COVID-19: Secondary | ICD-10-CM

## 2020-07-07 MED ORDER — NIRMATRELVIR/RITONAVIR (PAXLOVID) TABLET (RENAL DOSING)
2.0000 | ORAL_TABLET | Freq: Two times a day (BID) | ORAL | 0 refills | Status: AC
  Filled 2020-07-07: qty 20, 5d supply, fill #0

## 2020-07-07 NOTE — Progress Notes (Signed)
Outpatient Oral COVID Treatment Note  I connected with [name] on 07/07/2020/1:36 PM by telephone and verified that I am speaking with the correct person using two identifiers.  I discussed the limitations, risks, security, and privacy concerns of performing an evaluation and management service by telephone and the availability of in person appointments. I also discussed with the patient that there may be a patient responsible charge related to this service. The patient expressed understanding and agreed to proceed.  Patient location: home Provider location: [facility]   Diagnosis: COVID-19 infection  Purpose of visit: Discussion of potential use of Molnupiravir or Paxlovid, a new treatment for mild to moderate COVID-19 viral infection in non-hospitalized patients.   Subjective: Patient is a 55 y.o. female who has been diagnosed with COVID 19 viral infection.  Their symptoms began on 07/03/2020 with congestion.    Past Medical History:  Diagnosis Date  . Dysrhythmia    ST/notes 12/10/2012  . History of cold sores   . Hypertension   . Multiple thyroid nodules   . Photosensitivity 04/17/2012   3/14 new   . Pneumomediastinum ([place])    Pneumomediastinum in the setting of progressive nodular pulmonary infiltrates with dermatologic vasculitis/notes 12/10/2012  . Pneumomediastinum ([place]) 03/03/2013  . Pneumonia 02/2012   "once" (12/10/2012)  . Pulmonary nodules/lesions, multiple 10/13/2012  . Rheumatoid arthritis ([place])    "legs; arms" (12/10/2012)  . 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 Benazepril Hcl     REACTION: cough  . Benicar [Olmesartan]     HA  . Tramadol     n/v  . Ciprofloxacin Rash    Joint pain REACTION: Rhabdomyolysis     Current Outpatient Medications:  .  amLODipine (NORVASC) 10 MG tablet, TAKE 1 TABLET(10 MG) BY MOUTH DAILY, Disp: 90 tablet,  Rfl: 3 .  budesonide-formoterol (SYMBICORT) 160-4.5 MCG/ACT inhaler, Inhale 2 puffs into the lungs 2 (two) times daily., Disp: 1 Inhaler, Rfl: 6 .  Cholecalciferol (VITAMIN D3) 1.25 MG (50000 UT) CAPS, Take 1 capsule by mouth every 30 (thirty) days., Disp: 3 capsule, Rfl: 3 .  hydroxychloroquine (PLAQUENIL) 200 MG tablet, Take 300-400 mg by mouth every other day. Alternating between 300mg  and 400mg  daily., Disp: , Rfl:  .  MINIVELLE 0.075 MG/24HR, , Disp: , Rfl: 1 .  mycophenolate (CELLCEPT) 500 MG tablet, Take 2 tablets by mouth 2 (two) times daily., Disp: , Rfl:  .  ondansetron (ZOFRAN) 4 MG tablet, Take 1 tablet (4 mg total) by mouth every 8 (eight) hours as needed for nausea or vomiting., Disp: 20 tablet, Rfl: 1 .  valACYclovir (VALTREX) 500 MG tablet, Take 1 tablet (500 mg total) by mouth 2 (two) times daily as needed., Disp: 14 tablet, Rfl: 2  Objective: Patient appears/sounds moderately well.  They are in no apparent distress.  Breathing is non labored.  Mood and behavior are normal.  Laboratory Data:  Recent Results (from the past 2160 hour(s))  SARS CORONAVIRUS 2 (TAT 6-24 HRS) Nasopharyngeal Nasopharyngeal Swab     Status: Abnormal   Collection Time: 07/06/20 10:53 AM   Specimen: Nasopharyngeal Swab  Result Value Ref Range   SARS Coronavirus 2 POSITIVE (A) NEGATIVE    Comment: (NOTE) SARS-CoV-2 target nucleic acids are DETECTED.  The SARS-CoV-2 RNA is generally detectable in upper and lower respiratory specimens during the acute phase of infection. Positive results  are indicative of the presence of SARS-CoV-2 RNA. Clinical correlation with patient history and other diagnostic information is  necessary to determine patient infection status. Positive results do not rule out bacterial infection or co-infection with other viruses.  The expected result is Negative.  Fact Sheet for Patients: SugarRoll.be  Fact Sheet for Healthcare  Providers: https://www.woods-mathews.com/  This test is not yet approved or cleared by the [place] FDA and  has been authorized for detection and/or diagnosis of SARS-CoV-2 by FDA under an Emergency Use Authorization (EUA). This EUA will remain  in effect (meaning this test can be used) for the duration of the COVID-19 declaration under Section 564(b)(1) of the Act, 21 U. S.C. section 360bbb-3(b)(1), unless the authorization is terminated or revoked sooner.   Performed at [facility], [place] [address], [place], [place] [address]      Assessment: 55 y.o. female with mild/moderate COVID 19 viral infection diagnosed on 07/06/2020 at high risk for progression to severe COVID 19.  Plan:  This patient is a 55 y.o. female that meets the following criteria for Emergency Use Authorization of: Paxlovid 1. Age >12 yr AND > 40 kg 2. SARS-COV-2 positive test 3. Symptom onset < 5 days 4. Mild-to-moderate COVID disease with high risk for severe progression to hospitalization or death  I have spoken and communicated the following to the patient or parent/caregiver regarding: 1. Paxlovid is an unapproved drug that is authorized for use under an Emergency Use Authorization.  2. There are no adequate, approved, available products for the treatment of COVID-19 in adults who have mild-to-moderate COVID-19 and are at high risk for progressing to severe COVID-19, including hospitalization or death. 3. Other therapeutics are currently authorized. For additional information on all products authorized for treatment or prevention of COVID-19, please see TanEmporium.pl.  4. There are benefits and risks of taking this treatment as outlined in the "Fact Sheet for Patients and Caregivers."  5. "Fact Sheet for Patients and Caregivers" was reviewed with patient. A hard copy will be provided  to patient from pharmacy prior to the patient receiving treatment. 6. Patients should continue to self-isolate and use infection control measures (e.g., wear mask, isolate, social distance, avoid sharing personal items, clean and disinfect "high touch" surfaces, and frequent handwashing) according to CDC guidelines.  7. The patient or parent/caregiver has the option to accept or refuse treatment. 8. Patient medication history was reviewed for potential drug interactions:Interaction with home meds: Amlodipine, recommended patient take 1/2 dose beginning tomorrow and resume normal dose after prescription is finished.  She is not taking Symbicort, and will not start it back during her Paxlovid treatment.  9. Patient's GFR was calculated to be 58, and they were therefore prescribed Reduced dose (GFR 30-60) - nirmatrelvir 150mg  tab (1 tablet) by mouth twice daily AND ritonavir 100mg  tab (1 tablet) by mouth twice daily   After reviewing above information with the patient, the patient agrees to receive Paxlovid.  Follow up instructions:    . Take prescription BID x 5 days as directed . Reach out to pharmacist for counseling on medication if desired . For concerns regarding further COVID symptoms please follow up with your PCP or urgent care . For urgent or life-threatening issues, seek care at your local emergency department  The patient was provided an opportunity to ask questions, and all were answered. The patient agreed with the plan and demonstrated an understanding of the instructions.   Script sent to [facility] and  opted to pick up RX.  The patient was advised to call their PCP or seek an in-person evaluation if the symptoms worsen or if the condition fails to improve as anticipated.   I provided 10 minutes of non face-to-face telephone visit time during this encounter, and > 50% was spent counseling as documented under my assessment & plan.  Scot Dock,  NP 07/07/2020 /1:36 PM

## 2020-07-14 ENCOUNTER — Telehealth: Payer: Self-pay | Admitting: Internal Medicine

## 2020-07-14 NOTE — Telephone Encounter (Signed)
Type of form received: FMLA  Additional comments:   Received by: Theodosia Quay  Form should be Faxed to: N/A Form should be mailed to:   N/A Is patient requesting call for pickup:   yes   Form placed in the Provider's box.  Attach charge sheet.  Provider will determine charge  Was patient informed of  7-10 business day turn around (Y/N)?   n/a

## 2020-07-20 NOTE — Telephone Encounter (Signed)
Called pt to get more information regarding her FMLA paperwork. Pt will notified when forms have been completed and reviewed by the provider.

## 2020-07-21 DIAGNOSIS — Z0279 Encounter for issue of other medical certificate: Secondary | ICD-10-CM

## 2020-07-21 NOTE — Telephone Encounter (Signed)
FMLA  Forms are completed and placed on provider's desk in blue folder for review and signature.

## 2020-07-22 NOTE — Telephone Encounter (Signed)
Notified pt FMLA forms are complete. There is a $29 charge for forms. Pt states she will come tomorrow to pick-up.Marland KitchenJohny Chess

## 2020-08-27 ENCOUNTER — Ambulatory Visit
Admission: RE | Admit: 2020-08-27 | Discharge: 2020-08-27 | Disposition: A | Discharge status: Home / Self Care | Payer: 59 | Source: Ambulatory Visit | Attending: Obstetrics and Gynecology | Admitting: Obstetrics and Gynecology

## 2020-08-27 ENCOUNTER — Other Ambulatory Visit: Payer: Self-pay

## 2020-08-27 DIAGNOSIS — Z1231 Encounter for screening mammogram for malignant neoplasm of breast: Secondary | ICD-10-CM

## 2021-03-25 ENCOUNTER — Ambulatory Visit: Payer: 59 | Admitting: Internal Medicine

## 2021-04-04 ENCOUNTER — Other Ambulatory Visit: Payer: Self-pay

## 2021-04-04 ENCOUNTER — Ambulatory Visit: Payer: 59 | Admitting: Internal Medicine

## 2021-04-04 ENCOUNTER — Encounter: Payer: Self-pay | Admitting: Internal Medicine

## 2021-04-04 VITALS — BP 156/88 | HR 86 | Temp 98.1°F | Ht 67.0 in | Wt 162.4 lb

## 2021-04-04 DIAGNOSIS — D751 Secondary polycythemia: Secondary | ICD-10-CM | POA: Diagnosis not present

## 2021-04-04 DIAGNOSIS — I10 Essential (primary) hypertension: Secondary | ICD-10-CM | POA: Diagnosis not present

## 2021-04-04 DIAGNOSIS — M0520 Rheumatoid vasculitis with rheumatoid arthritis of unspecified site: Secondary | ICD-10-CM

## 2021-04-04 DIAGNOSIS — Z23 Encounter for immunization: Secondary | ICD-10-CM

## 2021-04-04 MED ORDER — AMLODIPINE BESYLATE 10 MG PO TABS: ORAL_TABLET | ORAL | 3 refills | Status: DC

## 2021-04-04 NOTE — Assessment & Plan Note (Signed)
Monitor CBC 

## 2021-04-04 NOTE — Progress Notes (Signed)
Subjective:  Patient ID: Jamie [name], female    DOB: 14-May-1965  Age: 56 y.o. MRN: [id number]  CC: medication managament  (BP med)   HPI Jamie [name] presents for HTN, vasculitis - seeing Dr Ang in [place] since May. Working 60 h/wk F/u HTN, anxiety SBP 130-140 at home  Outpatient Medications Prior to Visit  Medication Sig Dispense Refill   budesonide-formoterol (SYMBICORT) 160-4.5 MCG/ACT inhaler Inhale 2 puffs into the lungs 2 (two) times daily. 1 Inhaler 6   Cholecalciferol (VITAMIN D3) 1.25 MG (50000 UT) CAPS Take 1 capsule by mouth every 30 (thirty) days. 3 capsule 3   hydroxychloroquine (PLAQUENIL) 200 MG tablet Take 300-400 mg by mouth every other day. Alternating between 300mg  and 400mg  daily.     MINIVELLE 0.075 MG/24HR   1   mycophenolate (CELLCEPT) 500 MG tablet Take 2 tablets by mouth 2 (two) times daily.     ondansetron (ZOFRAN) 4 MG tablet Take 1 tablet (4 mg total) by mouth every 8 (eight) hours as needed for nausea or vomiting. 20 tablet 1   valACYclovir (VALTREX) 500 MG tablet Take 1 tablet (500 mg total) by mouth 2 (two) times daily as needed. 14 tablet 2   amLODipine (NORVASC) 10 MG tablet TAKE 1 TABLET(10 MG) BY MOUTH DAILY 90 tablet 3   No facility-administered medications prior to visit.    ROS: Review of Systems  Constitutional:  Negative for activity change, appetite change, chills, fatigue and unexpected weight change.  HENT:  Negative for congestion, mouth sores and sinus pressure.   Eyes:  Negative for visual disturbance.  Respiratory:  Negative for cough and chest tightness.   Gastrointestinal:  Negative for abdominal pain and nausea.  Genitourinary:  Negative for difficulty urinating, frequency and vaginal pain.  Musculoskeletal:  Negative for back pain and gait problem.  Skin:  Negative for pallor and rash.  Neurological:  Negative for dizziness, tremors, weakness, numbness and headaches.  Psychiatric/Behavioral:   Negative for confusion and sleep disturbance.    Objective:  BP (!) 156/88    Pulse 86    Temp 98.1 F (36.7 C)    Ht 5\' 7"  (1.702 m)    Wt 162 lb 6 oz (73.7 kg)    SpO2 98%    BMI 25.43 kg/m   BP Readings from Last 3 Encounters:  04/04/21 (!) 156/88  07/06/20 (!) 150/97  05/13/20 (!) 156/94    Wt Readings from Last 3 Encounters:  04/04/21 162 lb 6 oz (73.7 kg)  07/06/20 160 lb (72.6 kg)  05/13/20 167 lb (75.8 kg)    Physical Exam Constitutional:      General: She is not in acute distress.    Appearance: She is well-developed. She is obese.  HENT:     Head: Normocephalic.     Right Ear: External ear normal.     Left Ear: External ear normal.     Nose: Nose normal.  Eyes:     General:        Right eye: No discharge.        Left eye: No discharge.     Conjunctiva/sclera: Conjunctivae normal.     Pupils: Pupils are equal, round, and reactive to light.  Neck:     Thyroid: No thyromegaly.     Vascular: No JVD.     Trachea: No tracheal deviation.  Cardiovascular:     Rate and Rhythm: Normal rate and regular rhythm.     Heart sounds: Normal  heart sounds.  Pulmonary:     Effort: No respiratory distress.     Breath sounds: No stridor. No wheezing.  Abdominal:     General: Bowel sounds are normal. There is no distension.     Palpations: Abdomen is soft. There is no mass.     Tenderness: There is no abdominal tenderness. There is no guarding or rebound.  Musculoskeletal:        General: No tenderness.     Cervical back: Normal range of motion and neck supple. No rigidity.  Lymphadenopathy:     Cervical: No cervical adenopathy.  Skin:    Findings: No erythema or rash.  Neurological:     Cranial Nerves: No cranial nerve deficit.     Motor: No abnormal muscle tone.     Coordination: Coordination normal.     Gait: Gait normal.     Deep Tendon Reflexes: Reflexes normal.  Psychiatric:        Behavior: Behavior normal.        Thought Content: Thought content normal.         Judgment: Judgment normal.    Lab Results  Component Value Date   WBC 14.2 (H) 07/03/2015   HGB 11.0 (L) 07/03/2015   HCT 35.0 (L) 07/03/2015   PLT 233 07/03/2015   GLUCOSE 103 (H) 07/03/2015   CHOL 152 10/27/2010   TRIG 35.0 10/27/2010   HDL 50.80 10/27/2010   LDLCALC 94 10/27/2010   ALT 11 (L) 07/03/2015   AST 15 07/03/2015   NA 140 07/03/2015   K 3.6 07/03/2015   CL 103 07/03/2015   CREATININE 0.56 07/03/2015   BUN 5 (L) 07/03/2015   CO2 26 07/03/2015   TSH 0.659 06/28/2015   INR 1.10 06/30/2015   HGBA1C 5.9 (H) 10/14/2012    MM 3D SCREEN BREAST BILATERAL  Result Date: 08/30/2020 CLINICAL DATA:  Screening. EXAM: DIGITAL SCREENING BILATERAL MAMMOGRAM WITH TOMOSYNTHESIS AND CAD TECHNIQUE: Bilateral screening digital craniocaudal and mediolateral oblique mammograms were obtained. Bilateral screening digital breast tomosynthesis was performed. The images were evaluated with computer-aided detection. COMPARISON:  Previous exam(s). ACR Breast Density Category c: The breast tissue is heterogeneously dense, which may obscure small masses. FINDINGS: There are no findings suspicious for malignancy. IMPRESSION: No mammographic evidence of malignancy. A result letter of this screening mammogram will be mailed directly to the patient. RECOMMENDATION: Screening mammogram in one year. (Code:SM-B-01Y) BI-RADS CATEGORY  1: Negative. Electronically Signed   By: Lillia Mountain M.D.   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)    [place] Dr D Ang in [place] On Cellcept, Plaquenil      Relevant Medications   amLODipine (NORVASC) 10 MG tablet   Essential hypertension    Cont on Amlodipine SBP 130-140 at home      Relevant Medications   amLODipine (NORVASC) 10 MG tablet   Polycythemia    Monitor CBC      Other Visit Diagnoses     Need for vaccination    -  Primary   Relevant Orders    Varicella-zoster vaccine IM (Shingrix)         Meds ordered this encounter  Medications   amLODipine (NORVASC) 10 MG tablet    Sig: TAKE 1 TABLET(10 MG) BY MOUTH DAILY    Dispense:  90 tablet    Refill:  3      Follow-up:  No follow-ups on file.  Walker Kehr, MD

## 2021-04-04 NOTE — Assessment & Plan Note (Addendum)
Cont on Amlodipine SBP 130-140 at home

## 2021-04-04 NOTE — Assessment & Plan Note (Signed)
Seeing Dr D Ang in [place] On Cellcept, Plaquenil

## 2021-05-19 ENCOUNTER — Other Ambulatory Visit: Payer: Self-pay | Admitting: Internal Medicine

## 2021-07-01 ENCOUNTER — Other Ambulatory Visit: Payer: Self-pay | Admitting: Internal Medicine

## 2021-07-01 ENCOUNTER — Telehealth: Payer: Self-pay

## 2021-07-01 NOTE — Telephone Encounter (Signed)
NOTE NOT NEEDED ?

## 2021-07-22 ENCOUNTER — Ambulatory Visit (INDEPENDENT_AMBULATORY_CARE_PROVIDER_SITE_OTHER): Payer: 59

## 2021-07-22 DIAGNOSIS — Z23 Encounter for immunization: Secondary | ICD-10-CM | POA: Diagnosis not present

## 2021-08-09 ENCOUNTER — Emergency Department (HOSPITAL_BASED_OUTPATIENT_CLINIC_OR_DEPARTMENT_OTHER)
Admission: EM | Admit: 2021-08-09 | Discharge: 2021-08-09 | Discharge status: Against Medical Advice | Payer: 59 | Attending: Emergency Medicine | Admitting: Emergency Medicine

## 2021-08-09 ENCOUNTER — Encounter (HOSPITAL_BASED_OUTPATIENT_CLINIC_OR_DEPARTMENT_OTHER): Payer: Self-pay

## 2021-08-09 DIAGNOSIS — L723 Sebaceous cyst: Secondary | ICD-10-CM | POA: Insufficient documentation

## 2021-08-09 DIAGNOSIS — Z5321 Procedure and treatment not carried out due to patient leaving prior to being seen by health care provider: Secondary | ICD-10-CM | POA: Insufficient documentation

## 2021-08-10 ENCOUNTER — Ambulatory Visit: Payer: 59 | Admitting: Internal Medicine

## 2021-08-10 ENCOUNTER — Encounter: Payer: Self-pay | Admitting: Internal Medicine

## 2021-08-10 VITALS — BP 120/80 | HR 104 | Ht 67.0 in | Wt 161.0 lb

## 2021-08-10 DIAGNOSIS — I10 Essential (primary) hypertension: Secondary | ICD-10-CM

## 2021-08-10 DIAGNOSIS — L723 Sebaceous cyst: Secondary | ICD-10-CM | POA: Diagnosis not present

## 2021-08-10 DIAGNOSIS — M351 Other overlap syndromes: Secondary | ICD-10-CM

## 2021-08-10 DIAGNOSIS — Z7952 Long term (current) use of systemic steroids: Secondary | ICD-10-CM | POA: Diagnosis not present

## 2021-08-10 MED ORDER — DOXYCYCLINE HYCLATE 100 MG PO TABS: 100.0000 mg | ORAL_TABLET | Freq: Two times a day (BID) | ORAL | 0 refills | Status: DC

## 2021-08-10 NOTE — Progress Notes (Signed)
y

## 2021-08-13 ENCOUNTER — Encounter: Payer: Self-pay | Admitting: Internal Medicine

## 2021-08-13 NOTE — Assessment & Plan Note (Signed)
No longer taking prednisone,  to f/u any worsening symptoms or concerns

## 2021-08-13 NOTE — Assessment & Plan Note (Addendum)
Mild to mod, for antibx course- doxycycine 100 bid, also for f/u plastic surgury appt 7/19,  to f/u any worsening symptoms or concerns

## 2021-08-13 NOTE — Progress Notes (Signed)
Patient ID: Jamie [name], female   DOB: 1965-10-21, 56 y.o.   MRN: [id number]        Chief Complaint: follow up left back infected cyst, htn on chronic cellcept       HPI:  Jamie [name] is a 56 y.o. female here with c/o sebacous cysts to left lower back area for some months, and has appt with plastic surgury July 19, but unfortunately now with red, tedner, swelling and new drainage x 3 days. No high fever, chills, red streaks.  Pt denies chest pain, increased sob or doe, wheezing, orthopnea, PND, increased LE swelling, palpitations, dizziness or syncope.   Pt denies polydipsia, polyuria, or new focal neuro s/s.   No longer on prednisone, but on cellcept with immune suppression.         Wt Readings from Last 3 Encounters:  08/10/21 161 lb (73 kg)  08/09/21 160 lb (72.6 kg)  04/04/21 162 lb 6 oz (73.7 kg)   BP Readings from Last 3 Encounters:  08/10/21 120/80  08/09/21 (!) 190/104  04/04/21 (!) 156/88         Past Medical History:  Diagnosis Date   Dysrhythmia    ST/notes 12/10/2012   History of cold sores    Hypertension    Multiple thyroid nodules    Photosensitivity 04/17/2012   3/14 new    Pneumomediastinum ([place])    Pneumomediastinum in the setting of progressive nodular pulmonary infiltrates with dermatologic vasculitis/notes 12/10/2012   Pneumomediastinum ([place]) 03/03/2013   Pneumonia 02/2012   "once" (12/10/2012)   Pulmonary nodules/lesions, multiple 10/13/2012   Rheumatoid arthritis ([place])    "legs; arms" (12/10/2012)   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    Past Surgical History:  Procedure Laterality Date   ABDOMINAL HYSTERECTOMY  ~ 2006   LUNG BIOPSY Left 10/18/2012   Procedure: LUNG BIOPSY;  Surgeon: Rexene Alberts, MD;  Location: [place];  Service: Thoracic;  Laterality: Left;   [place] Left 10/18/2012   Procedure: VIDEO ASSISTED THORACOSCOPY;  Surgeon:  Rexene Alberts, MD;  Location: [place];  Service: Thoracic;  Laterality: Left;   [place] (VATS)/DECORTICATION Left 07/01/2015   Procedure: VIDEO ASSISTED THORACOSCOPY (VATS)/DECORTICATION;  Surgeon: Melrose Nakayama, MD;  Location: [facility];  Service: Thoracic;  Laterality: Left;   VIDEO BRONCHOSCOPY Left 10/18/2012   Procedure: VIDEO BRONCHOSCOPY;  Surgeon: Rexene Alberts, MD;  Location: [place];  Service: Thoracic;  Laterality: Left;    reports that she has never smoked. She has never used smokeless tobacco. She reports that she does not drink alcohol and does not use drugs. family history includes Cancer (age of onset: 59) in her father; Hypertension in her mother and another family member. Allergies  Allergen Reactions   Benazepril Hcl     REACTION: cough   Benicar [Olmesartan]     HA   Tramadol     n/v   Ciprofloxacin Rash    Joint pain REACTION: Rhabdomyolysis   Current Outpatient Medications on File Prior to Visit  Medication Sig Dispense Refill   amLODipine (NORVASC) 10 MG tablet TAKE 1 TABLET(10 MG) BY MOUTH DAILY 90 tablet 3   budesonide-formoterol (SYMBICORT) 160-4.5 MCG/ACT inhaler Inhale 2 puffs into the lungs 2 (two) times daily. 1 Inhaler 6   Cholecalciferol (VITAMIN D3) 1.25 MG (50000 UT) CAPS Take 1 capsule by mouth every 30 (thirty) days. 3  capsule 3   hydroxychloroquine (PLAQUENIL) 200 MG tablet Take 300-400 mg by mouth every other day. Alternating between '300mg'$  and '400mg'$  daily.     MINIVELLE 0.075 MG/24HR   1   mycophenolate (CELLCEPT) 500 MG tablet Take 2 tablets by mouth 2 (two) times daily.     ondansetron (ZOFRAN) 4 MG tablet Take 1 tablet (4 mg total) by mouth every 8 (eight) hours as needed for nausea or vomiting. 20 tablet 1   valACYclovir (VALTREX) 500 MG tablet TAKE 1 TABLET(500 MG) BY MOUTH TWICE DAILY AS NEEDED 14 tablet 2   No current facility-administered medications on file prior to visit.        ROS:  All others reviewed and  negative.  Objective        PE:  BP 120/80 (BP Location: Right Arm, Patient Position: Sitting, Cuff Size: Large)   Pulse (!) 104   Ht '5\' 7"'$  (1.702 m)   Wt 161 lb (73 kg)   SpO2 99%   BMI 25.22 kg/m                 Constitutional: Pt appears in NAD               HENT: Head: NCAT.                Right Ear: External ear normal.                 Left Ear: External ear normal.                Eyes: . Pupils are equal, round, and reactive to light. Conjunctivae and EOM are normal               Nose: without d/c or deformity               Neck: Neck supple. Gross normal ROM               Cardiovascular: Normal rate and regular rhythm.                 Pulmonary/Chest: Effort normal and breath sounds without rales or wheezing.                               Neurological: Pt is alert. At baseline orientation, motor grossly intact               Skin:  LE edema - none, left lower mid back with 1.cm area red, tender, swelling cyst with colored drainage              Psychiatric: Pt behavior is normal without agitation   Micro: none  Cardiac tracings I have personally interpreted today:  none  Pertinent Radiological findings (summarize): none   Lab Results  Component Value Date   WBC 14.2 (H) 07/03/2015   HGB 11.0 (L) 07/03/2015   HCT 35.0 (L) 07/03/2015   PLT 233 07/03/2015   GLUCOSE 103 (H) 07/03/2015   CHOL 152 10/27/2010   TRIG 35.0 10/27/2010   HDL 50.80 10/27/2010   LDLCALC 94 10/27/2010   ALT 11 (L) 07/03/2015   AST 15 07/03/2015   NA 140 07/03/2015   K 3.6 07/03/2015   CL 103 07/03/2015   CREATININE 0.56 07/03/2015   BUN 5 (L) 07/03/2015   CO2 26 07/03/2015   TSH 0.659 06/28/2015   INR 1.10 06/30/2015   HGBA1C 5.9 (H) 10/14/2012  Assessment/Plan:  Jamie [name] is a 56 y.o. 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  SEBACEOUS CYST, INFECTED Mild to mod, for antibx course- doxycycine 100 bid, also for f/u plastic surgury appt 7/19,  to f/u any worsening symptoms or concerns  Essential hypertension BP Readings from Last 3 Encounters:  08/10/21 120/80  08/09/21 (!) 190/104  04/04/21 (!) 156/88   Stable, pt to continue medical treatment norvasc 10 qd   Current chronic use of systemic steroids No longer taking prednisone,  to f/u any worsening symptoms or concerns  Connective tissue disease overlap syndrome (HCC) Pt otherwise overall stable on cellcept  Followup: Return if symptoms worsen or fail to improve.  Cathlean Cower, MD 08/13/2021 3:41 PM [facility]

## 2021-08-13 NOTE — Patient Instructions (Signed)
Please take all new medication as prescribed 

## 2021-08-13 NOTE — Assessment & Plan Note (Signed)
BP Readings from Last 3 Encounters:  08/10/21 120/80  08/09/21 (!) 190/104  04/04/21 (!) 156/88   Stable, pt to continue medical treatment norvasc 10 qd

## 2021-08-13 NOTE — Assessment & Plan Note (Signed)
Pt otherwise overall stable on cellcept

## 2021-08-21 ENCOUNTER — Encounter: Payer: Self-pay | Admitting: Plastic Surgery

## 2021-08-22 ENCOUNTER — Other Ambulatory Visit: Payer: Self-pay | Admitting: Obstetrics and Gynecology

## 2021-08-22 DIAGNOSIS — Z1231 Encounter for screening mammogram for malignant neoplasm of breast: Secondary | ICD-10-CM

## 2021-08-31 ENCOUNTER — Institutional Professional Consult (permissible substitution): Payer: 59 | Admitting: Plastic Surgery

## 2021-09-13 ENCOUNTER — Ambulatory Visit
Admission: RE | Admit: 2021-09-13 | Discharge: 2021-09-13 | Disposition: A | Discharge status: Home / Self Care | Payer: 59 | Source: Ambulatory Visit | Attending: Obstetrics and Gynecology | Admitting: Obstetrics and Gynecology

## 2021-09-13 DIAGNOSIS — Z1231 Encounter for screening mammogram for malignant neoplasm of breast: Secondary | ICD-10-CM

## 2021-09-15 ENCOUNTER — Other Ambulatory Visit: Payer: Self-pay | Admitting: Obstetrics and Gynecology

## 2021-09-15 DIAGNOSIS — R928 Other abnormal and inconclusive findings on diagnostic imaging of breast: Secondary | ICD-10-CM

## 2021-09-16 ENCOUNTER — Institutional Professional Consult (permissible substitution): Payer: 59 | Admitting: Plastic Surgery

## 2021-09-22 ENCOUNTER — Ambulatory Visit
Admission: RE | Admit: 2021-09-22 | Discharge: 2021-09-22 | Disposition: A | Discharge status: Home / Self Care | Payer: 59 | Source: Ambulatory Visit | Attending: Obstetrics and Gynecology | Admitting: Obstetrics and Gynecology

## 2021-09-22 ENCOUNTER — Other Ambulatory Visit: Payer: Self-pay | Admitting: Obstetrics and Gynecology

## 2021-09-22 DIAGNOSIS — R928 Other abnormal and inconclusive findings on diagnostic imaging of breast: Secondary | ICD-10-CM

## 2021-09-22 DIAGNOSIS — R599 Enlarged lymph nodes, unspecified: Secondary | ICD-10-CM

## 2021-09-27 ENCOUNTER — Ambulatory Visit
Admission: RE | Admit: 2021-09-27 | Discharge: 2021-09-27 | Disposition: A | Discharge status: Home / Self Care | Payer: 59 | Source: Ambulatory Visit | Attending: Obstetrics and Gynecology | Admitting: Obstetrics and Gynecology

## 2021-09-27 DIAGNOSIS — R599 Enlarged lymph nodes, unspecified: Secondary | ICD-10-CM

## 2021-09-27 DIAGNOSIS — M351 Other overlap syndromes: Secondary | ICD-10-CM

## 2021-10-25 ENCOUNTER — Encounter: Payer: Self-pay | Admitting: Plastic Surgery

## 2021-10-25 ENCOUNTER — Ambulatory Visit: Payer: 59 | Admitting: Plastic Surgery

## 2021-10-25 DIAGNOSIS — L723 Sebaceous cyst: Secondary | ICD-10-CM | POA: Diagnosis not present

## 2021-10-25 NOTE — Progress Notes (Signed)
Patient ID: Jamie [name], female    DOB: 01/29/1966, 56 y.o.   MRN: [id number]   Chief Complaint  Patient presents with   Advice Only    The patient is a 56 yrs old female here for evaluation of a lesion on her back.  She works in the ED at [employer].  She noticed a mass on her back. It was red and inflamed.  It was very painful and large. She started on antibiotics which was a big help and resolved the redness and pain.  She is aware that the core of the cyst is still present and visible.     Review of Systems  Constitutional: Negative.   HENT: Negative.    Eyes: Negative.   Respiratory: Negative.    Cardiovascular: Negative.   Gastrointestinal: Negative.   Endocrine: Negative.   Genitourinary: Negative.   Musculoskeletal: Negative.   Skin: Negative.     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     Past Surgical History:  Procedure Laterality Date   ABDOMINAL HYSTERECTOMY  ~ 2006   LUNG BIOPSY Left 10/18/2012   Procedure: LUNG BIOPSY;  Surgeon: Rexene Alberts, MD;  Location: [place];  Service: Thoracic;  Laterality: Left;   VIDEO ASSISTED THORACOSCOPY Left 10/18/2012   Procedure: VIDEO ASSISTED THORACOSCOPY;  Surgeon: Rexene Alberts, MD;  Location: [place];  Service: Thoracic;  Laterality: Left;   [place] (VATS)/DECORTICATION Left 07/01/2015   Procedure:  VIDEO ASSISTED THORACOSCOPY (VATS)/DECORTICATION;  Surgeon: Melrose Nakayama, MD;  Location: [place];  Service: Thoracic;  Laterality: Left;   VIDEO BRONCHOSCOPY Left 10/18/2012   Procedure: VIDEO BRONCHOSCOPY;  Surgeon: Rexene Alberts, MD;  Location: [place];  Service: Thoracic;  Laterality: Left;      Current Outpatient Medications:    alendronate (FOSAMAX) 70 MG tablet, Take 70 mg by mouth once a week., Disp: , Rfl:    amLODipine (NORVASC) 10 MG tablet, TAKE 1 TABLET(10 MG) BY MOUTH DAILY, Disp: 90 tablet, Rfl: 3   budesonide-formoterol (SYMBICORT) 160-4.5 MCG/ACT inhaler, Inhale 2 puffs into the lungs 2 (two) times daily., Disp: 1 Inhaler, Rfl: 6   Cholecalciferol (VITAMIN D3) 1.25 MG (50000 UT) CAPS, Take 1 capsule by mouth every 30 (thirty) days., Disp: 3 capsule, Rfl: 3   estradiol (VIVELLE-DOT) 0.1 MG/24HR patch, 1 patch 2 (two) times a week., Disp: , Rfl:    hydroxychloroquine (PLAQUENIL) 200 MG tablet, Take 300-400 mg by mouth every other day. Alternating between '300mg'$  and '400mg'$  daily., Disp: , Rfl:    MINIVELLE 0.075 MG/24HR, , Disp: , Rfl: 1   mycophenolate (CELLCEPT) 500 MG tablet, Take 2 tablets by mouth 2 (two) times daily., Disp: , Rfl:    ondansetron (ZOFRAN) 4 MG tablet, Take 1 tablet (4 mg total) by mouth every 8 (eight) hours as needed for nausea or vomiting., Disp:  20 tablet, Rfl: 1   valACYclovir (VALTREX) 500 MG tablet, TAKE 1 TABLET(500 MG) BY MOUTH TWICE DAILY AS NEEDED, Disp: 14 tablet, Rfl: 2   Objective:   There were no vitals filed for this visit.  Physical Exam Vitals reviewed.  Constitutional:      Appearance: Normal appearance.  HENT:     Head: Normocephalic.  Cardiovascular:     Rate and Rhythm: Normal rate.     Pulses: Normal pulses.  Pulmonary:     Effort: Pulmonary effort is normal.  Abdominal:     General: There is no distension.     Palpations: Abdomen is soft.  Musculoskeletal:        General: No swelling or deformity.  Skin:    General:  Skin is warm.     Capillary Refill: Capillary refill takes less than 2 seconds.     Coloration: Skin is not jaundiced.  Neurological:     Mental Status: She is alert and oriented to person, place, and time.  Psychiatric:        Mood and Affect: Mood normal.        Behavior: Behavior normal.        Thought Content: Thought content normal.     Assessment & Plan:  SEBACEOUS CYST, INFECTED  Back sebaceous cyst - plan for excision in the office.  Pictures were obtained of the patient and placed in the chart with the patient's or guardian's permission.   [place], DO

## 2021-11-08 ENCOUNTER — Ambulatory Visit: Payer: 59 | Admitting: Plastic Surgery

## 2021-11-08 ENCOUNTER — Encounter: Payer: Self-pay | Admitting: Plastic Surgery

## 2021-11-08 VITALS — BP 171/91 | HR 89 | Ht 68.0 in | Wt 166.0 lb

## 2021-11-08 DIAGNOSIS — L723 Sebaceous cyst: Secondary | ICD-10-CM | POA: Diagnosis not present

## 2021-11-08 NOTE — Progress Notes (Signed)
Procedure Note  Preoperative Dx: Sebaceous cyst of back  Postoperative Dx: Same  Procedure: Excision of sebaceous cyst of back 2 cm  Anesthesia: Lidocaine 1% with 1:100,000 epinephrine   Description of Procedure: Risks and complications were explained to the patient.  Consent was confirmed and the patient understands the risks and benefits.  The potential complications and alternatives were explained and the patient consents.  The patient expressed understanding the option of not having the procedure and the risks of a scar.  Time out was called and all information was confirmed to be correct.    The area was prepped and drapped.  Lidocaine 1% with epinepherine was injected in the subcutaneous area.  After waiting several minutes for the local to take affect a #15 blade was used to excise the area in an eliptical pattern.  The bovie was used for hemostasis.  A 4-0 Monocryl was used to close the skin with vertical mattress sutures. A dressing was applied.  The patient was given instructions on how to care for the area and a follow up appointment.  Jamie [name] tolerated the procedure well and there were no complications. The specimen was sent to pathology.

## 2021-11-09 ENCOUNTER — Other Ambulatory Visit (HOSPITAL_COMMUNITY)
Admission: RE | Admit: 2021-11-09 | Discharge: 2021-11-09 | Disposition: A | Discharge status: Home / Self Care | Payer: 59 | Source: Ambulatory Visit | Attending: Plastic Surgery | Admitting: Plastic Surgery

## 2021-11-09 ENCOUNTER — Telehealth: Payer: Self-pay | Admitting: Plastic Surgery

## 2021-11-09 DIAGNOSIS — L723 Sebaceous cyst: Secondary | ICD-10-CM | POA: Diagnosis not present

## 2021-11-09 MED ORDER — CEPHALEXIN 500 MG PO CAPS: 500.0000 mg | ORAL_CAPSULE | Freq: Four times a day (QID) | ORAL | 0 refills | Status: AC

## 2021-11-09 NOTE — Addendum Note (Signed)
Addended by: Wallace Going on: 11/09/2021 03:23 PM   Modules accepted: Orders

## 2021-11-09 NOTE — Telephone Encounter (Signed)
Pt is calling in to see if the ABX can be sent in to the pharmacy on file.  She was in the office for a procedure and the provider stated that she was going to send it in an she contacted her pharmacy and they still do not have it and would like to see if it can be called in today.  Pharm:  [facility] in [place] on [address] or [address] (not sure of the correct address since the just moved). Pt would like to have a call back once it has been called in.

## 2021-11-09 NOTE — Addendum Note (Signed)
Addended by: Wallace Going on: 11/09/2021 01:01 PM   Modules accepted: Orders

## 2021-11-14 LAB — SURGICAL PATHOLOGY

## 2021-11-21 ENCOUNTER — Ambulatory Visit: Payer: 59 | Admitting: Physician Assistant

## 2021-11-21 ENCOUNTER — Encounter: Payer: Self-pay | Admitting: Physician Assistant

## 2021-11-21 DIAGNOSIS — Z86018 Personal history of other benign neoplasm: Secondary | ICD-10-CM

## 2021-11-21 DIAGNOSIS — Z9889 Other specified postprocedural states: Secondary | ICD-10-CM

## 2021-11-21 NOTE — Progress Notes (Deleted)
Patient is a 56 year old female with history of a sebaceous cyst on her back. Patient underwent excision of the sebaceous cyst with Dr. Marla Roe in the clinic on 11/08/21. Per the procedure note, a 4-0 monocryl was used to close the skin with vertical mattress sutures. Patient is approximately 2 weeks out from her procedure. Patient presents to the clinic today for postprocedural follow up.   Surgical pathology is consistent with a ruptured epidermal inclusion cyst.   Today,

## 2021-11-21 NOTE — Progress Notes (Signed)
Patient is a pleasant 56 year old female with PMH of subcutaneous mass on upper back s/p excision performed in office on 11/08/2021 by Dr. Marla Roe who presents to clinic for postprocedural follow-up.  Reviewed the procedural note and 4-0 Monocryl was used for closure.  Surgical pathology is consistent with a ruptured epidermal inclusion cyst, no atypia.   Today, patient is doing well.  No complaints.  States that it is healing fine.  She reports that she has not put anything on the excision site since the procedure.  Excision site appears to be healing well.  Mildly dry, some superficial scabbing noted.  No erythema or cellulitic changes noted.  Nontender.  Removed the scattered sutures without difficulty or complication.  Applied thin film of Vaseline.  No specific follow-up needed.  Discussed scar mitigation gels and tapes.  She can call the clinic should she have any questions or concerns.  Picture(s) obtained of the patient and placed in the chart were with the patient's or guardian's permission.

## 2022-02-21 ENCOUNTER — Ambulatory Visit: Payer: 59 | Admitting: Internal Medicine

## 2022-02-21 ENCOUNTER — Encounter: Payer: Self-pay | Admitting: Internal Medicine

## 2022-02-21 VITALS — BP 138/82 | HR 95 | Temp 97.8°F | Ht 68.0 in | Wt 171.0 lb

## 2022-02-21 DIAGNOSIS — Z0000 Encounter for general adult medical examination without abnormal findings: Secondary | ICD-10-CM

## 2022-02-21 DIAGNOSIS — R053 Chronic cough: Secondary | ICD-10-CM

## 2022-02-21 MED ORDER — HYDROCOD POLI-CHLORPHE POLI ER 10-8 MG/5ML PO SUER: 5.0000 mL | Freq: Two times a day (BID) | ORAL | 0 refills | Status: DC | PRN

## 2022-02-21 MED ORDER — VALACYCLOVIR HCL 500 MG PO TABS: ORAL_TABLET | ORAL | 2 refills | Status: DC

## 2022-02-21 MED ORDER — AMLODIPINE BESYLATE 10 MG PO TABS: ORAL_TABLET | ORAL | 3 refills | Status: DC

## 2022-02-21 MED ORDER — ONDANSETRON HCL 4 MG PO TABS: 4.0000 mg | ORAL_TABLET | Freq: Three times a day (TID) | ORAL | 1 refills | Status: DC | PRN

## 2022-02-21 NOTE — Progress Notes (Signed)
Subjective:  Patient ID: Jamie [name], female    DOB: 1966-02-08  Age: 57 y.o. MRN: [id number]  CC: Follow-up   HPI Jamie [name] presents for a well exam C/o chronic cough Labs w/Rheumatology  Outpatient Medications Prior to Visit  Medication Sig Dispense Refill   alendronate (FOSAMAX) 70 MG tablet Take 70 mg by mouth once a week.     budesonide-formoterol (SYMBICORT) 160-4.5 MCG/ACT inhaler Inhale 2 puffs into the lungs 2 (two) times daily. 1 Inhaler 6   Cholecalciferol (VITAMIN D3) 1.25 MG (50000 UT) CAPS Take 1 capsule by mouth every 30 (thirty) days. 3 capsule 3   estradiol (VIVELLE-DOT) 0.1 MG/24HR patch 1 patch 2 (two) times a week.     hydroxychloroquine (PLAQUENIL) 200 MG tablet Take 300-400 mg by mouth every other day. Alternating between '300mg'$  and '400mg'$  daily.     MINIVELLE 0.075 MG/24HR   1   mycophenolate (CELLCEPT) 500 MG tablet Take 2 tablets by mouth 2 (two) times daily.     amLODipine (NORVASC) 10 MG tablet TAKE 1 TABLET(10 MG) BY MOUTH DAILY 90 tablet 3   ondansetron (ZOFRAN) 4 MG tablet Take 1 tablet (4 mg total) by mouth every 8 (eight) hours as needed for nausea or vomiting. 20 tablet 1   valACYclovir (VALTREX) 500 MG tablet TAKE 1 TABLET(500 MG) BY MOUTH TWICE DAILY AS NEEDED 14 tablet 2   No facility-administered medications prior to visit.    ROS: Review of Systems  Constitutional:  Negative for activity change, appetite change, chills, fatigue and unexpected weight change.  HENT:  Negative for congestion, mouth sores and sinus pressure.   Eyes:  Negative for visual disturbance.  Respiratory:  Positive for cough. Negative for chest tightness.   Gastrointestinal:  Negative for abdominal pain and nausea.  Genitourinary:  Negative for difficulty urinating, frequency and vaginal pain.  Musculoskeletal:  Negative for back pain and gait problem.  Skin:  Negative for pallor and rash.  Neurological:  Negative for dizziness, tremors, weakness,  numbness and headaches.  Psychiatric/Behavioral:  Negative for confusion and sleep disturbance.     Objective:  BP 138/82 (BP Location: Left Arm, Patient Position: Sitting, Cuff Size: Normal)   Pulse 95   Temp 97.8 F (36.6 C) (Oral)   Ht '5\' 8"'$  (1.727 m)   Wt 171 lb (77.6 kg)   SpO2 99%   BMI 26.00 kg/m   BP Readings from Last 3 Encounters:  02/21/22 138/82  11/08/21 (!) 171/91  08/10/21 120/80    Wt Readings from Last 3 Encounters:  02/21/22 171 lb (77.6 kg)  11/08/21 166 lb (75.3 kg)  08/10/21 161 lb (73 kg)    Physical Exam Constitutional:      General: She is not in acute distress.    Appearance: Normal appearance. She is well-developed.  HENT:     Head: Normocephalic.     Right Ear: External ear normal.     Left Ear: External ear normal.     Nose: Nose normal.  Eyes:     General:        Right eye: No discharge.        Left eye: No discharge.     Conjunctiva/sclera: Conjunctivae normal.     Pupils: Pupils are equal, round, and reactive to light.  Neck:     Thyroid: No thyromegaly.     Vascular: No JVD.     Trachea: No tracheal deviation.  Cardiovascular:     Rate and Rhythm: Normal rate  and regular rhythm.     Heart sounds: Normal heart sounds.  Pulmonary:     Effort: No respiratory distress.     Breath sounds: No stridor. No wheezing.  Abdominal:     General: Bowel sounds are normal. There is no distension.     Palpations: Abdomen is soft. There is no mass.     Tenderness: There is no abdominal tenderness. There is no guarding or rebound.  Musculoskeletal:        General: No tenderness.     Cervical back: Normal range of motion and neck supple. No rigidity.  Lymphadenopathy:     Cervical: No cervical adenopathy.  Skin:    Findings: No erythema or rash.  Neurological:     Cranial Nerves: No cranial nerve deficit.     Motor: No abnormal muscle tone.     Coordination: Coordination normal.     Deep Tendon Reflexes: Reflexes normal.  Psychiatric:         Behavior: Behavior normal.        Thought Content: Thought content normal.        Judgment: Judgment normal.     Lab Results  Component Value Date   WBC 14.2 (H) 07/03/2015   HGB 11.0 (L) 07/03/2015   HCT 35.0 (L) 07/03/2015   PLT 233 07/03/2015   GLUCOSE 103 (H) 07/03/2015   CHOL 152 10/27/2010   TRIG 35.0 10/27/2010   HDL 50.80 10/27/2010   LDLCALC 94 10/27/2010   ALT 11 (L) 07/03/2015   AST 15 07/03/2015   NA 140 07/03/2015   K 3.6 07/03/2015   CL 103 07/03/2015   CREATININE 0.56 07/03/2015   BUN 5 (L) 07/03/2015   CO2 26 07/03/2015   TSH 0.659 06/28/2015   INR 1.10 06/30/2015   HGBA1C 5.9 (H) 10/14/2012    No results found.  Assessment & Plan:   Problem List Items Addressed This Visit       Other   Well adult exam - Primary    We discussed age appropriate health related issues, including available/recomended screening tests and vaccinations. Labs were ordered to be later reviewed . All questions were answered. We discussed one or more of the following - seat belt use, use of sunscreen/sun exposure exercise, safe sex, fall risk reduction, second hand smoke exposure, firearm use and storage, seat belt use, a need for adhering to healthy diet and exercise. Labs with rheumatology; all questions were answered.      Cough    Chronic cough.  Tussionex as needed  Potential benefits of a short/long term opioids use as well as potential risks (i.e. addiction risk, apnea etc) and complications (i.e. Somnolence, constipation and others) were explained to the patient and were aknowledged.          Meds ordered this encounter  Medications   amLODipine (NORVASC) 10 MG tablet    Sig: TAKE 1 TABLET(10 MG) BY MOUTH DAILY    Dispense:  90 tablet    Refill:  3   ondansetron (ZOFRAN) 4 MG tablet    Sig: Take 1 tablet (4 mg total) by mouth every 8 (eight) hours as needed for nausea or vomiting.    Dispense:  20 tablet    Refill:  1   valACYclovir (VALTREX) 500 MG  tablet    Sig: TAKE 1 TABLET(500 MG) BY MOUTH TWICE DAILY AS NEEDED    Dispense:  14 tablet    Refill:  2   chlorpheniramine-HYDROcodone (TUSSIONEX) 10-8 MG/5ML  Sig: Take 5 mLs by mouth every 12 (twelve) hours as needed for cough.    Dispense:  115 mL    Refill:  0      Follow-up: No follow-ups on file.  Walker Kehr, MD

## 2022-02-26 ENCOUNTER — Encounter: Payer: Self-pay | Admitting: Internal Medicine

## 2022-02-26 NOTE — Assessment & Plan Note (Signed)
We discussed age appropriate health related issues, including available/recomended screening tests and vaccinations. Labs were ordered to be later reviewed . All questions were answered. We discussed one or more of the following - seat belt use, use of sunscreen/sun exposure exercise, safe sex, fall risk reduction, second hand smoke exposure, firearm use and storage, seat belt use, a need for adhering to healthy diet and exercise. Labs with rheumatology; all questions were answered.

## 2022-02-26 NOTE — Assessment & Plan Note (Signed)
Chronic cough.  Tussionex as needed  Potential benefits of a short/long term opioids use as well as potential risks (i.e. addiction risk, apnea etc) and complications (i.e. Somnolence, constipation and others) were explained to the patient and were aknowledged.

## 2022-05-02 ENCOUNTER — Other Ambulatory Visit: Payer: Self-pay | Admitting: Internal Medicine

## 2022-05-16 ENCOUNTER — Telehealth: Payer: Self-pay | Admitting: Internal Medicine

## 2022-05-16 NOTE — Telephone Encounter (Signed)
Prescription Request  05/16/2022  LOV: 02/21/2022  What is the name of the medication or equipment?   Hydrochlorothiazide (MICROZIDE) 12.5 MG   (Medication showing as discontinued, PT stated that Dr.Plotnikov had advised on taking this in the past alongside their amLODipine but she hadn't. She stated that she had been dealing with some more frequent swelling here recently and thought it would be good to start the [facility] again)  Have you contacted your pharmacy to request a refill? Yes   Which pharmacy would you like this sent to?   [facility] AT . [place] [address] Phone: [phone] Fax: [phone]   Patient notified that their request is being sent to the clinical staff for review and that they should receive a response within 2 business days.   Please advise at [facility] [phone]

## 2022-05-16 NOTE — Telephone Encounter (Signed)
Is this ok to refill. Pt never took when you rx.Marland KitchenJohny [name]

## 2022-05-17 MED ORDER — HYDROCHLOROTHIAZIDE 12.5 MG PO TABS: 12.5000 mg | ORAL_TABLET | Freq: Every day | ORAL | 0 refills | Status: DC

## 2022-05-17 NOTE — Telephone Encounter (Signed)
Sent refill to [facility]../lmb 

## 2022-05-17 NOTE — Telephone Encounter (Signed)
Okay to fill. Thank you

## 2022-08-14 ENCOUNTER — Other Ambulatory Visit: Payer: Self-pay | Admitting: Internal Medicine

## 2022-08-23 ENCOUNTER — Other Ambulatory Visit: Payer: Self-pay | Admitting: Obstetrics and Gynecology

## 2022-08-23 DIAGNOSIS — Z1231 Encounter for screening mammogram for malignant neoplasm of breast: Secondary | ICD-10-CM

## 2022-09-19 ENCOUNTER — Ambulatory Visit
Admission: RE | Admit: 2022-09-19 | Discharge: 2022-09-19 | Disposition: A | Discharge status: Home / Self Care | Payer: 59 | Source: Ambulatory Visit | Attending: Obstetrics and Gynecology | Admitting: Obstetrics and Gynecology

## 2022-09-19 DIAGNOSIS — Z1231 Encounter for screening mammogram for malignant neoplasm of breast: Secondary | ICD-10-CM

## 2023-04-02 ENCOUNTER — Other Ambulatory Visit: Payer: Self-pay | Admitting: Internal Medicine

## 2023-04-13 ENCOUNTER — Other Ambulatory Visit: Payer: Self-pay | Admitting: Internal Medicine

## 2023-04-19 ENCOUNTER — Ambulatory Visit: Payer: 59 | Admitting: Internal Medicine

## 2023-04-19 ENCOUNTER — Encounter: Payer: Self-pay | Admitting: Internal Medicine

## 2023-04-19 VITALS — BP 120/78 | HR 83 | Temp 98.2°F | Ht 68.0 in | Wt 178.0 lb

## 2023-04-19 DIAGNOSIS — D751 Secondary polycythemia: Secondary | ICD-10-CM | POA: Diagnosis not present

## 2023-04-19 DIAGNOSIS — Z0000 Encounter for general adult medical examination without abnormal findings: Secondary | ICD-10-CM

## 2023-04-19 DIAGNOSIS — M0520 Rheumatoid vasculitis with rheumatoid arthritis of unspecified site: Secondary | ICD-10-CM | POA: Diagnosis not present

## 2023-04-19 DIAGNOSIS — L959 Vasculitis limited to the skin, unspecified: Secondary | ICD-10-CM

## 2023-04-19 DIAGNOSIS — I10 Essential (primary) hypertension: Secondary | ICD-10-CM | POA: Diagnosis not present

## 2023-04-19 MED ORDER — AMLODIPINE BESYLATE 10 MG PO TABS: ORAL_TABLET | ORAL | 3 refills | Status: AC

## 2023-04-19 MED ORDER — HYDROCHLOROTHIAZIDE 12.5 MG PO TABS: 12.5000 mg | ORAL_TABLET | Freq: Every day | ORAL | 3 refills | Status: AC

## 2023-04-19 MED ORDER — VALACYCLOVIR HCL 500 MG PO TABS: ORAL_TABLET | ORAL | 2 refills | Status: AC

## 2023-04-19 MED ORDER — ONDANSETRON HCL 4 MG PO TABS: 4.0000 mg | ORAL_TABLET | Freq: Three times a day (TID) | ORAL | 1 refills | Status: AC | PRN

## 2023-04-19 NOTE — Assessment & Plan Note (Signed)
 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

## 2023-04-19 NOTE — Assessment & Plan Note (Signed)
 Mild Monitor CBC

## 2023-04-19 NOTE — Assessment & Plan Note (Signed)
 Cont on Amlodipine - reduce to 5 mg if edema SBP 130-140 at home

## 2023-04-19 NOTE — Progress Notes (Signed)
 Subjective:  Patient ID: Jamie [name], female    DOB: November 06, 1965  Age: 58 y.o. MRN: [id number]  CC: No chief complaint on file.   HPI Jamie [name] presents for HTN, edema, RA  Outpatient Medications Prior to Visit  Medication Sig Dispense Refill   alendronate (FOSAMAX) 70 MG tablet Take 70 mg by mouth once a week.     budesonide-formoterol (SYMBICORT) 160-4.5 MCG/ACT inhaler Inhale 2 puffs into the lungs 2 (two) times daily. 1 Inhaler 6   Cholecalciferol (VITAMIN D3) 1.25 MG (50000 UT) CAPS Take 1 capsule by mouth every 30 (thirty) days. 3 capsule 3   estradiol (VIVELLE-DOT) 0.1 MG/24HR patch 1 patch 2 (two) times a week.     hydroxychloroquine (PLAQUENIL) 200 MG tablet Take 300-400 mg by mouth every other day. Alternating between 300mg  and 400mg  daily.     MINIVELLE 0.075 MG/24HR   1   mycophenolate (CELLCEPT) 500 MG tablet Take 2 tablets by mouth 2 (two) times daily.     amLODipine (NORVASC) 10 MG tablet TAKE 1 TABLET(10 MG) BY MOUTH DAILY 90 tablet 3   chlorpheniramine-HYDROcodone (TUSSIONEX) 10-8 MG/5ML Take 5 mLs by mouth every 12 (twelve) hours as needed for cough. 115 mL 0   hydrochlorothiazide (HYDRODIURIL) 12.5 MG tablet TAKE 1 TABLET(12.5 MG) BY MOUTH DAILY 90 tablet 1   ondansetron (ZOFRAN) 4 MG tablet Take 1 tablet (4 mg total) by mouth every 8 (eight) hours as needed for nausea or vomiting. 20 tablet 1   valACYclovir (VALTREX) 500 MG tablet TAKE 1 TABLET(500 MG) BY MOUTH TWICE DAILY AS NEEDED 14 tablet 2   No facility-administered medications prior to visit.    ROS: Review of Systems  Constitutional:  Negative for activity change, appetite change, chills, fatigue and unexpected weight change.  HENT:  Negative for congestion, mouth sores and sinus pressure.   Eyes:  Negative for visual disturbance.  Respiratory:  Negative for cough and chest tightness.   Cardiovascular:  Positive for leg swelling.  Gastrointestinal:  Negative for abdominal pain and  nausea.  Genitourinary:  Negative for difficulty urinating, frequency and vaginal pain.  Musculoskeletal:  Negative for back pain and gait problem.  Skin:  Positive for color change. Negative for pallor and rash.  Neurological:  Negative for dizziness, tremors, weakness, numbness and headaches.  Psychiatric/Behavioral:  Negative for confusion, sleep disturbance and suicidal ideas.     Objective:  BP 120/78   Pulse 83   Temp 98.2 F (36.8 C) (Oral)   Ht 5\' 8"  (1.727 m)   Wt 178 lb (80.7 kg)   SpO2 99%   BMI 27.06 kg/m   BP Readings from Last 3 Encounters:  04/19/23 120/78  02/21/22 138/82  11/08/21 (!) 171/91    Wt Readings from Last 3 Encounters:  04/19/23 178 lb (80.7 kg)  02/21/22 171 lb (77.6 kg)  11/08/21 166 lb (75.3 kg)    Physical Exam Constitutional:      General: She is not in acute distress.    Appearance: She is well-developed.  HENT:     Head: Normocephalic.     Right Ear: External ear normal.     Left Ear: External ear normal.     Nose: Nose normal.  Eyes:     General:        Right eye: No discharge.        Left eye: No discharge.     Conjunctiva/sclera: Conjunctivae normal.     Pupils: Pupils are equal, round, and  reactive to light.  Neck:     Thyroid: No thyromegaly.     Vascular: No JVD.     Trachea: No tracheal deviation.  Cardiovascular:     Rate and Rhythm: Normal rate and regular rhythm.     Heart sounds: Normal heart sounds.  Pulmonary:     Effort: No respiratory distress.     Breath sounds: No stridor. No wheezing.  Abdominal:     General: Bowel sounds are normal. There is no distension.     Palpations: Abdomen is soft. There is no mass.     Tenderness: There is no abdominal tenderness. There is no guarding or rebound.  Musculoskeletal:        General: No tenderness.     Cervical back: Normal range of motion and neck supple. No rigidity.     Right lower leg: Edema present.     Left lower leg: Edema present.  Lymphadenopathy:      Cervical: No cervical adenopathy.  Skin:    Findings: No erythema or rash.  Neurological:     Mental Status: She is oriented to person, place, and time.     Cranial Nerves: No cranial nerve deficit.     Motor: No abnormal muscle tone.     Coordination: Coordination normal.     Deep Tendon Reflexes: Reflexes normal.  Psychiatric:        Behavior: Behavior normal.        Thought Content: Thought content normal.        Judgment: Judgment normal.   Trace edema, hyperpigm skin on ankles  Lab Results  Component Value Date   WBC 14.2 (H) 07/03/2015   HGB 11.0 (L) 07/03/2015   HCT 35.0 (L) 07/03/2015   PLT 233 07/03/2015   GLUCOSE 103 (H) 07/03/2015   CHOL 152 10/27/2010   TRIG 35.0 10/27/2010   HDL 50.80 10/27/2010   LDLCALC 94 10/27/2010   ALT 11 (L) 07/03/2015   AST 15 07/03/2015   NA 140 07/03/2015   K 3.6 07/03/2015   CL 103 07/03/2015   CREATININE 0.56 07/03/2015   BUN 5 (L) 07/03/2015   CO2 26 07/03/2015   TSH 0.659 06/28/2015   INR 1.10 06/30/2015   HGBA1C 5.9 (H) 10/14/2012    MM 3D SCREENING MAMMOGRAM BILATERAL BREAST Result Date: 09/20/2022 CLINICAL DATA:  Screening. EXAM: DIGITAL SCREENING BILATERAL MAMMOGRAM WITH TOMOSYNTHESIS AND CAD TECHNIQUE: Bilateral screening digital craniocaudal and mediolateral oblique mammograms were obtained. Bilateral screening digital breast tomosynthesis was performed. The images were evaluated with computer-aided detection. COMPARISON:  Previous exam(s). ACR Breast Density Category d: The breasts are extremely dense, which lowers the sensitivity of mammography. FINDINGS: There are no findings suspicious for malignancy. IMPRESSION: No mammographic evidence of malignancy. A result letter of this screening mammogram will be mailed directly to the patient. RECOMMENDATION: Screening mammogram in one year. (Code:SM-B-01Y) BI-RADS CATEGORY  1: Negative. Electronically Signed   By: Elberta Fortis M.D.   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    Dispense:  90 tablet    Refill:  3   amLODipine (NORVASC) 10 MG tablet    Sig: TAKE 1 TABLET(10 MG) BY MOUTH DAILY    Dispense:  90 tablet    Refill:  3   ondansetron (ZOFRAN) 4 MG tablet    Sig: Take 1 tablet (4 mg total) by mouth every 8 (eight) hours as needed for nausea or vomiting.    Dispense:  20 tablet    Refill:  1   valACYclovir (VALTREX) 500 MG tablet    Sig: TAKE 1 TABLET(500 MG) BY MOUTH TWICE DAILY AS NEEDED    Dispense:  14 tablet    Refill:  2      Follow-up: Return in about 6 months (around 10/20/2023) for Wellness Exam.  Sonda Primes, MD

## 2023-04-19 NOTE — Assessment & Plan Note (Signed)
 On Plaquenil

## 2023-07-20 ENCOUNTER — Telehealth: Payer: Self-pay | Admitting: Internal Medicine

## 2023-07-20 NOTE — Telephone Encounter (Signed)
 Copied from CRM #[id number]. Topic: Clinical - Request for Lab/Test Order >> Jul 20, 2023 10:00 AM Marlan Silva wrote: Reason for CRM: Patient is requesting a imaging order be placed due to pain in her left leg, patient states the pain is in her thigh or hip area. Patient states that she would have came to see the doctor but he does not have anything until mid June. Patient is requesting a call back at number on file once referral is sent.

## 2023-07-25 NOTE — Telephone Encounter (Signed)
 Please schedule an office visit with any available provider.  Jamie [name] needs to be seen first.  Thank you

## 2023-07-31 ENCOUNTER — Encounter: Payer: Self-pay | Admitting: Internal Medicine

## 2023-07-31 ENCOUNTER — Ambulatory Visit: Admitting: Internal Medicine

## 2023-07-31 VITALS — BP 130/80 | HR 95 | Temp 97.9°F | Ht 68.0 in | Wt 170.0 lb

## 2023-07-31 DIAGNOSIS — M25552 Pain in left hip: Secondary | ICD-10-CM | POA: Diagnosis not present

## 2023-07-31 DIAGNOSIS — M0520 Rheumatoid vasculitis with rheumatoid arthritis of unspecified site: Secondary | ICD-10-CM

## 2023-07-31 MED ORDER — HYDROCODONE-ACETAMINOPHEN 5-325 MG PO TABS: 1.0000 | ORAL_TABLET | Freq: Four times a day (QID) | ORAL | 0 refills | Status: DC | PRN

## 2023-07-31 MED ORDER — PREDNISONE 10 MG PO TABS: ORAL_TABLET | ORAL | 1 refills | Status: DC

## 2023-07-31 NOTE — Assessment & Plan Note (Signed)
 Severe L groin crease stabbing pain x 3 wks L hip and L knee X ray - 1.  No acute fracture.  2.  No dislocation.  3.  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  On 07/13/23 R/o avascular necrosis etc MRI L hip Norco prn Prednisone  10 mg: take 4 tabs a day x 3 days; then 3 tabs a day x 4 days; then 2 tabs a day x 4 days, then 1 tab a day x 6 days, then stop. Take pc.

## 2023-07-31 NOTE — Assessment & Plan Note (Signed)
 No change

## 2023-07-31 NOTE — Progress Notes (Signed)
 Subjective:  Patient ID: Jamie [name], female    DOB: 1966/01/20  Age: 58 y.o. MRN: [id number]  CC: Acute Visit (3weeks leg pain walk is worsening ...xray show mild arthritis and tried pain meds hasn't helping..... )   HPI Jamie [name] presents for L groin crease stabbing pain x 3 wks L hip and L knee X ray - 1.  No acute fracture.  2.  No dislocation.  3.  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  On 07/13/23  Dose pack did not work   Outpatient Medications Prior to Visit  Medication Sig Dispense Refill   alendronate (FOSAMAX) 70 MG tablet Take 70 mg by mouth once a week.     amLODipine  (NORVASC ) 10 MG tablet TAKE 1 TABLET(10 MG) BY MOUTH DAILY 90 tablet 3   budesonide -formoterol  (SYMBICORT ) 160-4.5 MCG/ACT inhaler Inhale 2 puffs into the lungs 2 (two) times daily. 1 Inhaler 6   Cholecalciferol (VITAMIN D3) 1.25 MG (50000 UT) CAPS Take 1 capsule by mouth every 30 (thirty) days. 3 capsule 3   estradiol (VIVELLE-DOT) 0.1 MG/24HR patch 1 patch 2 (two) times a week.     hydrochlorothiazide  (HYDRODIURIL ) 12.5 MG tablet Take 1 tablet (12.5 mg total) by mouth daily. 90 tablet 3   hydroxychloroquine  (PLAQUENIL ) 200 MG tablet Take 300-400 mg by mouth every other day. Alternating between 300mg  and 400mg  daily.     MINIVELLE 0.075 MG/24HR   1   mycophenolate  (CELLCEPT ) 500 MG tablet Take 2 tablets by mouth 2 (two) times daily.     ondansetron  (ZOFRAN ) 4 MG tablet Take 1 tablet (4 mg total) by mouth every 8 (eight) hours as needed for nausea or vomiting. 20 tablet 1   valACYclovir  (VALTREX ) 500 MG tablet TAKE 1 TABLET(500 MG) BY MOUTH TWICE DAILY AS NEEDED 14 tablet 2   No facility-administered medications prior to visit.    ROS: Review of Systems  Constitutional:  Negative for activity change, appetite change, chills, fatigue and unexpected weight change.  HENT:  Negative for congestion, mouth sores and sinus pressure.   Eyes:  Negative for visual  disturbance.  Respiratory:  Negative for cough and chest tightness.   Gastrointestinal:  Negative for abdominal pain and nausea.  Genitourinary:  Negative for difficulty urinating, frequency and vaginal pain.  Musculoskeletal:  Positive for arthralgias and gait problem. Negative for back pain.  Skin:  Negative for pallor and rash.  Neurological:  Negative for dizziness, tremors, weakness, numbness and headaches.  Hematological:  Does not bruise/bleed easily.  Psychiatric/Behavioral:  Negative for confusion, sleep disturbance and suicidal ideas.     Objective:  BP 130/80   Pulse 95   Temp 97.9 F (36.6 C)   Ht 5' 8 (1.727 m)   Wt 170 lb (77.1 kg)   SpO2 97%   BMI 25.85 kg/m   BP Readings from Last 3 Encounters:  07/31/23 130/80  04/19/23 120/78  02/21/22 138/82    Wt Readings from Last 3 Encounters:  07/31/23 170 lb (77.1 kg)  04/19/23 178 lb (80.7 kg)  02/21/22 171 lb (77.6 kg)    Physical Exam Constitutional:      General: She is not in acute distress.    Appearance: She is well-developed.  HENT:     Head: Normocephalic.     Right Ear: External ear normal.     Left Ear: External ear normal.     Nose: Nose normal.   Eyes:     General:  Right eye: No discharge.        Left eye: No discharge.     Conjunctiva/sclera: Conjunctivae normal.     Pupils: Pupils are equal, round, and reactive to light.   Neck:     Thyroid : No thyromegaly.     Vascular: No JVD.     Trachea: No tracheal deviation.   Cardiovascular:     Rate and Rhythm: Normal rate and regular rhythm.     Heart sounds: Normal heart sounds.  Pulmonary:     Effort: No respiratory distress.     Breath sounds: No stridor. No wheezing.  Abdominal:     General: Bowel sounds are normal. There is no distension.     Palpations: Abdomen is soft. There is no mass.     Tenderness: There is no abdominal tenderness. There is no guarding or rebound.   Musculoskeletal:        General: Tenderness  present.     Cervical back: Normal range of motion and neck supple. No rigidity.  Lymphadenopathy:     Cervical: No cervical adenopathy.   Skin:    Findings: No erythema or rash.   Neurological:     Cranial Nerves: No cranial nerve deficit.     Motor: No abnormal muscle tone.     Coordination: Coordination normal.     Gait: Gait abnormal.     Deep Tendon Reflexes: Reflexes normal.   Psychiatric:        Behavior: Behavior normal.        Thought Content: Thought content normal.        Judgment: Judgment normal.   L hip is very tender w/ROM Liming No rash  Lab Results  Component Value Date   WBC 14.2 (H) 07/03/2015   HGB 11.0 (L) 07/03/2015   HCT 35.0 (L) 07/03/2015   PLT 233 07/03/2015   GLUCOSE 103 (H) 07/03/2015   CHOL 152 10/27/2010   TRIG 35.0 10/27/2010   HDL 50.80 10/27/2010   LDLCALC 94 10/27/2010   ALT 11 (L) 07/03/2015   AST 15 07/03/2015   NA 140 07/03/2015   K 3.6 07/03/2015   CL 103 07/03/2015   CREATININE 0.56 07/03/2015   BUN 5 (L) 07/03/2015   CO2 26 07/03/2015   TSH 0.659 06/28/2015   INR 1.10 06/30/2015   HGBA1C 5.9 (H) 10/14/2012    MM 3D SCREENING MAMMOGRAM BILATERAL BREAST Result Date: 09/20/2022 CLINICAL DATA:  Screening. EXAM: DIGITAL SCREENING BILATERAL MAMMOGRAM WITH TOMOSYNTHESIS AND CAD TECHNIQUE: Bilateral screening digital craniocaudal and mediolateral oblique mammograms were obtained. Bilateral screening digital breast tomosynthesis was performed. The images were evaluated with computer-aided detection. COMPARISON:  Previous exam(s). ACR Breast Density Category d: The breasts are extremely dense, which lowers the sensitivity of mammography. FINDINGS: There are no findings suspicious for malignancy. IMPRESSION: No mammographic evidence of malignancy. A result letter of this screening mammogram will be mailed directly to the patient. RECOMMENDATION: Screening mammogram in one year. (Code:SM-B-01Y) BI-RADS CATEGORY  1: Negative. Electronically  Signed   By: Roda Cirri M.D.   On: 09/20/2022 13:46    Assessment & Plan:   Problem List Items Addressed This Visit     Hip pain, acute, left - Primary   Severe L groin crease stabbing pain x 3 wks L hip and L knee X ray - 1.  No acute fracture.  2.  No dislocation.  3.  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  On 07/13/23 R/o avascular necrosis etc MRI L  hip Norco prn Prednisone  10 mg: take 4 tabs a day x 3 days; then 3 tabs a day x 4 days; then 2 tabs a day x 4 days, then 1 tab a day x 6 days, then stop. Take pc.         Relevant Orders   MR HIP LEFT WO CONTRAST   Rheumatoid arteritis (HCC)   No change      Relevant Medications   HYDROcodone -acetaminophen  (NORCO/VICODIN) 5-325 MG tablet   predniSONE  (DELTASONE ) 10 MG tablet   Other Relevant Orders   MR HIP LEFT WO CONTRAST      Meds ordered this encounter  Medications   HYDROcodone -acetaminophen  (NORCO/VICODIN) 5-325 MG tablet    Sig: Take 1 tablet by mouth every 6 (six) hours as needed for severe pain (pain score 7-10).    Dispense:  20 tablet    Refill:  0   predniSONE  (DELTASONE ) 10 MG tablet    Sig: Prednisone  10 mg: take 4 tabs a day x 3 days; then 3 tabs a day x 4 days; then 2 tabs a day x 4 days, then 1 tab a day x 6 days, then stop. Take pc.    Dispense:  38 tablet    Refill:  1      Follow-up: Return in about 1 week (around 08/07/2023) for a follow-up visit.  Anitra Barn, MD

## 2023-08-02 ENCOUNTER — Ambulatory Visit
Admission: RE | Admit: 2023-08-02 | Discharge: 2023-08-02 | Discharge status: Home / Self Care | Source: Ambulatory Visit | Attending: Internal Medicine | Admitting: Internal Medicine

## 2023-08-02 DIAGNOSIS — M0520 Rheumatoid vasculitis with rheumatoid arthritis of unspecified site: Secondary | ICD-10-CM

## 2023-08-02 DIAGNOSIS — M25552 Pain in left hip: Secondary | ICD-10-CM

## 2023-08-03 ENCOUNTER — Ambulatory Visit: Payer: Self-pay | Admitting: Internal Medicine

## 2023-08-03 DIAGNOSIS — M25552 Pain in left hip: Secondary | ICD-10-CM

## 2023-08-03 NOTE — Telephone Encounter (Signed)
 Shelagh Derrick, Please inform the patient to look at my message from today.  Will have to refer Jamie [name] to see an orthopedic surgeon.  Thanks, AP

## 2023-08-09 ENCOUNTER — Ambulatory Visit
Admission: RE | Admit: 2023-08-09 | Discharge: 2023-08-09 | Disposition: A | Discharge status: Home / Self Care | Source: Ambulatory Visit | Attending: Family Medicine | Admitting: Family Medicine

## 2023-08-09 ENCOUNTER — Other Ambulatory Visit: Payer: Self-pay | Admitting: Family Medicine

## 2023-08-09 DIAGNOSIS — M25552 Pain in left hip: Secondary | ICD-10-CM

## 2023-10-09 ENCOUNTER — Other Ambulatory Visit: Payer: Self-pay | Admitting: Obstetrics and Gynecology

## 2023-10-09 DIAGNOSIS — Z1231 Encounter for screening mammogram for malignant neoplasm of breast: Secondary | ICD-10-CM

## 2023-10-13 ENCOUNTER — Ambulatory Visit
Admission: EM | Admit: 2023-10-13 | Discharge: 2023-10-13 | Disposition: A | Discharge status: Home / Self Care | Attending: Family Medicine | Admitting: Family Medicine

## 2023-10-13 DIAGNOSIS — N3000 Acute cystitis without hematuria: Secondary | ICD-10-CM | POA: Diagnosis present

## 2023-10-13 DIAGNOSIS — R300 Dysuria: Secondary | ICD-10-CM | POA: Insufficient documentation

## 2023-10-13 LAB — POCT URINE DIPSTICK
Blood, UA: NEGATIVE
Glucose, UA: 100 mg/dL — AB
Nitrite, UA: POSITIVE — AB
POC PROTEIN,UA: 30 — AB
Spec Grav, UA: >=1.03 — AB (ref 1.010–1.025)
Urobilinogen, UA: 1 U/dL
pH, UA: 5 (ref 5.0–8.0)

## 2023-10-13 MED ORDER — CEPHALEXIN 500 MG PO CAPS: 500.0000 mg | ORAL_CAPSULE | Freq: Two times a day (BID) | ORAL | 0 refills | Status: AC

## 2023-10-13 NOTE — Discharge Instructions (Addendum)

## 2023-10-13 NOTE — ED Triage Notes (Signed)
 Pt c/o mild lower back pain, dysuria, foul smelling urine and freq x 3 days-NAD-steady gait

## 2023-10-13 NOTE — ED Provider Notes (Signed)
 [facility]  Note:  This document was prepared using Conservation officer, historic buildings and may include unintentional dictation errors.  MRN: [id number] DOB: 11-18-1965  Subjective:   Jamie [name] is a 58 y.o. female presenting for 3-day history of dysuria, malodorous urine, urinary frequency and urgency, low back pain.  Has not been hydrating well and does sweet tea regularly.  No current facility-administered medications for this encounter.  Current Outpatient Medications:    alendronate (FOSAMAX) 70 MG tablet, Take 70 mg by mouth once a week., Disp: , Rfl:    amLODipine  (NORVASC ) 10 MG tablet, TAKE 1 TABLET(10 MG) BY MOUTH DAILY, Disp: 90 tablet, Rfl: 3   budesonide -formoterol  (SYMBICORT ) 160-4.5 MCG/ACT inhaler, Inhale 2 puffs into the lungs 2 (two) times daily., Disp: 1 Inhaler, Rfl: 6   Cholecalciferol (VITAMIN D3) 1.25 MG (50000 UT) CAPS, Take 1 capsule by mouth every 30 (thirty) days., Disp: 3 capsule, Rfl: 3   estradiol (VIVELLE-DOT) 0.1 MG/24HR patch, 1 patch 2 (two) times a week., Disp: , Rfl:    hydrochlorothiazide  (HYDRODIURIL ) 12.5 MG tablet, Take 1 tablet (12.5 mg total) by mouth daily., Disp: 90 tablet, Rfl: 3   HYDROcodone -acetaminophen  (NORCO/VICODIN) 5-325 MG tablet, Take 1 tablet by mouth every 6 (six) hours as needed for severe pain (pain score 7-10)., Disp: 20 tablet, Rfl: 0   hydroxychloroquine  (PLAQUENIL ) 200 MG tablet, Take 300-400 mg by mouth every other day. Alternating between 300mg  and 400mg  daily., Disp: , Rfl:    MINIVELLE 0.075 MG/24HR, , Disp: , Rfl: 1   mycophenolate  (CELLCEPT ) 500 MG tablet, Take 2 tablets by mouth 2 (two) times daily., Disp: , Rfl:    ondansetron  (ZOFRAN ) 4 MG tablet, Take 1 tablet (4 mg total) by mouth every 8 (eight) hours as needed for nausea or vomiting., Disp: 20 tablet, Rfl: 1   predniSONE  (DELTASONE ) 10 MG tablet, Prednisone  10 mg: take 4 tabs a day x 3 days; then 3 tabs a day x 4 days; then 2 tabs a  day x 4 days, then 1 tab a day x 6 days, then stop. Take pc., Disp: 38 tablet, Rfl: 1   valACYclovir  (VALTREX ) 500 MG tablet, TAKE 1 TABLET(500 MG) BY MOUTH TWICE DAILY AS NEEDED, Disp: 14 tablet, Rfl: 2   Allergies  Allergen Reactions   Benazepril Hcl     REACTION: cough   Benicar  [Olmesartan ]     HA   Tramadol     n/v   Ciprofloxacin Rash    Joint pain REACTION: Rhabdomyolysis    Past Medical History:  Diagnosis Date   Dysrhythmia    ST/notes 12/10/2012   History of cold sores    Hypertension    Multiple thyroid  nodules    Photosensitivity 04/17/2012   3/14 new    Pneumomediastinum (HCC)    Pneumomediastinum in the setting of progressive nodular pulmonary infiltrates with dermatologic vasculitis/notes 12/10/2012   Pneumomediastinum (HCC) 03/03/2013   Pneumonia 02/2012   once (12/10/2012)   Pulmonary nodules/lesions, multiple 10/13/2012   Rheumatoid arthritis (HCC)    legs; arms (12/10/2012)   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      Past Surgical History:  Procedure Laterality Date   ABDOMINAL HYSTERECTOMY  ~ 2006   LUNG BIOPSY Left 10/18/2012   Procedure: LUNG BIOPSY;  Surgeon: Sudie VEAR Laine, MD;  Location: [facility] OR;  Service: Thoracic;  Laterality: Left;   VIDEO  ASSISTED THORACOSCOPY Left 10/18/2012   Procedure: VIDEO ASSISTED THORACOSCOPY;  Surgeon: Sudie VEAR Laine, MD;  Location: [facility] OR;  Service: Thoracic;  Laterality: Left;   VIDEO ASSISTED THORACOSCOPY (VATS)/DECORTICATION Left 07/01/2015   Procedure: VIDEO ASSISTED THORACOSCOPY (VATS)/DECORTICATION;  Surgeon: Elspeth JAYSON Millers, MD;  Location: [facility] OR;  Service: Thoracic;  Laterality: Left;   VIDEO BRONCHOSCOPY Left 10/18/2012   Procedure: VIDEO BRONCHOSCOPY;  Surgeon: Sudie VEAR Laine, MD;  Location: [facility] OR;  Service: Thoracic;  Laterality: Left;    Family History  Problem Relation Age of Onset   Hypertension Mother     Cancer Father 61       prostate ca   Hypertension Other     Social History   Tobacco Use   Smoking status: Never   Smokeless tobacco: Never  Vaping Use   Vaping status: Never Used  Substance Use Topics   Alcohol use: No   Drug use: No    ROS   Objective:   Vitals: BP (!) 157/93 (BP Location: Left Arm)   Pulse 88   Temp 97.6 F (36.4 C) (Oral)   Resp 19   SpO2 97%   BP Readings from Last 3 Encounters:  10/13/23 (!) 157/93  07/31/23 130/80  04/19/23 120/78   Physical Exam Constitutional:      General: She is not in acute distress.    Appearance: Normal appearance. She is well-developed. She is not ill-appearing, toxic-appearing or diaphoretic.  HENT:     Head: Normocephalic and atraumatic.     Nose: Nose normal.     Mouth/Throat:     Mouth: Mucous membranes are moist.  Eyes:     General: No scleral icterus.       Right eye: No discharge.        Left eye: No discharge.     Extraocular Movements: Extraocular movements intact.     Conjunctiva/sclera: Conjunctivae normal.  Cardiovascular:     Rate and Rhythm: Normal rate.  Pulmonary:     Effort: Pulmonary effort is normal.  Abdominal:     General: Bowel sounds are normal. There is no distension.     Palpations: Abdomen is soft. There is no mass.     Tenderness: There is no abdominal tenderness. There is no right CVA tenderness, left CVA tenderness, guarding or rebound.  Musculoskeletal:     Lumbar back: No swelling, edema, deformity, signs of trauma, lacerations, spasms, tenderness or bony tenderness. Normal range of motion. Negative right straight leg raise test and negative left straight leg raise test. No scoliosis.  Skin:    General: Skin is warm and dry.  Neurological:     General: No focal deficit present.     Mental Status: She is alert and oriented to person, place, and time.  Psychiatric:        Mood and Affect: Mood normal.        Behavior: Behavior normal.        Thought Content: Thought  content normal.        Judgment: Judgment normal.     Results for orders placed or performed during the hospital encounter of 10/13/23 (from the past 24 hours)  POCT URINE DIPSTICK     Status: Abnormal   Collection Time: 10/13/23  2:45 PM  Result Value Ref Range   Color, UA orange (A) yellow   Clarity, UA hazy (A) clear   Glucose, UA =100 (A) negative mg/dL   Bilirubin, UA small (A) negative   Ketones, POC  UA trace (5) (A) negative mg/dL   Spec Grav, UA >=8.969 (A) 1.010 - 1.025   Blood, UA negative negative   pH, UA 5.0 5.0 - 8.0   POC PROTEIN,UA =30 (A) negative, trace   Urobilinogen, UA 1.0 0.2 or 1.0 E.U./dL   Nitrite, UA Positive (A) Negative   Leukocytes, UA Trace (A) Negative    Assessment and Plan :   PDMP not reviewed this encounter.  1. Acute cystitis without hematuria   2. Dysuria     Start cephalexin  to cover for acute cystitis, urine culture pending.  Recommended aggressive hydration, limiting urinary irritants. Counseled patient on potential for adverse effects with medications prescribed/recommended today, ER and return-to-clinic precautions discussed, patient verbalized understanding.    Christopher Savannah, [place] 10/13/23 1452

## 2023-10-15 LAB — URINE CULTURE: Culture: >=100000 — AB

## 2023-10-16 ENCOUNTER — Ambulatory Visit (HOSPITAL_COMMUNITY): Payer: Self-pay

## 2023-10-23 ENCOUNTER — Ambulatory Visit: Admitting: Internal Medicine

## 2023-10-23 ENCOUNTER — Encounter: Payer: Self-pay | Admitting: Internal Medicine

## 2023-10-23 VITALS — BP 130/90 | HR 86 | Temp 97.7°F | Ht 67.0 in | Wt 177.2 lb

## 2023-10-23 DIAGNOSIS — M87052 Idiopathic aseptic necrosis of left femur: Secondary | ICD-10-CM

## 2023-10-23 DIAGNOSIS — I10 Essential (primary) hypertension: Secondary | ICD-10-CM | POA: Diagnosis not present

## 2023-10-23 DIAGNOSIS — M0520 Rheumatoid vasculitis with rheumatoid arthritis of unspecified site: Secondary | ICD-10-CM | POA: Diagnosis not present

## 2023-10-23 NOTE — Progress Notes (Signed)
 Subjective:  Patient ID: Jamie [name], female    DOB: 17-Sep-1965  Age: 58 y.o. MRN: [id number]  CC: Annual Exam   HPI Jamie [name] presents for L hip AVN, pain - seeing Dr Edna Beers F/u HTN, osteoporosis  Outpatient Medications Prior to Visit  Medication Sig Dispense Refill   amLODipine  (NORVASC ) 10 MG tablet TAKE 1 TABLET(10 MG) BY MOUTH DAILY 90 tablet 3   Cholecalciferol (VITAMIN D3) 1.25 MG (50000 UT) CAPS Take 1 capsule by mouth every 30 (thirty) days. 3 capsule 3   estradiol (VIVELLE-DOT) 0.1 MG/24HR patch 1 patch 2 (two) times a week.     hydrochlorothiazide  (HYDRODIURIL ) 12.5 MG tablet Take 1 tablet (12.5 mg total) by mouth daily. 90 tablet 3   hydroxychloroquine  (PLAQUENIL ) 200 MG tablet Take 300-400 mg by mouth every other day. Alternating between 300mg  and 400mg  daily.     meloxicam (MOBIC) 15 MG tablet Take 15 mg by mouth daily.     MINIVELLE 0.075 MG/24HR   1   mycophenolate  (CELLCEPT ) 500 MG tablet Take 2 tablets by mouth 2 (two) times daily.     ondansetron  (ZOFRAN ) 4 MG tablet Take 1 tablet (4 mg total) by mouth every 8 (eight) hours as needed for nausea or vomiting. 20 tablet 1   valACYclovir  (VALTREX ) 500 MG tablet TAKE 1 TABLET(500 MG) BY MOUTH TWICE DAILY AS NEEDED 14 tablet 2   HYDROcodone -acetaminophen  (NORCO/VICODIN) 5-325 MG tablet Take 1 tablet by mouth every 6 (six) hours as needed for severe pain (pain score 7-10). 20 tablet 0   alendronate (FOSAMAX) 70 MG tablet Take 70 mg by mouth once a week. (Patient not taking: Reported on 10/23/2023)     budesonide -formoterol  (SYMBICORT ) 160-4.5 MCG/ACT inhaler Inhale 2 puffs into the lungs 2 (two) times daily. (Patient not taking: Reported on 10/23/2023) 1 Inhaler 6   cephALEXin  (KEFLEX ) 500 MG capsule Take 1 capsule (500 mg total) by mouth 2 (two) times daily. (Patient not taking: Reported on 10/23/2023) 10 capsule 0   predniSONE  (DELTASONE ) 10 MG tablet Prednisone  10 mg: take 4 tabs a day x 3 days; then 3  tabs a day x 4 days; then 2 tabs a day x 4 days, then 1 tab a day x 6 days, then stop. Take pc. (Patient not taking: Reported on 10/23/2023) 38 tablet 1   No facility-administered medications prior to visit.    ROS: Review of Systems  Constitutional:  Negative for activity change, appetite change, chills, fatigue and unexpected weight change.  HENT:  Negative for congestion, mouth sores and sinus pressure.   Eyes:  Negative for visual disturbance.  Respiratory:  Negative for cough and chest tightness.   Gastrointestinal:  Negative for abdominal pain and nausea.  Genitourinary:  Negative for difficulty urinating, frequency and vaginal pain.  Musculoskeletal:  Positive for arthralgias and gait problem. Negative for back pain.  Skin:  Negative for pallor and rash.  Neurological:  Negative for dizziness, tremors, weakness, numbness and headaches.  Psychiatric/Behavioral:  Negative for confusion and sleep disturbance.     Objective:  BP (!) 130/90   Pulse 86   Temp 97.7 F (36.5 C) (Oral)   Ht 5' 7 (1.702 m)   Wt 177 lb 3.2 oz (80.4 kg)   SpO2 97%   BMI 27.75 kg/m   BP Readings from Last 3 Encounters:  10/23/23 (!) 130/90  10/13/23 (!) 157/93  07/31/23 130/80    Wt Readings from Last 3 Encounters:  10/23/23 177 lb 3.2  oz (80.4 kg)  07/31/23 170 lb (77.1 kg)  04/19/23 178 lb (80.7 kg)    Physical Exam Constitutional:      General: She is not in acute distress.    Appearance: She is well-developed.  HENT:     Head: Normocephalic.     Right Ear: External ear normal.     Left Ear: External ear normal.     Nose: Nose normal.  Eyes:     General:        Right eye: No discharge.        Left eye: No discharge.     Conjunctiva/sclera: Conjunctivae normal.     Pupils: Pupils are equal, round, and reactive to light.  Neck:     Thyroid : No thyromegaly.     Vascular: No JVD.     Trachea: No tracheal deviation.  Cardiovascular:     Rate and Rhythm: Normal rate and regular  rhythm.     Heart sounds: Normal heart sounds.  Pulmonary:     Effort: No respiratory distress.     Breath sounds: No stridor. No wheezing.  Abdominal:     General: Bowel sounds are normal. There is no distension.     Palpations: Abdomen is soft. There is no mass.     Tenderness: There is no abdominal tenderness. There is no guarding or rebound.  Musculoskeletal:        General: Tenderness present.     Cervical back: Normal range of motion and neck supple. No rigidity.  Lymphadenopathy:     Cervical: No cervical adenopathy.  Skin:    Findings: No erythema or rash.  Neurological:     Cranial Nerves: No cranial nerve deficit.     Motor: No abnormal muscle tone.     Coordination: Coordination normal.     Deep Tendon Reflexes: Reflexes normal.  Psychiatric:        Behavior: Behavior normal.        Thought Content: Thought content normal.        Judgment: Judgment normal.     Lab Results  Component Value Date   WBC 14.2 (H) 07/03/2015   HGB 11.0 (L) 07/03/2015   HCT 35.0 (L) 07/03/2015   PLT 233 07/03/2015   GLUCOSE 103 (H) 07/03/2015   CHOL 152 10/27/2010   TRIG 35.0 10/27/2010   HDL 50.80 10/27/2010   LDLCALC 94 10/27/2010   ALT 11 (L) 07/03/2015   AST 15 07/03/2015   NA 140 07/03/2015   K 3.6 07/03/2015   CL 103 07/03/2015   CREATININE 0.56 07/03/2015   BUN 5 (L) 07/03/2015   CO2 26 07/03/2015   TSH 0.659 06/28/2015   INR 1.10 06/30/2015   HGBA1C 5.9 (H) 10/14/2012    No results found.  Assessment & Plan:   Problem List Items Addressed This Visit     AVN of femur (HCC) - Primary   L hip AVN, pain - seeing Dr Edna Ortho Meloxicam helps  Potential benefits of a long term NSAID use as well as potential risks  and complications were explained to the patient and were aknowledged.        Essential hypertension   Cont on Amlodipine  - reduce to 5 mg if edema SBP 130-140 at home      Rheumatoid vasculitis (HCC) (Chronic)   F/u w/Dr Ang's office          No orders of the defined types were placed in this encounter.     Follow-up: Return  in about 4 months (around 02/22/2024) for Wellness Exam.  Marolyn Noel, MD

## 2023-10-23 NOTE — Patient Instructions (Signed)
 [employer] - [employer] Paediatric nurse, Building services engineer for [employer] - [employer] (Aluminum Alloy, Collapsible) Visit the [employer] 4.3 4.3 out of 5 stars    66 ratings [employer]'s Choice $27.99 with 7 percent savings-7% $27.99

## 2023-10-23 NOTE — Assessment & Plan Note (Signed)
 F/u w/Dr Reyna office

## 2023-10-23 NOTE — Assessment & Plan Note (Addendum)
 L hip AVN, pain - seeing Dr Edna Ortho Meloxicam helps  Potential benefits of a long term NSAID use as well as potential risks  and complications were explained to the patient and were aknowledged.

## 2023-10-23 NOTE — Assessment & Plan Note (Signed)
 Cont on Amlodipine - reduce to 5 mg if edema SBP 130-140 at home

## 2023-10-30 ENCOUNTER — Ambulatory Visit
Admission: RE | Admit: 2023-10-30 | Discharge: 2023-10-30 | Disposition: A | Discharge status: Home / Self Care | Source: Ambulatory Visit | Attending: Obstetrics and Gynecology | Admitting: Obstetrics and Gynecology

## 2023-10-30 DIAGNOSIS — Z1231 Encounter for screening mammogram for malignant neoplasm of breast: Secondary | ICD-10-CM

## 2024-02-01 ENCOUNTER — Telehealth: Payer: Self-pay | Admitting: Internal Medicine

## 2024-02-01 DIAGNOSIS — Z1211 Encounter for screening for malignant neoplasm of colon: Secondary | ICD-10-CM

## 2024-02-01 NOTE — Telephone Encounter (Signed)
 Copied from CRM #[id number]. Topic: Referral - Request for Referral >> Feb 01, 2024  9:15 AM Robinson H wrote: Did the patient discuss referral with their provider in the last year? Yes (If No - schedule appointment) (If Yes - send message)  Appointment offered? No  Type of order/referral and detailed reason for visit: Colonoscopy  Preference of office, provider, location: Dr. Belvie Just [address] [place] [phone]  If referral order, have you been seen by this specialty before? Yes, Dr. Milon passed away and has to go to new Gastro doctor and new office wants referral not insurance (If Yes, this issue or another issue? When? Where?  Can we respond through MyChart? No

## 2024-02-04 NOTE — Telephone Encounter (Signed)
 Okay. Thank you.

## 2024-02-04 NOTE — Addendum Note (Signed)
 Addended by: Arran Fessel V on: 02/04/2024 12:25 PM   Modules accepted: Orders

## 2024-02-21 ENCOUNTER — Telehealth: Payer: Self-pay

## 2024-02-21 NOTE — Telephone Encounter (Signed)
 Spoke to emergency spouse and gave contact information for [facility] referral as well as for gastro.

## 2024-02-25 ENCOUNTER — Ambulatory Visit: Admitting: Internal Medicine
